# Patient Record
Sex: Male | Born: 1965
Health system: Southern US, Community
[De-identification: ages and names within clinical notes are randomized; demographics above are authoritative.]

## PROBLEM LIST (undated history)

## (undated) DIAGNOSIS — I10 Essential (primary) hypertension: Secondary | ICD-10-CM

## (undated) DIAGNOSIS — J45909 Unspecified asthma, uncomplicated: Secondary | ICD-10-CM

## (undated) DIAGNOSIS — I639 Cerebral infarction, unspecified: Secondary | ICD-10-CM

## (undated) HISTORY — DX: Cerebral infarction, unspecified: I63.9

---

## 2006-11-15 ENCOUNTER — Emergency Department (HOSPITAL_COMMUNITY): Admission: EM | Admit: 2006-11-15 | Discharge: 2006-11-15 | Payer: Self-pay | Admitting: Family Medicine

## 2015-03-15 ENCOUNTER — Emergency Department (HOSPITAL_COMMUNITY)
Admission: EM | Admit: 2015-03-15 | Discharge: 2015-03-15 | Disposition: A | Discharge status: Home / Self Care | Payer: Self-pay | Attending: Emergency Medicine | Admitting: Emergency Medicine

## 2015-03-15 ENCOUNTER — Encounter (HOSPITAL_COMMUNITY): Payer: Self-pay | Admitting: *Deleted

## 2015-03-15 DIAGNOSIS — W57XXXA Bitten or stung by nonvenomous insect and other nonvenomous arthropods, initial encounter: Secondary | ICD-10-CM | POA: Insufficient documentation

## 2015-03-15 DIAGNOSIS — Z720 Tobacco use: Secondary | ICD-10-CM | POA: Insufficient documentation

## 2015-03-15 DIAGNOSIS — L03115 Cellulitis of right lower limb: Secondary | ICD-10-CM | POA: Insufficient documentation

## 2015-03-15 DIAGNOSIS — Y9289 Other specified places as the place of occurrence of the external cause: Secondary | ICD-10-CM | POA: Insufficient documentation

## 2015-03-15 DIAGNOSIS — Y998 Other external cause status: Secondary | ICD-10-CM | POA: Insufficient documentation

## 2015-03-15 DIAGNOSIS — Y9389 Activity, other specified: Secondary | ICD-10-CM | POA: Insufficient documentation

## 2015-03-15 DIAGNOSIS — L02415 Cutaneous abscess of right lower limb: Secondary | ICD-10-CM | POA: Insufficient documentation

## 2015-03-15 MED ORDER — LIDOCAINE-EPINEPHRINE (PF) 2 %-1:200000 IJ SOLN
20.0000 mL | Freq: Once | INTRAMUSCULAR | Status: AC
  Administered 2015-03-15: 20 mL
  Filled 2015-03-15: qty 20

## 2015-03-15 MED ORDER — IBUPROFEN 800 MG PO TABS: 800.0000 mg | ORAL_TABLET | Freq: Three times a day (TID) | ORAL | Status: DC | PRN

## 2015-03-15 MED ORDER — DOXYCYCLINE HYCLATE 100 MG PO CAPS: 100.0000 mg | ORAL_CAPSULE | Freq: Two times a day (BID) | ORAL | Status: DC

## 2015-03-15 NOTE — ED Notes (Signed)
Declined W/C at D/C and was escorted to lobby by RN. 

## 2015-03-15 NOTE — ED Notes (Signed)
PT reports bite to RT posterior calf . Pt reports bite occurred 3 weeks ago and has not improved. Pt has an open wound to Rt posterior calf.

## 2015-03-15 NOTE — Discharge Instructions (Signed)
Read the information below.  Use the prescribed medication as directed.  Please discuss all new medications with your pharmacist.  You may return to the Emergency Department at any time for worsening condition or any new symptoms that concern you.    If you develop increased redness, swelling, uncontrolled pain, or fevers greater than 100.4, return to the ER immediately for a recheck.     Abscess An abscess is an infected area that contains a collection of pus and debris.It can occur in almost any part of the body. An abscess is also known as a furuncle or boil. CAUSES  An abscess occurs when tissue gets infected. This can occur from blockage of oil or sweat glands, infection of hair follicles, or a minor injury to the skin. As the body tries to fight the infection, pus collects in the area and creates pressure under the skin. This pressure causes pain. People with weakened immune systems have difficulty fighting infections and get certain abscesses more often.  SYMPTOMS Usually an abscess develops on the skin and becomes a painful mass that is red, warm, and tender. If the abscess forms under the skin, you may feel a moveable soft area under the skin. Some abscesses break open (rupture) on their own, but most will continue to get worse without care. The infection can spread deeper into the body and eventually into the bloodstream, causing you to feel ill.  DIAGNOSIS  Your caregiver will take your medical history and perform a physical exam. A sample of fluid may also be taken from the abscess to determine what is causing your infection. TREATMENT  Your caregiver may prescribe antibiotic medicines to fight the infection. However, taking antibiotics alone usually does not cure an abscess. Your caregiver may need to make a small cut (incision) in the abscess to drain the pus. In some cases, gauze is packed into the abscess to reduce pain and to continue draining the area. HOME CARE INSTRUCTIONS   Only  take over-the-counter or prescription medicines for pain, discomfort, or fever as directed by your caregiver.  If you were prescribed antibiotics, take them as directed. Finish them even if you start to feel better.  If gauze is used, follow your caregiver's directions for changing the gauze.  To avoid spreading the infection:  Keep your draining abscess covered with a bandage.  Wash your hands well.  Do not share personal care items, towels, or whirlpools with others.  Avoid skin contact with others.  Keep your skin and clothes clean around the abscess.  Keep all follow-up appointments as directed by your caregiver. SEEK MEDICAL CARE IF:   You have increased pain, swelling, redness, fluid drainage, or bleeding.  You have muscle aches, chills, or a general ill feeling.  You have a fever. MAKE SURE YOU:   Understand these instructions.  Will watch your condition.  Will get help right away if you are not doing well or get worse.   This information is not intended to replace advice given to you by your health care provider. Make sure you discuss any questions you have with your health care provider.   Document Released: 02/04/2005 Document Revised: 10/27/2011 Document Reviewed: 07/10/2011 Elsevier Interactive Patient Education 2016 Else[employer] Inc[employer]  Incision and Drainage Incision and drainage is a procedure in which a sac-like structure (cystic structure) is opened and drained. The area to be drained usually contains material such as pus, fluid, or blood.  LET YOUR CAREGIVER KNOW ABOUT:   Allergies to medicine.  Medicines taken, including vitamins, herbs, eyedrops, over-the-counter medicines, and creams. °· Use of steroids (by mouth or creams). °· Previous problems with anesthetics or numbing medicines. °· History of bleeding problems or blood clots. °· Previous surgery. °· Other health problems, including diabetes and kidney problems. °· Possibility of pregnancy, if this  applies. °RISKS AND COMPLICATIONS °· Pain. °· Bleeding. °· Scarring. °· Infection. °BEFORE THE PROCEDURE  °You may need to have an ultrasound or other imaging tests to see how large or deep your cystic structure is. Blood tests may also be used to determine if you have an infection or how severe the infection is. You may need to have a tetanus shot. °PROCEDURE  °The affected area is cleaned with a cleaning fluid. The cyst area will then be numbed with a medicine (local anesthetic). A small incision will be made in the cystic structure. A syringe or catheter may be used to drain the contents of the cystic structure, or the contents may be squeezed out. The area will then be flushed with a cleansing solution. After cleansing the area, it is often gently packed with a gauze or another wound dressing. Once it is packed, it will be covered with gauze and tape or some other type of wound dressing.  °AFTER THE PROCEDURE  °· Often, you will be allowed to go home right after the procedure. °· You may be given antibiotic medicine to prevent or heal an infection. °· If the area was packed with gauze or some other wound dressing, you will likely need to come back in 1 to 2 days to get it removed. °· The area should heal in about 14 days. °  °This information is not intended to replace advice given to you by your health care provider. Make sure you discuss any questions you have with your health care provider. °  °Document Released: 10/21/2000 Document Revised: 10/27/2011 Document Reviewed: 06/22/2011 °Elsevier Interactive Patient Education ©2016 Elsevier Inc. ° °

## 2015-03-15 NOTE — ED Provider Notes (Signed)
CSN: [id number]     Arrival date & time 03/15/15  1051 History  By signing my name below, I, Soijett Blue, attest that this documentation has been prepared under the direction and in the presence of Trixie Dredge, PA-C Electronically Signed: Soijett Blue, ED Scribe. 03/15/2015. 11:51 AM.   Chief Complaint  Patient presents with  . Insect Bite      The history is provided by the patient. No language interpreter was used.    Jesse Price is a 49 y.o. male who presents to the Emergency Department complaining of an insect bite to right posterior calf onset 3 weeks ago. Pt notes that he felt a bump to the area and he is unsure what bit him on his right posterior calf. He states that the area is painful and was initially flat, but the area is now raised with no drainage. He reports that he is UTD with his tetanus with the last one being 4-5 years ago. Pt is having associated symptoms of redness. Pt reports that he has tried epsom salt soak for the relief of his symptoms. Pt denies fever, chills, body aches, and any other symptoms.denies allergies to medications.  History reviewed. No pertinent past medical history. History reviewed. No pertinent past surgical history. History reviewed. No pertinent family history. Social History  Substance Use Topics  . Smoking status: Current Every Day Smoker -- 0.50 packs/day  . Smokeless tobacco: Never Used  . Alcohol Use: No    Review of Systems  Constitutional: Negative for fever and chills.  Musculoskeletal: Negative for myalgias.  Skin: Positive for color change and wound. Negative for rash.  Allergic/Immunologic: Negative for immunocompromised state.  Neurological: Negative for numbness.  Hematological: Does not bruise/bleed easily.  Psychiatric/Behavioral: Negative for self-injury.      Allergies  Review of patient's allergies indicates no known allergies.  Home Medications   Prior to Admission medications   Not on File   BP 142/106  mmHg  Pulse 76  Temp(Src) 98.8 F (37.1 C) (Oral)  Resp 18  SpO2 100% Physical Exam  Constitutional: He appears well-developed and well-nourished. No distress.  HENT:  Head: Normocephalic and atraumatic.  Neck: Neck supple.  Cardiovascular: Intact distal pulses.   Pulmonary/Chest: Effort normal.  Musculoskeletal: Normal range of motion.  Neurological: He is alert. He exhibits normal muscle tone.  Skin: He is not diaphoretic.  Right calf: central area of induration and slight fluctuance and surrounding erythema and TTP.  Psychiatric: He has a normal mood and affect.  Nursing note and vitals reviewed.   ED Course  Procedures (including critical care time) DIAGNOSTIC STUDIES: Oxygen Saturation is 100% on RA, nl by my interpretation.    COORDINATION OF CARE: 11:51 AM Discussed treatment plan with pt at bedside which includes I&D and pt agreed to plan.  INCISION AND DRAINAGE PROCEDURE NOTE: Patient identification was confirmed and verbal consent was obtained. This procedure was performed by Trixie Dredge, PA-C at 12:11 PM. Site: Right posterior calf Sterile procedures observed: Yes Needle size: 27 gauge Anesthetic used (type and amt): 2% Lidocaine with epinephrine and 4 cc used Blade size: 11 Drainage: moderate Complexity: Complex Packing used: No Site anesthetized, incision made over site, wound drained and explored loculations, rinsed with copious amounts of normal saline, wound packed with sterile gauze, covered with dry, sterile dressing.  Pt tolerated procedure well without complications.  Instructions for care discussed verbally and pt provided with additional written instructions for homecare and f/u.   Labs Review Labs  Reviewed - No data to display  Imaging Review No results found.    EKG Interpretation None      MDM   Final diagnoses:  Abscess of right lower leg    Afebrile, nontoxic patient with abscess right calf with localized cellulitis.  I&D in  ED.     D/C home with doxycycline, ibuprofen.   Discussed result, findings, treatment, and follow up  with patient.  Pt given return precautions.  Pt verbalizes understanding and agrees with plan.        I personally performed the services described in this documentation, which was scribed in my presence. The recorded information has been reviewed and is accurate.    Trixie Dredgemily Oluwatimileyin Vivier, PA-C 03/15/15 1643  Mirian MoMatthew Gentry, MD 03/21/15 [phone]

## 2017-03-10 ENCOUNTER — Encounter: Payer: Self-pay | Admitting: Medical

## 2017-03-10 ENCOUNTER — Ambulatory Visit (INDEPENDENT_AMBULATORY_CARE_PROVIDER_SITE_OTHER): Payer: PRIVATE HEALTH INSURANCE | Admitting: Medical

## 2017-03-10 ENCOUNTER — Encounter: Payer: Self-pay | Admitting: Family Medicine

## 2017-03-10 VITALS — BP 150/90 | HR 81 | Ht 66.0 in | Wt 168.0 lb

## 2017-03-10 DIAGNOSIS — I10 Essential (primary) hypertension: Secondary | ICD-10-CM | POA: Diagnosis not present

## 2017-03-10 DIAGNOSIS — F513 Sleepwalking [somnambulism]: Secondary | ICD-10-CM | POA: Diagnosis not present

## 2017-03-10 DIAGNOSIS — W19XXXA Unspecified fall, initial encounter: Secondary | ICD-10-CM

## 2017-03-10 DIAGNOSIS — Z23 Encounter for immunization: Secondary | ICD-10-CM | POA: Diagnosis not present

## 2017-03-10 DIAGNOSIS — L729 Follicular cyst of the skin and subcutaneous tissue, unspecified: Secondary | ICD-10-CM

## 2017-03-10 DIAGNOSIS — G479 Sleep disorder, unspecified: Secondary | ICD-10-CM | POA: Diagnosis not present

## 2017-03-10 DIAGNOSIS — S0121XA Laceration without foreign body of nose, initial encounter: Secondary | ICD-10-CM

## 2017-03-10 LAB — CBC WITH DIFFERENTIAL/PLATELET
BASOS PCT: 1 %
Basophils Absolute: 114 cells/uL (ref 0–200)
EOS PCT: 2.7 %
Eosinophils Absolute: 308 cells/uL (ref 15–500)
HCT: 44.1 % (ref 38.5–50.0)
HEMOGLOBIN: 15.3 g/dL (ref 13.2–17.1)
Lymphs Abs: 2497 cells/uL (ref 850–3900)
MCH: 30.5 pg (ref 27.0–33.0)
MCHC: 34.7 g/dL (ref 32.0–36.0)
MCV: 88 fL (ref 80.0–100.0)
MPV: 10.7 fL (ref 7.5–12.5)
Monocytes Relative: 12.3 %
NEUTROS ABS: 7079 {cells}/uL (ref 1500–7800)
Neutrophils Relative %: 62.1 %
PLATELETS: 395 10*3/uL (ref 140–400)
RBC: 5.01 10*6/uL (ref 4.20–5.80)
RDW: 12.7 % (ref 11.0–15.0)
TOTAL LYMPHOCYTE: 21.9 %
WBC mixed population: 1402 cells/uL — ABNORMAL HIGH (ref 200–950)
WBC: 11.4 10*3/uL — AB (ref 3.8–10.8)

## 2017-03-10 LAB — COMPREHENSIVE METABOLIC PANEL
AG Ratio: 1.8 (calc) (ref 1.0–2.5)
ALKALINE PHOSPHATASE (APISO): 50 U/L (ref 40–115)
ALT: 12 U/L (ref 9–46)
AST: 17 U/L (ref 10–35)
Albumin: 4.7 g/dL (ref 3.6–5.1)
BILIRUBIN TOTAL: 0.4 mg/dL (ref 0.2–1.2)
BUN/Creatinine Ratio: 8 (calc) (ref 6–22)
BUN: 13 mg/dL (ref 7–25)
CALCIUM: 10 mg/dL (ref 8.6–10.3)
CO2: 28 mmol/L (ref 20–32)
Chloride: 104 mmol/L (ref 98–110)
Creat: 1.6 mg/dL — ABNORMAL HIGH (ref 0.70–1.33)
GLUCOSE: 82 mg/dL (ref 65–99)
Globulin: 2.6 g/dL (calc) (ref 1.9–3.7)
Potassium: 5.5 mmol/L — ABNORMAL HIGH (ref 3.5–5.3)
Sodium: 141 mmol/L (ref 135–146)
Total Protein: 7.3 g/dL (ref 6.1–8.1)

## 2017-03-10 LAB — TSH: TSH: 0.48 m[IU]/L (ref 0.40–4.50)

## 2017-03-10 MED ORDER — MUPIROCIN 2 % EX OINT: 1.0000 "application " | TOPICAL_OINTMENT | Freq: Three times a day (TID) | CUTANEOUS | 0 refills | Status: DC

## 2017-03-10 NOTE — Patient Instructions (Signed)
Recommendations  We updated your tetanus diptheria pertussis vaccine today since you have a wound  Leave the outside of the nose alone and let it heal, except for the area above the wound.   Put mupirocin ointment on this twice daily  Don't remove the steri strip.  It will gradually fall off.  If the steri strip comes off sooner than a week then you can put another one across the wound to hold the wound closed  After 24 hours you can shower including the face/nose  Use a qtip to gently rub a little mupirocin ointment on the inside if the left nostril to help prevent infection and stop bleeding  If you are still having consistent bleeding from inside of nose by tomorrow mid morning, then recheck  We are checking some labs in light of your sleep disturbance  Avoid alcohol for now  Avoid stress or reduce stress  Avoid watching stressful tv such as news, horror or action films  Work on deep breathing exercises for 10 minutes prior to bedtime  If you have something on your mind before bedtime, journal or write it down  If your labs are normal, I will refer you to a sleep specialist or sleep study  I recommend you see a counselor to talk about stress and sleep issues  Plan to recheck in 1 week  Once we have some improvement in your sleep and nose issues, we can consider excision of the cyst on your posterior scalp

## 2017-03-10 NOTE — Progress Notes (Addendum)
Subjective: Chief Complaint  Patient presents with  . New Patient (Initial Visit)    knot on back of head x3 month , no pain , soft, has gotten bigger.    Here for new patient visit.  He has a few different c/o.  He notes lump on back of scalp for 2 moths that has gotten bigger.  He notes that it is mobile, but he doesn't like how it pokes ou particular after haircut.   Wants it removed  He notes for recent weeks to months has been having trouble with sleep.   He lives with his sister.  During the night while asleep has vivid dreams that he sometimes acts out in the dream and sister notes he sleep walks. At times he wakes up flailing and fighting in his sleep. One night he knocked over the tv.  He sometimes awakes and finds a mess where he was flailing around.  Last night he apparently fell out of bed during a night mare cutting his left nostril about 2am.  He notes ongoing seeping/bleeding from inside left nostril today.   Last tetanus shot unknown.  He notes no major stressors.  He works 50-60 hours per week.  Works in construction, steel worker.   He drinks about a 6pk alcohol on weekends.   He does note hx/o his 2yo daughter dying in her sleep years ago.  He has 2 other grown children.   No prior sleep evaluation.  Denies smoking, drugs, denies hallucinations.    No other aggravating or relieving factors. No other complaint.    Objective: BP (!) 150/90   Pulse 81   Ht 5\' 6"  (1.676 m)   Wt 168 lb (76.2 kg)   SpO2 99%   BMI 27.12 kg/m   General appearance: alert, no distress, WD/WN, lean AA male Skin: posterior scalp over occiput with 1cm round mobile mass c/w cyst. Left nare with 2cm linear wound/laceration that has approximated, slight erythema along the wound, abrasion just superior to this wound, inside left narea is a small 25mMozamWarren GeAL3NWG95[employer]343-6 C564m2MozamEast Metro EndoscAGreenAdNWG9(86240m)MozamKindred HospitalALarEncompass Health 7NWG95[employer]215-68 860m7MozamRehabilitation5NWG95[employer]858-83 70m8MozamCitrus Val4NWG95[employer]i81569m7MozamJefferson1NWG95[employer]i609235 Ea48m8MozamVadnais HeigNWG95[employer]857-884 S652m6Mozam8NWG95[employer]i281 69 L105m7MozamLarkin Community Hospital Behavioral HAWrNorthwest7NWG95[phone] Endoscopy 4NWG95[employer]i803-26 West 88m8MozamMemorial Hermann [facility]6NWG95[phone] N. N1032m6MozamGastrointest2NWG95[employer]i848-52m6Mozam3NWG95[employer]([phone])2852m5MozamHosp Metropo7NWG95[employer]i931529m1MozamPh[facility]5NWG95[employer]i4740538m3MozamR[facility]cre[facility] 4NWG95[employer]706-21 P640m1MozamVeADan4NWG95[employer]i4713535m9MozamSsm Health St. Mary'S HosA7NWG95[employer]i419-250m5MozamGeorgetown Behaviora3NWG95[employer]984-445m6MozaADoctors Cen9NWG95[employer]i269-9810 De7m5MozamPowell VAOlBaylor S3NWG95[employer]i240-29470m1MozamDigestive Disease Center Of Cen5NWG95[employer]i608410 W851m7MozamPiney OrcNWG95[employer]i272 8083 638m7MozamConnecticut Eye Surg3NWG95[employer]i613655m2MozamCentura Health-St ThomasAVaNWG95[employer]i724-5252m3MozamBozeman D3NWG95[employer]i423 636m1MozamMagee Ge3NWG95[employer]i906-685 466m2MozamPinnaclehealth CoASNWG95[phone] B[address] Laneals Of Southeast Texasping blood HEENT: normocephalic, sclerae anicteric, PERRLA, EOMi,  pharynx normal Oral cavity: MMM, no  lesions Neck: supple, no lymphadenopathy, no thyromegaly, no masses Heart: RRR, normal S1, S2, no murmurs Lungs: CTA bilaterally, no wheezes, rhonchi, or rales Extremities: no edema, no cyanosis, no clubbing Pulses: 2+ symmetric, upper and lower extremities, normal cap refill Neurological: alert, oriented x 3, CN2-12 intact, strength normal upper extremities and lower extremities, sensation normal throughout, DTRs 2+ throughout, no cerebellar signs, gait normal Psychiatric: normal affect, behavior normal, pleasant    Assessment: Encounter Diagnoses  Name Primary?  . Laceration of nose, initial encounter Yes  . Sleep walking   . Fall, initial encounter   . Need for Tdap vaccination   . Sleep disturbance   . Cyst of skin   . Elevated blood pressure reading with diagnosis of hypertension     Plan: Discussed his concerns  Counseled on the Tdap (tetanus, diptheria, and acellular pertussis) vaccine.  Vaccine information sheet given. Tdap vaccine given after consent obtained.  Cyst of skin - reassured nothing worrisome, but going forward we can plan excision  Discussed sleep disturbance, possible causes.  Labs today, but likely referral for other evaluation  Laceration of nose - cleaned wound, applied mupirocin to left nasal fold linear laceration and inside left nare, used steri strip to left nasal flare.   No obvious need for sutures or other treatment at this  time.  Patient Instructions  Recommendations  We updated your tetanus diptheria pertussis vaccine today since you have a wound  Leave the outside of the nose alone and let it heal, except for the area above the wound.   Put mupirocin ointment on this twice daily  Don't remove the steri strip.  It will gradually fall off.  If the steri strip comes off sooner than a week then you can put another one across the wound to hold the wound closed  After 24 hours you can shower including the face/nose  Use a qtip to gently rub a  little mupirocin ointment on the inside if the left nostril to help prevent infection and stop bleeding  If you are still having consistent bleeding from inside of nose by tomorrow mid morning, then recheck  We are checking some labs in light of your sleep disturbance  Avoid alcohol for now  Avoid stress or reduce stress  Avoid watching stressful tv such as news, horror or action films  Work on deep breathing exercises for 10 minutes prior to bedtime  If you have something on your mind before bedtime, journal or write it down  If your labs are normal, I will refer you to a sleep specialist or sleep study  I recommend you see a counselor to talk about stress and sleep issues  Plan to recheck in 1 week  Once we have some improvement in your sleep and nose issues, we can consider excision of the cyst on your posterior scalp    Casimiro NeedleMichael was seen today for new patient (initial visit).  Diagnoses and all orders for this visit:  Laceration of nose, initial encounter  Sleep walking -     Comprehensive metabolic panel -     CBC with Differential/Platelet -     TSH  Fall, initial encounter  Need for Tdap vaccination -     Tdap vaccine greater than or equal to 7yo IM  Sleep disturbance -     Comprehensive metabolic panel -     CBC with Differential/Platelet -     TSH  Cyst of skin  Elevated blood pressure reading with diagnosis of hypertension  Other orders -     mupirocin ointment (BACTROBAN) 2 %; Place 1 application into the nose 3 (three) times daily.

## 2017-03-17 ENCOUNTER — Ambulatory Visit: Payer: PRIVATE HEALTH INSURANCE | Admitting: Medical

## 2017-03-17 ENCOUNTER — Encounter: Payer: Self-pay | Admitting: Medical

## 2017-03-17 VITALS — BP 144/86 | HR 86 | Wt 174.0 lb

## 2017-03-17 DIAGNOSIS — I10 Essential (primary) hypertension: Secondary | ICD-10-CM | POA: Insufficient documentation

## 2017-03-17 DIAGNOSIS — E875 Hyperkalemia: Secondary | ICD-10-CM | POA: Diagnosis not present

## 2017-03-17 DIAGNOSIS — N289 Disorder of kidney and ureter, unspecified: Secondary | ICD-10-CM | POA: Diagnosis not present

## 2017-03-17 DIAGNOSIS — G479 Sleep disorder, unspecified: Secondary | ICD-10-CM | POA: Diagnosis not present

## 2017-03-17 DIAGNOSIS — F513 Sleepwalking [somnambulism]: Secondary | ICD-10-CM | POA: Insufficient documentation

## 2017-03-17 DIAGNOSIS — S0121XA Laceration without foreign body of nose, initial encounter: Secondary | ICD-10-CM | POA: Insufficient documentation

## 2017-03-17 DIAGNOSIS — L723 Sebaceous cyst: Secondary | ICD-10-CM | POA: Diagnosis not present

## 2017-03-17 MED ORDER — AMLODIPINE BESYLATE 5 MG PO TABS: 5.0000 mg | ORAL_TABLET | Freq: Every day | ORAL | 2 refills | Status: DC

## 2017-03-17 MED ORDER — ROPINIROLE HCL 0.5 MG PO TABS
0.5000 mg | ORAL_TABLET | Freq: Three times a day (TID) | ORAL | 1 refills | Status: DC
Start: 2017-03-17 — End: 2018-03-09

## 2017-03-17 NOTE — Progress Notes (Signed)
Subjective: Chief Complaint  Patient presents with  . Follow-up    follow up for wound on his nose    Here for 1 week f/u.  He was a new patient here last week.  Labs last visit showed abnormal creatinine and elevated potassium.  We also treated him for nasal laceration.    He denies hx/o HTN or abnormal kidney function. No chest pain,no dyspnea.   Goes to the bath room often at night.   No weak stream.  He notes lump on back of scalp for 2 moths that has gotten bigger.  He notes that it is mobile, but he doesn't like how it pokes ou particular after haircut.   Wants it removed  He notes for recent weeks to months has been having trouble with sleep.   He lives with his sister.  During the night while asleep has vivid dreams that he sometimes acts out in the dream and sister notes he sleep walks. At times he wakes up flailing and fighting in his sleep. One night he knocked over the tv.  He sometimes awakes and finds a mess where he was flailing around.  He notes no major stressors.  He works 50-60 hours per week.  Works in Holiday representativeconstruction, Haematologiststeel worker.   He drinks about a 6pk alcohol on weekends.    He does note hx/o his 2yo daughter dying in her sleep years ago.  He has 2 other grown children.   No prior sleep evaluation.  Denies smoking, drugs, denies hallucinations.    No other aggravating or relieving factors. No other complaint.  History reviewed. No pertinent past medical history.   Current Outpatient Medications on File Prior to Visit  Medication Sig Dispense Refill  . ibuprofen (ADVIL,MOTRIN) 800 MG tablet Take 1 tablet (800 mg total) by mouth every 8 (eight) hours as needed for mild pain or moderate pain. 15 tablet 0  . mupirocin ointment (BACTROBAN) 2 % Place 1 application into the nose 3 (three) times daily. 22 g 0   No current facility-administered medications on file prior to visit.     Objective: BP (!) 144/86   Pulse 86   Wt 174 lb (78.9 kg)   SpO2 98%   BMI 28.08  kg/m   General appearance: alert, no distress, WD/WN, lean AA male Skin: posterior scalp over occiput with 1cm round mobile mass c/w cyst.  Left nare with healing linear wound, healing appropriately compared to last visit HEENT: normocephalic, sclerae anicteric, PERRLA, EOMi,  pharynx normal Oral cavity: MMM, no lesions Neck: supple, no lymphadenopathy, no thyromegaly, no masses Heart: RRR, normal S1, S2, no murmurs Lungs: CTA bilaterally, no wheezes, rhonchi, or rales Extremities: no edema, no cyanosis, no clubbing Pulses: 2+ symmetric, upper and lower extremities, normal cap refill Neurological: alert, oriented x 3, CN2-12 intact, strength normal upper extremities and lower extremities, sensation normal throughout, DTRs 2+ throughout, no cerebellar signs, gait normal Psychiatric: normal affect, behavior normal, pleasant    Assessment: Encounter Diagnoses  Name Primary?  . Essential hypertension Yes  . Abnormal kidney function   . Hyperkalemia   . Sleep disturbance   . Sleep walking   . Sebaceous cyst   . Laceration of nose, initial encounter     Plan: Discussed his concerns  Cyst of skin - referral to dermatology  Discussed sleep disturbance, possible causes.  Begin trial of Requip for possible sleep movement disorder.  If not much improved in the coming weeks, then referral to neurology  HTN - new diagnosis.  Discussed risks of uncontrolled HTN. Begin trial of Amlodipine.  Discussed risks/benefits of medication  Abnormal creatinine likely due to long term uncontrolled HTN.  Plan repeat lab in 7mo, similarly hyperkalemia repeat lab next visit   There are no Patient Instructions on file for this visit.   Casimiro NeedleMichael was seen today for follow-up.  Diagnoses and all orders for this visit:  Essential hypertension  Abnormal kidney function  Hyperkalemia  Sleep disturbance  Sleep walking  Sebaceous cyst -     Ambulatory referral to Dermatology  Laceration of nose,  initial encounter  Other orders -     amLODipine (NORVASC) 5 MG tablet; Take 1 tablet (5 mg total) daily by mouth. -     rOPINIRole (REQUIP) 0.5 MG tablet; Take 1 tablet (0.5 mg total) 3 (three) times daily by mouth.

## 2017-08-15 ENCOUNTER — Other Ambulatory Visit: Payer: Self-pay | Admitting: Medical

## 2017-08-16 NOTE — Telephone Encounter (Signed)
Refill without refills and get in for CPX.

## 2017-08-16 NOTE — Telephone Encounter (Signed)
LOV- 03/17/17, no other appointments made. Okay to refill?

## 2017-08-16 NOTE — Telephone Encounter (Signed)
Called patient and advised he needed an appointment, left call back number to schedule

## 2018-03-05 ENCOUNTER — Emergency Department (HOSPITAL_COMMUNITY): Payer: Commercial Managed Care - PPO

## 2018-03-05 ENCOUNTER — Encounter (HOSPITAL_COMMUNITY): Payer: Self-pay | Admitting: Emergency Medicine

## 2018-03-05 ENCOUNTER — Observation Stay (HOSPITAL_COMMUNITY): Payer: Commercial Managed Care - PPO

## 2018-03-05 ENCOUNTER — Inpatient Hospital Stay (HOSPITAL_COMMUNITY)
Admission: EM | Admit: 2018-03-05 | Discharge: 2018-03-09 | DRG: 040 | Disposition: A | Discharge status: Rehabilitation Facility | Payer: Commercial Managed Care - PPO | Attending: Internal Medicine | Admitting: Internal Medicine

## 2018-03-05 DIAGNOSIS — R269 Unspecified abnormalities of gait and mobility: Secondary | ICD-10-CM | POA: Diagnosis not present

## 2018-03-05 DIAGNOSIS — F1020 Alcohol dependence, uncomplicated: Secondary | ICD-10-CM | POA: Diagnosis not present

## 2018-03-05 DIAGNOSIS — I6340 Cerebral infarction due to embolism of unspecified cerebral artery: Secondary | ICD-10-CM | POA: Diagnosis not present

## 2018-03-05 DIAGNOSIS — R9431 Abnormal electrocardiogram [ECG] [EKG]: Secondary | ICD-10-CM | POA: Diagnosis not present

## 2018-03-05 DIAGNOSIS — I10 Essential (primary) hypertension: Secondary | ICD-10-CM | POA: Diagnosis present

## 2018-03-05 DIAGNOSIS — F17210 Nicotine dependence, cigarettes, uncomplicated: Secondary | ICD-10-CM | POA: Diagnosis present

## 2018-03-05 DIAGNOSIS — Z79899 Other long term (current) drug therapy: Secondary | ICD-10-CM

## 2018-03-05 DIAGNOSIS — E785 Hyperlipidemia, unspecified: Secondary | ICD-10-CM

## 2018-03-05 DIAGNOSIS — Z8249 Family history of ischemic heart disease and other diseases of the circulatory system: Secondary | ICD-10-CM

## 2018-03-05 DIAGNOSIS — D72829 Elevated white blood cell count, unspecified: Secondary | ICD-10-CM | POA: Diagnosis not present

## 2018-03-05 DIAGNOSIS — I63421 Cerebral infarction due to embolism of right anterior cerebral artery: Secondary | ICD-10-CM | POA: Diagnosis not present

## 2018-03-05 DIAGNOSIS — I5189 Other ill-defined heart diseases: Secondary | ICD-10-CM | POA: Diagnosis not present

## 2018-03-05 DIAGNOSIS — R29706 NIHSS score 6: Secondary | ICD-10-CM | POA: Diagnosis not present

## 2018-03-05 DIAGNOSIS — I6389 Other cerebral infarction: Secondary | ICD-10-CM | POA: Diagnosis not present

## 2018-03-05 DIAGNOSIS — I69398 Other sequelae of cerebral infarction: Secondary | ICD-10-CM | POA: Diagnosis not present

## 2018-03-05 DIAGNOSIS — Z720 Tobacco use: Secondary | ICD-10-CM

## 2018-03-05 DIAGNOSIS — I081 Rheumatic disorders of both mitral and tricuspid valves: Secondary | ICD-10-CM | POA: Diagnosis not present

## 2018-03-05 DIAGNOSIS — I6310 Cerebral infarction due to embolism of unspecified precerebral artery: Secondary | ICD-10-CM | POA: Diagnosis not present

## 2018-03-05 DIAGNOSIS — F1010 Alcohol abuse, uncomplicated: Secondary | ICD-10-CM | POA: Diagnosis present

## 2018-03-05 DIAGNOSIS — N182 Chronic kidney disease, stage 2 (mild): Secondary | ICD-10-CM | POA: Diagnosis present

## 2018-03-05 DIAGNOSIS — S0003XA Contusion of scalp, initial encounter: Secondary | ICD-10-CM | POA: Diagnosis present

## 2018-03-05 DIAGNOSIS — W19XXXA Unspecified fall, initial encounter: Secondary | ICD-10-CM | POA: Diagnosis present

## 2018-03-05 DIAGNOSIS — R531 Weakness: Secondary | ICD-10-CM | POA: Diagnosis not present

## 2018-03-05 DIAGNOSIS — R29708 NIHSS score 8: Secondary | ICD-10-CM | POA: Diagnosis present

## 2018-03-05 DIAGNOSIS — G936 Cerebral edema: Secondary | ICD-10-CM | POA: Diagnosis present

## 2018-03-05 DIAGNOSIS — G9389 Other specified disorders of brain: Secondary | ICD-10-CM | POA: Diagnosis not present

## 2018-03-05 DIAGNOSIS — G8194 Hemiplegia, unspecified affecting left nondominant side: Secondary | ICD-10-CM | POA: Diagnosis not present

## 2018-03-05 DIAGNOSIS — M6281 Muscle weakness (generalized): Secondary | ICD-10-CM | POA: Diagnosis not present

## 2018-03-05 DIAGNOSIS — I129 Hypertensive chronic kidney disease with stage 1 through stage 4 chronic kidney disease, or unspecified chronic kidney disease: Secondary | ICD-10-CM | POA: Diagnosis present

## 2018-03-05 DIAGNOSIS — G8114 Spastic hemiplegia affecting left nondominant side: Secondary | ICD-10-CM | POA: Diagnosis not present

## 2018-03-05 DIAGNOSIS — I639 Cerebral infarction, unspecified: Secondary | ICD-10-CM | POA: Diagnosis present

## 2018-03-05 DIAGNOSIS — I63233 Cerebral infarction due to unspecified occlusion or stenosis of bilateral carotid arteries: Secondary | ICD-10-CM | POA: Diagnosis not present

## 2018-03-05 DIAGNOSIS — I340 Nonrheumatic mitral (valve) insufficiency: Secondary | ICD-10-CM | POA: Diagnosis not present

## 2018-03-05 DIAGNOSIS — R29705 NIHSS score 5: Secondary | ICD-10-CM | POA: Diagnosis not present

## 2018-03-05 DIAGNOSIS — R29704 NIHSS score 4: Secondary | ICD-10-CM | POA: Diagnosis not present

## 2018-03-05 DIAGNOSIS — I5030 Unspecified diastolic (congestive) heart failure: Secondary | ICD-10-CM | POA: Diagnosis not present

## 2018-03-05 DIAGNOSIS — I371 Nonrheumatic pulmonary valve insufficiency: Secondary | ICD-10-CM | POA: Diagnosis not present

## 2018-03-05 DIAGNOSIS — J45909 Unspecified asthma, uncomplicated: Secondary | ICD-10-CM | POA: Diagnosis present

## 2018-03-05 HISTORY — DX: Essential (primary) hypertension: I10

## 2018-03-05 HISTORY — DX: Unspecified asthma, uncomplicated: J45.909

## 2018-03-05 LAB — I-STAT CHEM 8, ED
BUN: 16 mg/dL (ref 6–20)
CREATININE: 1.6 mg/dL — AB (ref 0.61–1.24)
Calcium, Ion: 1.13 mmol/L — ABNORMAL LOW (ref 1.15–1.40)
Chloride: 107 mmol/L (ref 98–111)
GLUCOSE: 97 mg/dL (ref 70–99)
HCT: 49 % (ref 39.0–52.0)
Hemoglobin: 16.7 g/dL (ref 13.0–17.0)
POTASSIUM: 4.2 mmol/L (ref 3.5–5.1)
Sodium: 147 mmol/L — ABNORMAL HIGH (ref 135–145)
TCO2: 29 mmol/L (ref 22–32)

## 2018-03-05 LAB — CBC
HEMATOCRIT: 49.2 % (ref 39.0–52.0)
Hemoglobin: 15.9 g/dL (ref 13.0–17.0)
MCH: 30.6 pg (ref 26.0–34.0)
MCHC: 32.3 g/dL (ref 30.0–36.0)
MCV: 94.6 fL (ref 80.0–100.0)
NRBC: 0 % (ref 0.0–0.2)
PLATELETS: 397 10*3/uL (ref 150–400)
RBC: 5.2 MIL/uL (ref 4.22–5.81)
RDW: 13.2 % (ref 11.5–15.5)
WBC: 12.2 10*3/uL — AB (ref 4.0–10.5)

## 2018-03-05 LAB — COMPREHENSIVE METABOLIC PANEL
ALT: 19 U/L (ref 0–44)
AST: 32 U/L (ref 15–41)
Albumin: 4.7 g/dL (ref 3.5–5.0)
Alkaline Phosphatase: 57 U/L (ref 38–126)
Anion gap: 9 (ref 5–15)
BILIRUBIN TOTAL: 0.6 mg/dL (ref 0.3–1.2)
BUN: 13 mg/dL (ref 6–20)
CHLORIDE: 108 mmol/L (ref 98–111)
CO2: 27 mmol/L (ref 22–32)
CREATININE: 1.33 mg/dL — AB (ref 0.61–1.24)
Calcium: 9.3 mg/dL (ref 8.9–10.3)
GFR calc Af Amer: >60 mL/min (ref >60)
GFR calc non Af Amer: 60 mL/min — ABNORMAL LOW (ref >60)
Glucose, Bld: 100 mg/dL — ABNORMAL HIGH (ref 70–99)
Potassium: 4 mmol/L (ref 3.5–5.1)
Sodium: 144 mmol/L (ref 135–145)
TOTAL PROTEIN: 7.6 g/dL (ref 6.5–8.1)

## 2018-03-05 LAB — APTT: aPTT: 30 seconds (ref 24–36)

## 2018-03-05 LAB — DIFFERENTIAL
Abs Immature Granulocytes: 0.05 10*3/uL (ref 0.00–0.07)
Basophils Absolute: 0.1 10*3/uL (ref 0.0–0.1)
Basophils Relative: 1 %
Eosinophils Absolute: 0.4 10*3/uL (ref 0.0–0.5)
Eosinophils Relative: 3 %
Immature Granulocytes: 0 %
LYMPHS ABS: 4 10*3/uL (ref 0.7–4.0)
LYMPHS PCT: 33 %
Monocytes Absolute: 1 10*3/uL (ref 0.1–1.0)
Monocytes Relative: 8 %
NEUTROS ABS: 6.6 10*3/uL (ref 1.7–7.7)
Neutrophils Relative %: 55 %

## 2018-03-05 LAB — PROTIME-INR
INR: 0.97
Prothrombin Time: 12.8 seconds (ref 11.4–15.2)

## 2018-03-05 LAB — I-STAT TROPONIN, ED: Troponin i, poc: 0.01 ng/mL (ref 0.00–0.08)

## 2018-03-05 MED ORDER — SENNOSIDES-DOCUSATE SODIUM 8.6-50 MG PO TABS: 1.0000 | ORAL_TABLET | Freq: Every evening | ORAL | Status: DC | PRN

## 2018-03-05 MED ORDER — ACETAMINOPHEN 325 MG PO TABS
650.0000 mg | ORAL_TABLET | ORAL | Status: DC | PRN
  Filled 2018-03-05: qty 2

## 2018-03-05 MED ORDER — SODIUM CHLORIDE 0.9 % IV SOLN
INTRAVENOUS | Status: DC
  Administered 2018-03-06 (×2): via INTRAVENOUS

## 2018-03-05 MED ORDER — LORAZEPAM 1 MG PO TABS: 1.0000 mg | ORAL_TABLET | Freq: Four times a day (QID) | ORAL | Status: AC | PRN

## 2018-03-05 MED ORDER — ENOXAPARIN SODIUM 40 MG/0.4ML ~~LOC~~ SOLN
40.0000 mg | Freq: Every day | SUBCUTANEOUS | Status: DC
  Administered 2018-03-09: 40 mg via SUBCUTANEOUS
  Filled 2018-03-05 (×4): qty 0.4

## 2018-03-05 MED ORDER — THIAMINE HCL 100 MG/ML IJ SOLN: 100.0000 mg | Freq: Every day | INTRAMUSCULAR | Status: DC

## 2018-03-05 MED ORDER — LORAZEPAM 2 MG/ML IJ SOLN
0.0000 mg | Freq: Four times a day (QID) | INTRAMUSCULAR | Status: AC
  Administered 2018-03-06: 2 mg via INTRAVENOUS
  Filled 2018-03-05: qty 1

## 2018-03-05 MED ORDER — ADULT MULTIVITAMIN W/MINERALS CH
1.0000 | ORAL_TABLET | Freq: Every day | ORAL | Status: DC
  Administered 2018-03-06 – 2018-03-09 (×3): 1 via ORAL
  Filled 2018-03-05 (×3): qty 1

## 2018-03-05 MED ORDER — LABETALOL HCL 5 MG/ML IV SOLN: 5.0000 mg | INTRAVENOUS | Status: DC | PRN

## 2018-03-05 MED ORDER — ATORVASTATIN CALCIUM 80 MG PO TABS
80.0000 mg | ORAL_TABLET | Freq: Every day | ORAL | Status: DC
  Administered 2018-03-06 – 2018-03-08 (×3): 80 mg via ORAL
  Filled 2018-03-05 (×3): qty 1

## 2018-03-05 MED ORDER — ASPIRIN 325 MG PO TABS
325.0000 mg | ORAL_TABLET | Freq: Every day | ORAL | Status: DC
  Administered 2018-03-06 – 2018-03-09 (×4): 325 mg via ORAL
  Filled 2018-03-05 (×4): qty 1

## 2018-03-05 MED ORDER — STROKE: EARLY STAGES OF RECOVERY BOOK
Freq: Once | Status: AC
  Administered 2018-03-06: 02:00:00
  Filled 2018-03-05: qty 1

## 2018-03-05 MED ORDER — ACETAMINOPHEN 160 MG/5ML PO SOLN: 650.0000 mg | ORAL | Status: DC | PRN

## 2018-03-05 MED ORDER — FOLIC ACID 1 MG PO TABS
1.0000 mg | ORAL_TABLET | Freq: Every day | ORAL | Status: DC
  Administered 2018-03-06 – 2018-03-09 (×3): 1 mg via ORAL
  Filled 2018-03-05 (×3): qty 1

## 2018-03-05 MED ORDER — LORAZEPAM 2 MG/ML IJ SOLN: 0.0000 mg | Freq: Two times a day (BID) | INTRAMUSCULAR | Status: DC

## 2018-03-05 MED ORDER — ACETAMINOPHEN 650 MG RE SUPP: 650.0000 mg | RECTAL | Status: DC | PRN

## 2018-03-05 MED ORDER — VITAMIN B-1 100 MG PO TABS
100.0000 mg | ORAL_TABLET | Freq: Every day | ORAL | Status: DC
  Administered 2018-03-06 – 2018-03-09 (×3): 100 mg via ORAL
  Filled 2018-03-05 (×3): qty 1

## 2018-03-05 MED ORDER — LORAZEPAM 2 MG/ML IJ SOLN: 1.0000 mg | Freq: Four times a day (QID) | INTRAMUSCULAR | Status: AC | PRN

## 2018-03-05 MED ORDER — ASPIRIN 300 MG RE SUPP: 300.0000 mg | Freq: Every day | RECTAL | Status: DC

## 2018-03-05 NOTE — H&P (Signed)
History and Physical    Jesse Price JXB:[id number] DOB: 1965/10/25 DOA: 03/05/2018  PCP: Jac Canavan, PA-C   Patient coming from: Home   Chief Complaint: Left-sided weakness   HPI: Jesse Price is a left-handed 52 y.o. male with medical history significant for hypertension, tobacco abuse, chronic kidney disease stage II, and excessive daily alcohol use, now presenting to the emergency department for evaluation of weakness involving his left arm and leg.  The patient reports that he was in his usual state of health until the evening of 03/04/2018 when he developed acute onset of weakness involving the left arm and leg.  He reports falling to the ground due to the leg weakness.  Symptoms have been essentially unchanged since that time.  He denies any headache, change in vision or hearing, or difficulty swallowing.  He denies chest pain or palpitations.  No lower extremity swelling or tenderness.  He has never experienced this previously.  He has been prescribed Norvasc in April, but has not been taking it.  ED Course: Upon arrival to the ED, patient is found to be afebrile, saturating well on room air, slightly hypertensive, and with vitals otherwise normal.  EKG features a sinus rhythm with nonspecific ST and T wave abnormalities.  CT head is concerning for cytotoxic edema in the mid right frontal lobe suggestive of infarction without any mass or hemorrhage identified.  Chemistry panel is notable for creatinine 1.33, similar to prior.  CBC features a leukocytosis to 12,200.  Neurology was consulted by the ED physician and recommends a medical admission for further evaluation of acute ischemic CVA.  Review of Systems:  All other systems reviewed and apart from HPI, are negative.  Past Medical History:  Diagnosis Date  . Asthma   . Hypertension     History reviewed. No pertinent surgical history.   reports that he has been smoking cigarettes. He has been smoking about 0.50 packs  per day. He has never used smokeless tobacco. He reports that he drinks about 28.0 standard drinks of alcohol per week. He reports that he does not use drugs.  No Known Allergies  Family History  Problem Relation Age of Onset  . Cancer Mother        brain  . Other Father        died in fire  . Hypertension Sister   . Hypertension Brother   . Cancer Brother        back  . Hypertension Sister   . Hypertension Sister   . Heart disease Neg Hx   . Stroke Neg Hx      Prior to Admission medications   Medication Sig Start Date End Date Taking? Authorizing Provider  amLODipine (NORVASC) 5 MG tablet TAKE 1 TABLET BY MOUTH ONCE DAILY Patient taking differently: Take 5 mg by mouth daily.  08/16/17   Tysinger, Kermit Balo, PA-C  ibuprofen (ADVIL,MOTRIN) 800 MG tablet Take 1 tablet (800 mg total) by mouth every 8 (eight) hours as needed for mild pain or moderate pain. Patient not taking: Reported on 03/05/2018 03/15/15   Trixie Dredge, PA-C  mupirocin ointment (BACTROBAN) 2 % Place 1 application into the nose 3 (three) times daily. Patient not taking: Reported on 03/05/2018 03/10/17   Tysinger, Kermit Balo, PA-C  rOPINIRole (REQUIP) 0.5 MG tablet Take 1 tablet (0.5 mg total) 3 (three) times daily by mouth. Patient not taking: Reported on 03/05/2018 03/17/17   Jac Canavan, PA-C    Physical Exam: Vitals:  03/05/18 1916 03/05/18 1919 03/05/18 2141  BP:  (!) 144/89 (!) 154/95  Pulse:  98 86  Resp:  16 10  Temp:  98.2 F (36.8 C)   TempSrc:  Oral   SpO2:  100% 100%  Weight: 74.8 kg    Height: 5\' 6"  (1.676 m)      Constitutional: NAD, calm  Eyes: PERTLA, lids and conjunctivae normal ENMT: Mucous membranes are moist. Posterior pharynx clear of any exudate or lesions.   Neck: normal, supple, no masses, no thyromegaly Respiratory: clear to auscultation bilaterally, no wheezing, no crackles. Normal respiratory effort.    Cardiovascular: S1 & S2 heard, regular rate and rhythm. No extremity edema.   Abdomen: No distension, soft. Bowel sounds active.  Musculoskeletal: no clubbing / cyanosis. No joint deformity upper and lower extremities.    Skin: no significant rashes, lesions, ulcers. Warm, dry, well-perfused. Neurologic: CN 2-12 grossly intact. Sensation to light touch diminished to LUE and LLE.  Strength 0/5 in LUE, 2/5 in LLE.  Psychiatric: Alert and oriented x 3. Pleasant and cooperative.    Labs on Admission: I have personally reviewed following labs and imaging studies  CBC: Recent Labs  Lab 03/05/18 1948 03/05/18 2008  WBC 12.2*  --   NEUTROABS 6.6  --   HGB 15.9 16.7  HCT 49.2 49.0  MCV 94.6  --   PLT 397  --    Basic Metabolic Panel: Recent Labs  Lab 03/05/18 1948 03/05/18 2008  NA 144 147*  K 4.0 4.2  CL 108 107  CO2 27  --   GLUCOSE 100* 97  BUN 13 16  CREATININE 1.33* 1.60*  CALCIUM 9.3  --    GFR: Estimated Creatinine Clearance: 48.2 mL/min (A) (by C-G formula based on SCr of 1.6 mg/dL (H)). Liver Function Tests: Recent Labs  Lab 03/05/18 1948  AST 32  ALT 19  ALKPHOS 57  BILITOT 0.6  PROT 7.6  ALBUMIN 4.7   No results for input(s): LIPASE, AMYLASE in the last 168 hours. No results for input(s): AMMONIA in the last 168 hours. Coagulation Profile: Recent Labs  Lab 03/05/18 1948  INR 0.97   Cardiac Enzymes: No results for input(s): CKTOTAL, CKMB, CKMBINDEX, TROPONINI in the last 168 hours. BNP (last 3 results) No results for input(s): PROBNP in the last 8760 hours. HbA1C: No results for input(s): HGBA1C in the last 72 hours. CBG: No results for input(s): GLUCAP in the last 168 hours. Lipid Profile: No results for input(s): CHOL, HDL, LDLCALC, TRIG, CHOLHDL, LDLDIRECT in the last 72 hours. Thyroid Function Tests: No results for input(s): TSH, T4TOTAL, FREET4, T3FREE, THYROIDAB in the last 72 hours. Anemia Panel: No results for input(s): VITAMINB12, FOLATE, FERRITIN, TIBC, IRON, RETICCTPCT in the last 72 hours. Urine analysis: No  results found for: COLORURINE, APPEARANCEUR, LABSPEC, PHURINE, GLUCOSEU, HGBUR, BILIRUBINUR, KETONESUR, PROTEINUR, UROBILINOGEN, NITRITE, LEUKOCYTESUR Sepsis Labs: @LABRCNTIP (procalcitonin:4,lacticidven:4) )No results found for this or any previous visit (from the past 240 hour(s)).   Radiological Exams on Admission: Ct Head Wo Contrast  Result Date: 03/05/2018 CLINICAL DATA:  Left upper and lower extremity weakness for approximately 24 hours EXAM: CT HEAD WITHOUT CONTRAST TECHNIQUE: Contiguous axial images were obtained from the base of the skull through the vertex without intravenous contrast. COMPARISON:  None. FINDINGS: Brain: The ventricles are normal in size and duration. There is no demonstrable mass, hemorrhage, extra-axial fluid collection, or midline shift. There is evidence of localized encephalomalacia in the mid right frontal lobe, likely due to prior  infarct. Adjacent to this encephalomalacia, there Is apparent cytotoxic edema at the gray-white junction of the right mid frontal lobe, suspicious for recent and probably acute infarct adjacent to an area of encephalomalacia. This edema extends into the anterior superior right centrum semiovale, felt to be part of this recent, likely acute infarct. Elsewhere, the brain parenchyma appears unremarkable. Vascular: No evident hyperdense vessel. There is calcification in each carotid siphon region. Skull: Bony calvarium appears intact. Sinuses/Orbits: There is mucosal thickening in several ethmoid air cells. Other visualized paranasal sinuses are clear. Visualized orbits appear symmetric bilaterally. Other: Mastoid air cells are clear. IMPRESSION: 1. Cytotoxic edema in the mid right frontal lobe extending into the adjacent anterior superior right centrum semiovale. This appearance is felt to represent probable acute infarct immediately adjacent to an older infarct with encephalomalacia in the periphery of the mid right frontal lobe. No other evident  acute infarct. No mass or hemorrhage. Brain parenchyma otherwise appears unremarkable. 2.  There is mucosal thickening in several ethmoid air cells. Electronically Signed   By: Bretta Bang III M.D.   On: 03/05/2018 19:48    EKG: Independently reviewed. Sinus rhythm, non-specific ST and T abnormality.   Assessment/Plan   1. Acute CVA  - Presents with ~24 hours after after acute-onset of flaccid left arm and weakness in left leg  - CT concerning for right frontal infarct  - Presents outside of timeframe for tPA  - Neurology is consulting and much appreciated  - Continue cardiac monitoring with frequent neuro checks, PT/OT/SLP evals, and keep NPO pending swallow screen  - Check MRI brain, CTA head and neck, echo, fasting lipids, and A1c  - Start ASA 325 mg and atorvastatin 80 mg    2. Hypertension  - Prescribed Norvasc, but has not been taking   - Permit HTN in acute-phase ischemic CVA and then lower to SBP <140  3. CKD stage II  - SCr is 1.33 on admission, similar to prior  - Likely secondary to untreated HTN  - Renally-dose medications     4. Alcohol, tobacco abuse  - Counseled  - Does not appear to be intoxicated or withdrawing on admission  - Monitor with CIWA, use Ativan as-needed for alcohol withdrawal, supplement vitamins    DVT prophylaxis: Lovenox Code Status: Full  Family Communication: Sister updated at bedside Consults called: Neurology Admission status: Observation     Briscoe Deutscher, MD [facility] Pager [phone]  If 7PM-7AM, please contact night-coverage [email or web address] Password [email or web address]  03/05/2018, 10:45 PM

## 2018-03-05 NOTE — ED Triage Notes (Signed)
Pt presents with unilateral (L) weakness to arm and leg that began Friday 5:30-6p; pt also states he is unable to walk and falls to the L because of weakness; no facial droop, no speech abnormality, no visual changes

## 2018-03-05 NOTE — Consult Note (Signed)
Neurology Consultation  Reason for Consult: Acute onset of left-sided flaccid weakness in the arm and leg Referring Physician: Dr. Clarice Pole  CC: Left-sided weakness  History is obtained from: Patient, chart  HPI: Jesse Price is a 52 y.o. male past medical history of hypertension, not on any antihypertensives at this time, was in usual state of health yesterday and the last known normal around 5:30 PM on 03/04/2018 when he had sudden onset of left arm and leg weakness.  He said he was so weak that he could not move or even get his phone to ask for help.  He was on the ground for a few hours before his wife could get him to the hospital she was out of town and return to just this evening. He denies having had any preceding illnesses like fevers chills nausea vomiting headaches or visual symptoms. He has not had any stroke or strokelike symptoms in the past. He is left-handed, welder by occupation. He was diagnosed with hypertension last year and his doctor had recommended that he try amlodipine but he never continued the treatment with amlodipine. He currently abuses tobacco-smokes pack cigarettes a day. Denies any illicit drug use.  His wife brought him to the ER this evening.  He was evaluated in triage and sent for noncontrast CT of the head which is concerning for a right-sided stroke.  Neurology was asked to see him in consultation for the acute left-sided weakness and abnormal CT scan.  LKW: 5:30 PM on 03/04/2018 tpa given?: no, outside the window Premorbid modified Rankin scale (mRS): 0   ROS:ROS was performed and is negative except as noted in the HPI.  Past Medical History:  Diagnosis Date  . Asthma   . Hypertension     Family History  Problem Relation Age of Onset  . Cancer Mother        brain  . Other Father        died in fire  . Hypertension Sister   . Hypertension Brother   . Cancer Brother        back  . Hypertension Sister   . Hypertension Sister   .  Heart disease Neg Hx   . Stroke Neg Hx     Social History:   reports that he has been smoking cigarettes. He has been smoking about 0.50 packs per day. He has never used smokeless tobacco. He reports that he drinks about 1.0 standard drinks of alcohol per week. He reports that he does not use drugs.  Medications No current facility-administered medications for this encounter.   Current Outpatient Medications:  .  amLODipine (NORVASC) 5 MG tablet, TAKE 1 TABLET BY MOUTH ONCE DAILY, Disp: 30 tablet, Rfl: 0 .  ibuprofen (ADVIL,MOTRIN) 800 MG tablet, Take 1 tablet (800 mg total) by mouth every 8 (eight) hours as needed for mild pain or moderate pain., Disp: 15 tablet, Rfl: 0 .  mupirocin ointment (BACTROBAN) 2 %, Place 1 application into the nose 3 (three) times daily., Disp: 22 g, Rfl: 0 .  rOPINIRole (REQUIP) 0.5 MG tablet, Take 1 tablet (0.5 mg total) 3 (three) times daily by mouth., Disp: 30 tablet, Rfl: 1  Not taking any medications at home.  Exam: Current vital signs: BP (!) 154/95 (BP Location: Right Arm)   Pulse 86   Temp 98.2 F (36.8 C) (Oral)   Resp 10   Ht 5\' 6"  (1.676 m)   Wt 74.8 kg   SpO2 100%   BMI 26.63 kg/m  Vital signs in last 24 hours: Temp:  [98.2 F (36.8 C)] 98.2 F (36.8 C) (10/26 1919) Pulse Rate:  [86-98] 86 (10/26 2141) Resp:  [10-16] 10 (10/26 2141) BP: (144-154)/(89-95) 154/95 (10/26 2141) SpO2:  [100 %] 100 % (10/26 2141) Weight:  [74.8 kg] 74.8 kg (10/26 1916)  GENERAL: Awake, alert in NAD HEENT: - Normocephalic and atraumatic, dry mm, no LN++, no Thyromegally LUNGS - Clear to auscultation bilaterally with no wheezes CV - S1S2 RRR, no m/r/g, equal pulses bilaterally. ABDOMEN - Soft, nontender, nondistended with normoactive BS Ext: warm, well perfused, intact peripheral pulses, no edema  NEURO:  Mental Status: AA&Ox3  Language: speech is not dysarthric.  Naming, repetition, fluency, and comprehension intact. Cranial Nerves: PERRL. EOMI,  visual fields full, subtle flattening of the left nasolabial fold. facial sensation intact, hearing intact, tongue/uvula/soft palate midline, normal sternocleidomastoid and trapezius muscle strength. No evidence of tongue atrophy or fibrillations Motor: Left upper extremity is flaccid 0/5.  Left lower extremity is barely 4/5 with some drift.  Right upper and lower extremities no drift.  5/5 in strength. Tone:  flaccid left upper extremity, otherwise normal Sensation-severely decreased sensation on the left arm compared to the right.  Sensation is preserved in lower extremities and is not different between right and left. Coordination: FTN intact on the right.  Unable to perform on the left. Gait- deferred  NIHSS 1a Level of Conscious.: 0 1b LOC Questions: 0 1c LOC Commands: 0 2 Best Gaze: 0 3 Visual: 0 4 Facial Palsy: 1 5a Motor Arm - left: 4 5b Motor Arm - Right: 0 6a Motor Leg - Left: 1 6b Motor Leg - Right: 0 7 Limb Ataxia: 0 8 Sensory: 2 9 Best Language: 0 10 Dysarthria: 0 11 Extinct. and Inatten.: 0 TOTAL: 8  Labs I have reviewed labs in epic and the results pertinent to this consultation are:  CBC    Component Value Date/Time   WBC 12.2 (H) 03/05/2018 1948   RBC 5.20 03/05/2018 1948   HGB 16.7 03/05/2018 2008   HCT 49.0 03/05/2018 2008   PLT 397 03/05/2018 1948   MCV 94.6 03/05/2018 1948   MCH 30.6 03/05/2018 1948   MCHC 32.3 03/05/2018 1948   RDW 13.2 03/05/2018 1948   LYMPHSABS 4.0 03/05/2018 1948   MONOABS 1.0 03/05/2018 1948   EOSABS 0.4 03/05/2018 1948   BASOSABS 0.1 03/05/2018 1948    CMP     Component Value Date/Time   NA 147 (H) 03/05/2018 2008   K 4.2 03/05/2018 2008   CL 107 03/05/2018 2008   CO2 27 03/05/2018 1948   GLUCOSE 97 03/05/2018 2008   BUN 16 03/05/2018 2008   CREATININE 1.60 (H) 03/05/2018 2008   CREATININE 1.60 (H) 03/10/2017 1640   CALCIUM 9.3 03/05/2018 1948   PROT 7.6 03/05/2018 1948   ALBUMIN 4.7 03/05/2018 1948   AST 32  03/05/2018 1948   ALT 19 03/05/2018 1948   ALKPHOS 57 03/05/2018 1948   BILITOT 0.6 03/05/2018 1948   GFRNONAA 60 (L) 03/05/2018 1948   GFRAA >60 03/05/2018 1948   Imaging I have reviewed the images obtained: CT-scan of the brain-right frontal area of encephalomalacia along with subtle area of hypoattenuation reflecting acute on chronic stroke in that area.  Assessment:  52 year old man with a past medical history of hypertension, currently not on any medication, tobacco abuse, presenting to the emergency room for evaluation of acute left-sided hemiparesis. His last known normal was past 24-hour window for any  sort of intravenous or endovascular intervention. NIH stroke scale of 8 consistent with a lacunar stroke-pure motor or mixed motor and sensory type. Contrast CT of the head shows a possible subtle area of hypoattenuation in the right frontal lobe adjacent to a area of encephalomalacia in that region-she is unaware of having had an acute stroke in the past but the encephalomalacia is likely representative of an old stroke Needs admission for stroke risk factor work-up.  Impression: Acute ischemic stroke-likely small vessel but evaluate for other risk factors.  Recommendations: -Admit to hospitalist or observation -Telemetry monitoring -Allow for permissive hypertension for the first 24-48h - only treat PRN if SBP >220 mmHg. Blood pressures can be gradually normalized to SBP<140 upon discharge. -MRI brain without contrast -CT Angiogram of Head and neck -Echocardiogram -HgbA1c, fasting lipid panel -Frequent neuro checks -Prophylactic therapy-Antiplatelet med: Aspirin - dose 325mg  PO  -Atorvastatin 80 mg PO daily -Risk factor modification -I discussed the importance of exercise as well as smoking/alcohol/illicit drug use cessation. -PT consult, OT consult, Speech consult  Please page stroke NP/PA/MD (listed on AMION)  from 8am-4 pm as this patient will be followed by the stroke  team at this point.  -- Milon Dikes, MD [facility] Pager: [phone] If 7pm to 7am, please call on call as listed on AMION.

## 2018-03-05 NOTE — ED Provider Notes (Signed)
[facility] EMERGENCY DEPARTMENT Provider Note   CSN: [id number] Arrival date & time: 03/05/18  1909     History   Chief Complaint Chief Complaint  Patient presents with  . Extremity Weakness    HPI Jesse Price is a 52 y.o. male.  Jesse Price is a 52 y.o. Male with a history of hypertension and asthma, who presents to the emergency department for evaluation of unilateral weakness to the left arm and left leg that began Friday around 530 to 6 PM.  Patient reports when he is tried to walk he falls because his left leg gives out and he is completely unable to move the left upper extremity, sensation is normal in both of these extremities.  He denies any facial droop difficulty with speech or swallowing or any visual changes.  No associated headache, no nausea, vomiting or dizziness.  He denies ever having symptoms like this in the past.  No chest pain or shortness of breath.  No abdominal pain, no nausea or vomiting.  No fevers or chills.     Past Medical History:  Diagnosis Date  . Asthma   . Hypertension     Patient Active Problem List   Diagnosis Date Noted  . Essential hypertension 03/17/2017  . Abnormal kidney function 03/17/2017  . Hyperkalemia 03/17/2017  . Sleep disturbance 03/17/2017  . Sleep walking 03/17/2017  . Sebaceous cyst 03/17/2017  . Laceration of nose 03/17/2017    History reviewed. No pertinent surgical history.      Home Medications    Prior to Admission medications   Medication Sig Start Date End Date Taking? Authorizing Provider  amLODipine (NORVASC) 5 MG tablet TAKE 1 TABLET BY MOUTH ONCE DAILY 08/16/17   Tysinger, Kermit Balo, PA-C  ibuprofen (ADVIL,MOTRIN) 800 MG tablet Take 1 tablet (800 mg total) by mouth every 8 (eight) hours as needed for mild pain or moderate pain. 03/15/15   Trixie Dredge, PA-C  mupirocin ointment (BACTROBAN) 2 % Place 1 application into the nose 3 (three) times daily. 03/10/17   Tysinger, Kermit Balo,  PA-C  rOPINIRole (REQUIP) 0.5 MG tablet Take 1 tablet (0.5 mg total) 3 (three) times daily by mouth. 03/17/17   Tysinger, Kermit Balo, PA-C    Family History Family History  Problem Relation Age of Onset  . Cancer Mother        brain  . Other Father        died in fire  . Hypertension Sister   . Hypertension Brother   . Cancer Brother        back  . Hypertension Sister   . Hypertension Sister   . Heart disease Neg Hx   . Stroke Neg Hx     Social History Social History   Tobacco Use  . Smoking status: Current Every Day Smoker    Packs/day: 0.50    Types: Cigarettes  . Smokeless tobacco: Never Used  Substance Use Topics  . Alcohol use: Yes    Alcohol/week: 1.0 standard drinks    Types: 1 Cans of beer per week    Comment: weekends  . Drug use: No     Allergies   Patient has no known allergies.   Review of Systems Review of Systems  Constitutional: Negative for chills and fever.  HENT: Negative.   Eyes: Negative for visual disturbance.  Respiratory: Negative for cough and shortness of breath.   Cardiovascular: Negative for chest pain.  Gastrointestinal: Negative for abdominal pain, nausea and vomiting.  Genitourinary: Negative for dysuria and frequency.  Musculoskeletal: Negative for arthralgias, back pain, myalgias and neck pain.  Skin: Negative for color change and rash.  Neurological: Positive for weakness. Negative for dizziness, syncope, facial asymmetry, speech difficulty, light-headedness, numbness and headaches.     Physical Exam Updated Vital Signs BP (!) 144/89 (BP Location: Left Arm)   Pulse 98   Temp 98.2 F (36.8 C) (Oral)   Resp 16   Ht 5\' 6"  (1.676 m)   Wt 74.8 kg   SpO2 100%   BMI 26.63 kg/m   Physical Exam  Constitutional: He is oriented to person, place, and time. He appears well-developed and well-nourished. No distress.  HENT:  Head: Normocephalic and atraumatic.  Mouth/Throat: Oropharynx is clear and moist.  Eyes: Pupils are  equal, round, and reactive to light. EOM are normal. Right eye exhibits no discharge. Left eye exhibits no discharge.  Neck: Normal range of motion. Neck supple.  Normal C-spine ROM  Cardiovascular: Normal rate, regular rhythm, normal heart sounds and intact distal pulses. Exam reveals no gallop and no friction rub.  No murmur heard. Pulmonary/Chest: Effort normal and breath sounds normal. No stridor. No respiratory distress. He has no wheezes. He has no rales.  Respirations equal and unlabored, patient able to speak in full sentences, lungs clear to auscultation bilaterally  Abdominal: Soft. Bowel sounds are normal. He exhibits no distension and no mass. There is no tenderness. There is no guarding.  Abdomen soft, nondistended, nontender to palpation in all quadrants without guarding or peritoneal signs  Musculoskeletal: He exhibits no edema or deformity.  Neurological: He is alert and oriented to person, place, and time.  Neurological Exam:  Mental Status: Alert and oriented to person, place, and time. Attention and concentration normal. Speech clear. Recent memory is intact. Follows commands. Cranial Nerves: Visual fields grossly intact. EOMI and PERRLA. No nystagmus noted. Facial sensation intact at forehead, maxillary cheek, and chin/mandible bilaterally.  There is some slight flattening of the left nasolabial fold intermittently but patient overall has no asymmetry of the face and no weakness. Hearing grossly normal. Uvula is midline, and palate elevates symmetrically. Normal SCM and trapezius strength. Tongue midline without fasciculations. Motor: Patient has flaccid paralysis of the left upper extremity, left lower extremity with 4/5 strength, right upper and lower extremity with 5/5 strength Sensation: Intact to light touch in upper and lower extremities bilaterally although sensation is decreased in the left upper extremity  Skin: Skin is warm and dry. Capillary refill takes less than 2  seconds. He is not diaphoretic.  Nursing note and vitals reviewed.    ED Treatments / Results  Labs (all labs ordered are listed, but only abnormal results are displayed) Labs Reviewed  CBC - Abnormal; Notable for the following components:      Result Value   WBC 12.2 (*)    All other components within normal limits  COMPREHENSIVE METABOLIC PANEL - Abnormal; Notable for the following components:   Glucose, Bld 100 (*)    Creatinine, Ser 1.33 (*)    GFR calc non Af Amer 60 (*)    All other components within normal limits  I-STAT CHEM 8, ED - Abnormal; Notable for the following components:   Sodium 147 (*)    Creatinine, Ser 1.60 (*)    Calcium, Ion 1.13 (*)    All other components within normal limits  PROTIME-INR  APTT  DIFFERENTIAL  HIV ANTIBODY (ROUTINE TESTING W REFLEX)  HEMOGLOBIN A1C  LIPID PANEL  I-STAT TROPONIN, ED    EKG None  Radiology Ct Head Wo Contrast  Result Date: 03/05/2018 CLINICAL DATA:  Left upper and lower extremity weakness for approximately 24 hours EXAM: CT HEAD WITHOUT CONTRAST TECHNIQUE: Contiguous axial images were obtained from the base of the skull through the vertex without intravenous contrast. COMPARISON:  None. FINDINGS: Brain: The ventricles are normal in size and duration. There is no demonstrable mass, hemorrhage, extra-axial fluid collection, or midline shift. There is evidence of localized encephalomalacia in the mid right frontal lobe, likely due to prior infarct. Adjacent to this encephalomalacia, there Is apparent cytotoxic edema at the gray-white junction of the right mid frontal lobe, suspicious for recent and probably acute infarct adjacent to an area of encephalomalacia. This edema extends into the anterior superior right centrum semiovale, felt to be part of this recent, likely acute infarct. Elsewhere, the brain parenchyma appears unremarkable. Vascular: No evident hyperdense vessel. There is calcification in each carotid siphon  region. Skull: Bony calvarium appears intact. Sinuses/Orbits: There is mucosal thickening in several ethmoid air cells. Other visualized paranasal sinuses are clear. Visualized orbits appear symmetric bilaterally. Other: Mastoid air cells are clear. IMPRESSION: 1. Cytotoxic edema in the mid right frontal lobe extending into the adjacent anterior superior right centrum semiovale. This appearance is felt to represent probable acute infarct immediately adjacent to an older infarct with encephalomalacia in the periphery of the mid right frontal lobe. No other evident acute infarct. No mass or hemorrhage. Brain parenchyma otherwise appears unremarkable. 2.  There is mucosal thickening in several ethmoid air cells. Electronically Signed   By: Bretta Bang III M.D.   On: 03/05/2018 19:48    Procedures Procedures (including critical care time)  Medications Ordered in ED Medications - No data to display   Initial Impression / Assessment and Plan / ED Course  I have reviewed the triage vital signs and the nursing notes.  Pertinent labs & imaging results that were available during my care of the patient were reviewed by me and considered in my medical decision making (see chart for details).  Patient presents with weakness of the left upper and lower extremity.  Last known well time 5:30 PM yesterday.  No associated facial droop or speech abnormality, no visual changes.  Symptoms have been persistent and not improving since onset.  Patient sister came home from out of town today to find him like this and immediately brought him in for evaluation.  Patient evaluated by neurology, and CT shows acute infarct in the mid right frontal lobe likely in between the internal capsule and thalamus given mixed sensory and motor symptoms.  Given that symptoms have been present for almost 30 hours at this point patient is not a candidate for acute neurologic intervention.  Dr. Elon Spanner with neurology recommends medicine  admission and neurology will consult.  Labs overall unremarkable, mild leukocytosis of 12.2 without infectious symptoms, stable hemoglobin, no acute electrolyte derangements, creatinine of 1.33, normal liver function.  Consult placed to [facility] hospitalist spoke with Dr. Antionette Char who will see and admit the patient.  Final Clinical Impressions(s) / ED Diagnoses   Final diagnoses:  Cerebrovascular accident (CVA), unspecified mechanism (HCC)  Left-sided weakness    ED Discharge Orders    None       Dartha Lodge, [place] 03/06/18 0135    Charlynne Pander, MD 03/06/18 [phone]

## 2018-03-06 ENCOUNTER — Observation Stay (HOSPITAL_BASED_OUTPATIENT_CLINIC_OR_DEPARTMENT_OTHER): Payer: Commercial Managed Care - PPO

## 2018-03-06 ENCOUNTER — Observation Stay (HOSPITAL_COMMUNITY): Payer: Commercial Managed Care - PPO

## 2018-03-06 ENCOUNTER — Encounter (HOSPITAL_COMMUNITY): Payer: Self-pay | Admitting: Radiology

## 2018-03-06 ENCOUNTER — Other Ambulatory Visit: Payer: Self-pay

## 2018-03-06 DIAGNOSIS — Z720 Tobacco use: Secondary | ICD-10-CM | POA: Diagnosis not present

## 2018-03-06 DIAGNOSIS — I371 Nonrheumatic pulmonary valve insufficiency: Secondary | ICD-10-CM | POA: Diagnosis not present

## 2018-03-06 DIAGNOSIS — R269 Unspecified abnormalities of gait and mobility: Secondary | ICD-10-CM | POA: Diagnosis not present

## 2018-03-06 DIAGNOSIS — I129 Hypertensive chronic kidney disease with stage 1 through stage 4 chronic kidney disease, or unspecified chronic kidney disease: Secondary | ICD-10-CM | POA: Diagnosis present

## 2018-03-06 DIAGNOSIS — F17210 Nicotine dependence, cigarettes, uncomplicated: Secondary | ICD-10-CM | POA: Diagnosis present

## 2018-03-06 DIAGNOSIS — R29708 NIHSS score 8: Secondary | ICD-10-CM | POA: Diagnosis present

## 2018-03-06 DIAGNOSIS — I6310 Cerebral infarction due to embolism of unspecified precerebral artery: Secondary | ICD-10-CM | POA: Diagnosis not present

## 2018-03-06 DIAGNOSIS — I6340 Cerebral infarction due to embolism of unspecified cerebral artery: Secondary | ICD-10-CM | POA: Diagnosis present

## 2018-03-06 DIAGNOSIS — I63233 Cerebral infarction due to unspecified occlusion or stenosis of bilateral carotid arteries: Secondary | ICD-10-CM | POA: Diagnosis not present

## 2018-03-06 DIAGNOSIS — I081 Rheumatic disorders of both mitral and tricuspid valves: Secondary | ICD-10-CM | POA: Diagnosis not present

## 2018-03-06 DIAGNOSIS — R531 Weakness: Secondary | ICD-10-CM | POA: Diagnosis not present

## 2018-03-06 DIAGNOSIS — I63421 Cerebral infarction due to embolism of right anterior cerebral artery: Secondary | ICD-10-CM | POA: Diagnosis not present

## 2018-03-06 DIAGNOSIS — W19XXXA Unspecified fall, initial encounter: Secondary | ICD-10-CM | POA: Diagnosis present

## 2018-03-06 DIAGNOSIS — I69398 Other sequelae of cerebral infarction: Secondary | ICD-10-CM | POA: Diagnosis not present

## 2018-03-06 DIAGNOSIS — I69322 Dysarthria following cerebral infarction: Secondary | ICD-10-CM | POA: Diagnosis not present

## 2018-03-06 DIAGNOSIS — D72829 Elevated white blood cell count, unspecified: Secondary | ICD-10-CM | POA: Diagnosis not present

## 2018-03-06 DIAGNOSIS — R29706 NIHSS score 6: Secondary | ICD-10-CM | POA: Diagnosis not present

## 2018-03-06 DIAGNOSIS — I340 Nonrheumatic mitral (valve) insufficiency: Secondary | ICD-10-CM | POA: Diagnosis not present

## 2018-03-06 DIAGNOSIS — G8114 Spastic hemiplegia affecting left nondominant side: Secondary | ICD-10-CM | POA: Diagnosis not present

## 2018-03-06 DIAGNOSIS — I6389 Other cerebral infarction: Secondary | ICD-10-CM | POA: Diagnosis not present

## 2018-03-06 DIAGNOSIS — G936 Cerebral edema: Secondary | ICD-10-CM | POA: Diagnosis present

## 2018-03-06 DIAGNOSIS — J45909 Unspecified asthma, uncomplicated: Secondary | ICD-10-CM | POA: Diagnosis present

## 2018-03-06 DIAGNOSIS — I5030 Unspecified diastolic (congestive) heart failure: Secondary | ICD-10-CM | POA: Diagnosis not present

## 2018-03-06 DIAGNOSIS — Z79899 Other long term (current) drug therapy: Secondary | ICD-10-CM | POA: Diagnosis not present

## 2018-03-06 DIAGNOSIS — Z8249 Family history of ischemic heart disease and other diseases of the circulatory system: Secondary | ICD-10-CM | POA: Diagnosis not present

## 2018-03-06 DIAGNOSIS — I69354 Hemiplegia and hemiparesis following cerebral infarction affecting left non-dominant side: Secondary | ICD-10-CM | POA: Diagnosis not present

## 2018-03-06 DIAGNOSIS — I10 Essential (primary) hypertension: Secondary | ICD-10-CM | POA: Diagnosis not present

## 2018-03-06 DIAGNOSIS — F1010 Alcohol abuse, uncomplicated: Secondary | ICD-10-CM | POA: Diagnosis present

## 2018-03-06 DIAGNOSIS — I5189 Other ill-defined heart diseases: Secondary | ICD-10-CM | POA: Diagnosis not present

## 2018-03-06 DIAGNOSIS — G8194 Hemiplegia, unspecified affecting left nondominant side: Secondary | ICD-10-CM | POA: Diagnosis present

## 2018-03-06 DIAGNOSIS — S0003XA Contusion of scalp, initial encounter: Secondary | ICD-10-CM | POA: Diagnosis present

## 2018-03-06 DIAGNOSIS — N182 Chronic kidney disease, stage 2 (mild): Secondary | ICD-10-CM | POA: Diagnosis present

## 2018-03-06 DIAGNOSIS — I639 Cerebral infarction, unspecified: Secondary | ICD-10-CM | POA: Diagnosis not present

## 2018-03-06 DIAGNOSIS — R29705 NIHSS score 5: Secondary | ICD-10-CM | POA: Diagnosis not present

## 2018-03-06 DIAGNOSIS — F1020 Alcohol dependence, uncomplicated: Secondary | ICD-10-CM | POA: Diagnosis not present

## 2018-03-06 DIAGNOSIS — R29704 NIHSS score 4: Secondary | ICD-10-CM | POA: Diagnosis not present

## 2018-03-06 DIAGNOSIS — E785 Hyperlipidemia, unspecified: Secondary | ICD-10-CM | POA: Diagnosis not present

## 2018-03-06 LAB — LIPID PANEL
Cholesterol: 172 mg/dL (ref 0–200)
HDL: 77 mg/dL (ref >40)
LDL CALC: 75 mg/dL (ref 0–99)
Total CHOL/HDL Ratio: 2.2 RATIO
Triglycerides: 100 mg/dL (ref <150)
VLDL: 20 mg/dL (ref 0–40)

## 2018-03-06 LAB — HEMOGLOBIN A1C
Hgb A1c MFr Bld: 5.6 % (ref 4.8–5.6)
Mean Plasma Glucose: 114.02 mg/dL

## 2018-03-06 LAB — ECHOCARDIOGRAM COMPLETE
Height: 66 in
WEIGHTICAEL: 2640 [oz_av]

## 2018-03-06 LAB — HIV ANTIBODY (ROUTINE TESTING W REFLEX): HIV Screen 4th Generation wRfx: NONREACTIVE

## 2018-03-06 MED ORDER — IOPAMIDOL (ISOVUE-370) INJECTION 76%
INTRAVENOUS | Status: AC
  Filled 2018-03-06: qty 50

## 2018-03-06 MED ORDER — IOPAMIDOL (ISOVUE-370) INJECTION 76%
50.0000 mL | Freq: Once | INTRAVENOUS | Status: AC | PRN
  Administered 2018-03-06: 50 mL via INTRAVENOUS

## 2018-03-06 NOTE — ED Notes (Signed)
Patient transported to CT 

## 2018-03-06 NOTE — Progress Notes (Addendum)
PROGRESS NOTE    Mouhamadou Gittleman  WUJ:[id number] DOB: December 26, 1965 DOA: 03/05/2018 PCP: Jac Canavan, PA-C    Brief Narrative:   Jesse Price is a left-handed 52 y.o. male with medical history significant for hypertension, tobacco abuse, chronic kidney disease stage II, and excessive daily alcohol use, now presenting to the emergency department for evaluation of weakness involving his left arm and leg.  The patient reports that he was in his usual state of health until the evening of 03/04/2018 when he developed acute onset of weakness involving the left arm and leg. Neurology was consulted by the ED physician and recommends a medical admission for further evaluation of acute ischemic CVA.  Assessment & Plan:   Principal Problem:   Acute ischemic stroke [facility]) Active Problems:   Essential hypertension   CKD (chronic kidney disease), stage II   Alcohol dependence (HCC)   Right-sided frontal lobe infarct possibly embolic as he has multiple old infarcts. And continues to have left hemiparesis. Follow-up with CT angiogram of the head and neck.  MRI of the head showed acute/subacute infarction in the right posterior medial frontal lobe with multiple old infarcts.  Echocardiogram ordered and showed  cavity size was normal. Wall thickness was   normal. Systolic function was normal. The estimated ejection   fraction was in the range of 55% to 60%. Wall motion was normal;   there were no regional wall motion abnormalities. Doppler   parameters are consistent with abnormal left ventricular relaxation, grade 1 diastolic dysfunction.  LDL is 75 resume Lipitor Hemoglobin A1c 5.6 continue with SSI. Neurology consulted and recommendations given. Aspirin 325 mg daily Therapy evaluations are pending Patient will probably need TEE and loop placement.     Hyperlipidemia LDL is 75, currently on Lipitor 80 mg.   Hypertension permissive hypertension (OK if < 220/120   EtOH  abuse Currently on CIWA protocol, watch for withdrawal symptoms.   Acute on stage II CKD creatinine 1.6 Hydrate with normal saline and repeat renal para meters in the morning.  Mild leukocytosis No signs of infection repeat CBC in the morning.  DVT prophylaxis: Lovenox Code Status: (Full code Family Communication: family at bedside Disposition Plan: Pending further evaluation  Consultants:   Neurology   Procedures: CT angio of the head and neck.   Echocardiogram.    Antimicrobials: none.    Subjective: Anxious and worried about his stroke.  No chest pain or sob. No nausea, or vomiting.   Objective: Vitals:   03/06/18 0700 03/06/18 0900 03/06/18 1000 03/06/18 1113  BP: (!) 108/94 124/79 120/75 138/83  Pulse: 70 83 67 71  Resp: 11   16  Temp:    99.1 F (37.3 C)  TempSrc:    Oral  SpO2: 96% 99% 99% 100%  Weight:      Height:        Intake/Output Summary (Last 24 hours) at 03/06/2018 1557 Last data filed at 03/06/2018 0706 Gross per 24 hour  Intake -  Output 400 ml  Net -400 ml   Filed Weights   03/05/18 1916  Weight: 74.8 kg    Examination:  General exam: Appears calm and comfortable  Respiratory system: Clear to auscultation. Respiratory effort normal. Cardiovascular system: S1 & S2 heard, RRR. No JVD, murmurs. No pedal edema. Gastrointestinal system: Abdomen is soft non tender non distended bowel sounds good.  Central nervous system: Alert and oriented.  Left hemiparesis.  Extremities: no cyanosis or clubbing.  Skin: No rashes, lesions or ulcers  Psychiatry:  Mood & affect appropriate.     Data Reviewed: I have personally reviewed following labs and imaging studies  CBC: Recent Labs  Lab 03/05/18 1948 03/05/18 2008  WBC 12.2*  --   NEUTROABS 6.6  --   HGB 15.9 16.7  HCT 49.2 49.0  MCV 94.6  --   PLT 397  --    Basic Metabolic Panel: Recent Labs  Lab 03/05/18 1948 03/05/18 2008  NA 144 147*  K 4.0 4.2  CL 108 107  CO2 27  --    GLUCOSE 100* 97  BUN 13 16  CREATININE 1.33* 1.60*  CALCIUM 9.3  --    GFR: Estimated Creatinine Clearance: 48.2 mL/min (A) (by C-G formula based on SCr of 1.6 mg/dL (H)). Liver Function Tests: Recent Labs  Lab 03/05/18 1948  AST 32  ALT 19  ALKPHOS 57  BILITOT 0.6  PROT 7.6  ALBUMIN 4.7   No results for input(s): LIPASE, AMYLASE in the last 168 hours. No results for input(s): AMMONIA in the last 168 hours. Coagulation Profile: Recent Labs  Lab 03/05/18 1948  INR 0.97   Cardiac Enzymes: No results for input(s): CKTOTAL, CKMB, CKMBINDEX, TROPONINI in the last 168 hours. BNP (last 3 results) No results for input(s): PROBNP in the last 8760 hours. HbA1C: Recent Labs    03/06/18 0223  HGBA1C 5.6   CBG: No results for input(s): GLUCAP in the last 168 hours. Lipid Profile: Recent Labs    03/06/18 0223  CHOL 172  HDL 77  LDLCALC 75  TRIG 100  CHOLHDL 2.2   Thyroid Function Tests: No results for input(s): TSH, T4TOTAL, FREET4, T3FREE, THYROIDAB in the last 72 hours. Anemia Panel: No results for input(s): VITAMINB12, FOLATE, FERRITIN, TIBC, IRON, RETICCTPCT in the last 72 hours. Sepsis Labs: No results for input(s): PROCALCITON, LATICACIDVEN in the last 168 hours.  No results found for this or any previous visit (from the past 240 hour(s)).       Radiology Studies: Ct Angio Head W Or Wo Contrast  Result Date: 03/06/2018 CLINICAL DATA:  52 y/o  M; stroke for follow-up. EXAM: CT ANGIOGRAPHY HEAD AND NECK TECHNIQUE: Multidetector CT imaging of the head and neck was performed using the standard protocol during bolus administration of intravenous contrast. Multiplanar CT image reconstructions and MIPs were obtained to evaluate the vascular anatomy. Carotid stenosis measurements (when applicable) are obtained utilizing NASCET criteria, using the distal internal carotid diameter as the denominator. CONTRAST:  50mL ISOVUE-370 IOPAMIDOL (ISOVUE-370) INJECTION 76%  COMPARISON:  03/05/2018 MRI of the head. FINDINGS: CTA NECK FINDINGS Aortic arch: Bovine variant branching. Imaged portion shows no evidence of aneurysm or dissection. No significant stenosis of the major arch vessel origins. Right carotid system: No evidence of dissection, stenosis (50% or greater) or occlusion. Right carotid bifurcation mixed plaque minimal less than 30% proximal ICA stenosis. Left carotid system: No evidence of dissection, stenosis (50% or greater) or occlusion. Left carotid bifurcation mixed plaque with minimal less than 30% proximal ICA stenosis. Vertebral arteries: Codominant. No evidence of dissection, stenosis (50% or greater) or occlusion. Skeleton: Negative. Other neck: Negative. Upper chest: Negative. Review of the MIP images confirms the above findings CTA HEAD FINDINGS Anterior circulation: No significant stenosis, proximal occlusion, aneurysm, or vascular malformation. Mild non stenotic calcific atherosclerosis of carotid siphons. Posterior circulation: No significant stenosis, proximal occlusion, aneurysm, or vascular malformation. Moderate right P2 stenosis. Venous sinuses: As permitted by contrast timing, patent. Anatomic variants: Anterior communicating artery. No posterior  communicating artery identified, likely hypoplastic or absent. Delayed phase: No abnormal intracranial enhancement. Review of the MIP images confirms the above findings IMPRESSION: 1. Patent carotid and vertebral arteries. No dissection, aneurysm, or hemodynamically significant stenosis utilizing NASCET criteria. 2. Patent anterior and posterior intracranial circulation. No large vessel occlusion, aneurysm, or high-grade stenosis. 3. Moderate right P2 PCA stenosis. 4. Non stenotic stenotic atherosclerosis of carotid bifurcations and carotid siphons. Electronically Signed   By: Mitzi Hansen M.D.   On: 03/06/2018 02:32   Ct Head Wo Contrast  Result Date: 03/05/2018 CLINICAL DATA:  Left upper and  lower extremity weakness for approximately 24 hours EXAM: CT HEAD WITHOUT CONTRAST TECHNIQUE: Contiguous axial images were obtained from the base of the skull through the vertex without intravenous contrast. COMPARISON:  None. FINDINGS: Brain: The ventricles are normal in size and duration. There is no demonstrable mass, hemorrhage, extra-axial fluid collection, or midline shift. There is evidence of localized encephalomalacia in the mid right frontal lobe, likely due to prior infarct. Adjacent to this encephalomalacia, there Is apparent cytotoxic edema at the gray-white junction of the right mid frontal lobe, suspicious for recent and probably acute infarct adjacent to an area of encephalomalacia. This edema extends into the anterior superior right centrum semiovale, felt to be part of this recent, likely acute infarct. Elsewhere, the brain parenchyma appears unremarkable. Vascular: No evident hyperdense vessel. There is calcification in each carotid siphon region. Skull: Bony calvarium appears intact. Sinuses/Orbits: There is mucosal thickening in several ethmoid air cells. Other visualized paranasal sinuses are clear. Visualized orbits appear symmetric bilaterally. Other: Mastoid air cells are clear. IMPRESSION: 1. Cytotoxic edema in the mid right frontal lobe extending into the adjacent anterior superior right centrum semiovale. This appearance is felt to represent probable acute infarct immediately adjacent to an older infarct with encephalomalacia in the periphery of the mid right frontal lobe. No other evident acute infarct. No mass or hemorrhage. Brain parenchyma otherwise appears unremarkable. 2.  There is mucosal thickening in several ethmoid air cells. Electronically Signed   By: Bretta Bang III M.D.   On: 03/05/2018 19:48   Ct Angio Neck W Or Wo Contrast  Result Date: 03/06/2018 CLINICAL DATA:  52 y/o  M; stroke for follow-up. EXAM: CT ANGIOGRAPHY HEAD AND NECK TECHNIQUE: Multidetector CT  imaging of the head and neck was performed using the standard protocol during bolus administration of intravenous contrast. Multiplanar CT image reconstructions and MIPs were obtained to evaluate the vascular anatomy. Carotid stenosis measurements (when applicable) are obtained utilizing NASCET criteria, using the distal internal carotid diameter as the denominator. CONTRAST:  50mL ISOVUE-370 IOPAMIDOL (ISOVUE-370) INJECTION 76% COMPARISON:  03/05/2018 MRI of the head. FINDINGS: CTA NECK FINDINGS Aortic arch: Bovine variant branching. Imaged portion shows no evidence of aneurysm or dissection. No significant stenosis of the major arch vessel origins. Right carotid system: No evidence of dissection, stenosis (50% or greater) or occlusion. Right carotid bifurcation mixed plaque minimal less than 30% proximal ICA stenosis. Left carotid system: No evidence of dissection, stenosis (50% or greater) or occlusion. Left carotid bifurcation mixed plaque with minimal less than 30% proximal ICA stenosis. Vertebral arteries: Codominant. No evidence of dissection, stenosis (50% or greater) or occlusion. Skeleton: Negative. Other neck: Negative. Upper chest: Negative. Review of the MIP images confirms the above findings CTA HEAD FINDINGS Anterior circulation: No significant stenosis, proximal occlusion, aneurysm, or vascular malformation. Mild non stenotic calcific atherosclerosis of carotid siphons. Posterior circulation: No significant stenosis, proximal occlusion, aneurysm, or vascular  malformation. Moderate right P2 stenosis. Venous sinuses: As permitted by contrast timing, patent. Anatomic variants: Anterior communicating artery. No posterior communicating artery identified, likely hypoplastic or absent. Delayed phase: No abnormal intracranial enhancement. Review of the MIP images confirms the above findings IMPRESSION: 1. Patent carotid and vertebral arteries. No dissection, aneurysm, or hemodynamically significant stenosis  utilizing NASCET criteria. 2. Patent anterior and posterior intracranial circulation. No large vessel occlusion, aneurysm, or high-grade stenosis. 3. Moderate right P2 PCA stenosis. 4. Non stenotic stenotic atherosclerosis of carotid bifurcations and carotid siphons. Electronically Signed   By: Mitzi Hansen M.D.   On: 03/06/2018 02:32   Mr Brain Wo Contrast  Result Date: 03/05/2018 CLINICAL DATA:  52 y/o  M; left-sided weakness. EXAM: MRI HEAD WITHOUT CONTRAST TECHNIQUE: Multiplanar, multiecho pulse sequences of the brain and surrounding structures were obtained without intravenous contrast. COMPARISON:  03/05/2018 CT head. FINDINGS: Brain: Small region of cortical reduced diffusion within the right posteromedial frontal lobe inclusive of precentral gyrus compatible with acute/early subacute infarction (series 5, image 89). No associated hemorrhage or mass effect. No extra-axial collection, hydrocephalus, or herniation. Small chronic cortical infarctions are present within the lateral aspect of frontal lobes bilaterally. And there are very small chronic infarctions within the high right parietal and left posterior temporal cortices. Mild-to-moderate volume loss of the brain for age. No abnormal susceptibility hypointensity to indicate intracranial hemorrhage. Vascular: Normal flow voids. Skull and upper cervical spine: Normal marrow signal. Sinuses/Orbits: Mild ethmoid sinus mucosal thickening. No abnormal signal of mastoid air cells. Orbits are unremarkable Other: None. IMPRESSION: 1. Small acute/early subacute infarction within the right posteromedial frontal lobe inclusive of precentral gyrus. No associated hemorrhage or mass effect. 2. Multiple small chronic infarctions within the bilateral frontal lobes, right parietal lobe, and left posterior temporal lobe. 3. Mild to moderate volume loss of the brain for age. These results will be called to the ordering clinician or representative by the  Radiologist Assistant, and communication documented in the PACS or zVision Dashboard. Electronically Signed   By: Mitzi Hansen M.D.   On: 03/05/2018 23:43        Scheduled Meds: . aspirin  300 mg Rectal Daily   Or  . aspirin  325 mg Oral Daily  . atorvastatin  80 mg Oral q1800  . enoxaparin (LOVENOX) injection  40 mg Subcutaneous Daily  . folic acid  1 mg Oral Daily  . LORazepam  0-4 mg Intravenous Q6H   Followed by  . [START ON 03/08/2018] LORazepam  0-4 mg Intravenous Q12H  . multivitamin with minerals  1 tablet Oral Daily  . thiamine  100 mg Oral Daily   Or  . thiamine  100 mg Intravenous Daily   Continuous Infusions:   LOS: 0 days    Time spent: 35 min    Kathlen Mody, MD [facility] Pager [phone]  If 7PM-7AM, please contact night-coverage [email or web address] Password [email or web address] 03/06/2018, 3:57 PM

## 2018-03-06 NOTE — Progress Notes (Signed)
STROKE TEAM PROGRESS NOTE   HISTORY OF PRESENT ILLNESS (per record) Jesse Price is a 52 y.o. male past medical history of hypertension, not on any antihypertensives at this time, was in usual state of health yesterday and the last known normal around 5:30 PM on 03/04/2018 when he had sudden onset of left arm and leg weakness.  He said he was so weak that he could not move or even get his phone to ask for help.  He was on the ground for a few hours before his wife could get him to the hospital she was out of town and return to just this evening. He denies having had any preceding illnesses like fevers chills nausea vomiting headaches or visual symptoms. He has not had any stroke or strokelike symptoms in the past. He is left-handed, welder by occupation. He was diagnosed with hypertension last year and his doctor had recommended that he try amlodipine but he never continued the treatment with amlodipine. He currently abuses tobacco-smokes pack cigarettes a day. Denies any illicit drug use.  His wife brought him to the ER this evening.  He was evaluated in triage and sent for noncontrast CT of the head which is concerning for a right-sided stroke.  Neurology was asked to see him in consultation for the acute left-sided weakness and abnormal CT scan.  LKW: 5:30 PM on 03/04/2018 tpa given?: no, outside the window Premorbid modified Rankin scale (mRS): 0   SUBJECTIVE (INTERVAL HISTORY) His family is not at bedside. He is having difficulty using his left arm and hand. Says he cannot move his fingers, has to think very hard to do that. Discussed embolic stroke with patient, workup pending.     OBJECTIVE Vitals:   03/06/18 0700 03/06/18 0900 03/06/18 1000 03/06/18 1113  BP: (!) 108/94 124/79 120/75 138/83  Pulse: 70 83 67 71  Resp: 11   16  Temp:    99.1 F (37.3 C)  TempSrc:    Oral  SpO2: 96% 99% 99% 100%  Weight:      Height:        CBC:  Recent Labs  Lab 03/05/18 1948  03/05/18 2008  WBC 12.2*  --   NEUTROABS 6.6  --   HGB 15.9 16.7  HCT 49.2 49.0  MCV 94.6  --   PLT 397  --     Basic Metabolic Panel:  Recent Labs  Lab 03/05/18 1948 03/05/18 2008  NA 144 147*  K 4.0 4.2  CL 108 107  CO2 27  --   GLUCOSE 100* 97  BUN 13 16  CREATININE 1.33* 1.60*  CALCIUM 9.3  --     Lipid Panel:     Component Value Date/Time   CHOL 172 03/06/2018 0223   TRIG 100 03/06/2018 0223   HDL 77 03/06/2018 0223   CHOLHDL 2.2 03/06/2018 0223   VLDL 20 03/06/2018 0223   LDLCALC 75 03/06/2018 0223   HgbA1c:  Lab Results  Component Value Date   HGBA1C 5.6 03/06/2018   Urine Drug Screen: No results found for: LABOPIA, COCAINSCRNUR, LABBENZ, AMPHETMU, THCU, LABBARB  Alcohol Level No results found for: ETH  IMAGING  Ct Angio Head W Or Wo Contrast Ct Angio Neck W Or Wo Contrast 03/06/2018 IMPRESSION:  1. Patent carotid and vertebral arteries. No dissection, aneurysm, or hemodynamically significant stenosis utilizing NASCET criteria.  2. Patent anterior and posterior intracranial circulation. No large vessel occlusion, aneurysm, or high-grade stenosis.  3. Moderate right P2 PCA stenosis.  4. Non stenotic stenotic atherosclerosis of carotid bifurcations and carotid siphons.    Ct Head Wo Contrast 03/05/2018 IMPRESSION:  1. Cytotoxic edema in the mid right frontal lobe extending into the adjacent anterior superior right centrum semiovale. This appearance is felt to represent probable acute infarct immediately adjacent to an older infarct with encephalomalacia in the periphery of the mid right frontal lobe. No other evident acute infarct. No mass or hemorrhage. Brain parenchyma otherwise appears unremarkable.  2.  There is mucosal thickening in several ethmoid air cells.    Mr Brain Wo Contrast 03/05/2018 IMPRESSION:  1. Small acute/early subacute infarction within the right posteromedial frontal lobe inclusive of precentral gyrus. No associated  hemorrhage or mass effect.  2. Multiple small chronic infarctions within the bilateral frontal lobes, right parietal lobe, and left posterior temporal lobe.  3. Mild to moderate volume loss of the brain for age.     Transthoracic Echocardiogram  03/06/2018 Study Conclusions - Left ventricle: The cavity size was normal. Wall thickness was   normal. Systolic function was normal. The estimated ejection   fraction was in the range of 55% to 60%. Wall motion was normal;   there were no regional wall motion abnormalities. Doppler   parameters are consistent with abnormal left ventricular   relaxation (grade 1 diastolic dysfunction).    PHYSICAL EXAM Blood pressure 138/83, pulse 71, temperature 99.1 F (37.3 C), temperature source Oral, resp. rate 16, height 5\' 6"  (1.676 m), weight 74.8 kg, SpO2 100 %.   PHYSICAL EXAM Physical exam: Exam: Gen: NAD Eyes: anicteric sclerae, moist conjunctivae                    CV: no MRG, no carotid bruits, no peripheral edema Mental Status: Alert, follows commands, good historian  Neuro: Detailed Neurologic Exam  Speech:    No aphasia, no dysarthria. Naming and repetition intact.  Cranial Nerves:    The pupils are equal, round, and reactive to light.. Attempted, Fundi not visualized.  EOMI.  Visual fields full. Left lower facial mild weakness, Tongue midline, uvula midline, Hearing intact to voice. Shoulder shrug intact  Motor Observation:    no involuntary movements noted. Tone appears normal.     Strength:  Left arm 2-/5, able to lift off the bed with drift, weakness of left hand grip. Lower extremity is 4/5. Right is 5/5.   Sensation: Decreased left arm >> left leg  Coordination: No dysmetria with right limbs. Unable on the left due to weakness. ASSESSMENT/PLAN [name] is a 52 y.o. male with history of previous strokes by imaging, tobacco use and hypertension, not on any antihypertensives at this time  presenting with sudden  onset of left arm and leg weakness. He did not receive IV t-PA due to late presentation.  Stroke: Rt frontal lobe infarct - embolic given multiple old strokes in multiple distributions.  Resultant  Left hemiparesis, mild left NS flattening  CT head - Cytotoxic edema in the mid right frontal lobe extending into the adjacent anterior superior right centrum semiovale. This appearance is felt to represent probable acute infarct immediately adjacent to an older infarct with encephalomalacia in the periphery of the mid right frontal lobe  MRI head - Small acute/early subacute infarction within the right posteromedial frontal lobe inclusive of precentral gyrus. Multiple old infarcts.  Will need TEE and loop  MRA head - not performed  CTA H&N - no high grade stenosis  Carotid Doppler - CTA neck performed or  pending - carotid dopplers not indicated.  2D Echo  - EF 55 - 60%. No cardiac source of emboli identified.   LDL - 75  HgbA1c - 5.6  VTE prophylaxis - Lovenox  Diet - Heart healthy with thin liquids.  No antithrombotic prior to admission, now on aspirin 325 mg daily  Patient counseled to be compliant with his antithrombotic medications  Ongoing aggressive stroke risk factor management  Therapy recommendations:  pending  Disposition:  Pending  Hypertension  Stable . Permissive hypertension (OK if < 220/120) but gradually normalize in 5-7 days . Long-term BP goal normotensive  Hyperlipidemia  Lipid lowering medication PTA:  none  LDL 75, goal < 70  Current lipid lowering medication: Now on Lipitor 80 mg daily.   Continue statin at discharge   Other Stroke Risk Factors  Cigarette smoker - advised to stop smoking  ETOH use, advised to drink no more than 1 alcoholic beverage per day.  Previous strokes by imaging  Other Active Problems  Mild leukocytosis  Elevated creatinine 1.6  Plan  Await therapy evaluations.   Will need TEE / loop    Hospital day #  0  Personally examined patient and images, and have participated in and made any corrections needed to history, physical, neuro exam,assessment and plan as stated above.  I have personally obtained the history, evaluated lab date, reviewed imaging studies and agree with radiology interpretations.    Naomie Dean, MD Stroke Neurology   To contact Stroke Continuity provider, please refer to [email or web address]. After hours, contact General Neurology

## 2018-03-06 NOTE — ED Notes (Signed)
Pt transported to Echo. 

## 2018-03-06 NOTE — ED Notes (Signed)
Pt's sister, Loreen Freud, left phone number, if needed. [phone]

## 2018-03-06 NOTE — Evaluation (Signed)
Speech Language Pathology Evaluation Patient Details Name: Jesse Price MRN: [id number] DOB: 08-10-1965 Today's Date: 03/06/2018 Time: 1191-4782 SLP Time Calculation (min) (ACUTE ONLY): 21 min  Problem List:  Patient Active Problem List   Diagnosis Date Noted  . Acute ischemic stroke (HCC) 03/05/2018  . CKD (chronic kidney disease), stage II 03/05/2018  . Alcohol dependence (HCC) 03/05/2018  . Essential hypertension 03/17/2017  . Abnormal kidney function 03/17/2017  . Hyperkalemia 03/17/2017  . Sleep disturbance 03/17/2017  . Sleep walking 03/17/2017  . Sebaceous cyst 03/17/2017  . Laceration of nose 03/17/2017   Past Medical History:  Past Medical History:  Diagnosis Date  . Asthma   . Hypertension    Past Surgical History: History reviewed. No pertinent surgical history. HPI:  [name] is a 52 y.o. male with history of previous strokes by imaging, tobacco use and hypertension, not on any antihypertensives at this time presenting with sudden onset of left arm and leg weakness. He did not receive IV t-PA due to late presentation. MRI showed small acute/early subacute infarction within the right   Assessment / Plan / Recommendation Clinical Impression   Jesse Price presents with moderate cognitive communication impairment with deficits noted in sustained attention, intellectual awareness, short term recall, problem solving and executive function. Pt initially denied deficits, however when confronted with errors in cognitive-linguistic tasks he agreed he was having difficulties concentrating and that tasks were "confusing" for him. He scored 14/30 on MOCA form 7.1 (26 and above is considered normal). Attention impacted his ability to follow instructions accurately, and he requested repetition several times. Mod cues required to ID and correct errors; pt was responsive to teach-back from SLP re: deficits and appeared motivated to work on his impairments. He would be a good  candidate for CIR pending determination of OT/PT needs. Will follow acutely.    SLP Assessment  SLP Recommendation/Assessment: Patient needs continued Speech Lanaguage Pathology Services SLP Visit Diagnosis: Cognitive communication deficit (R41.841)    Follow Up Recommendations  Inpatient Rehab    Frequency and Duration min 2x/week  2 weeks      SLP Evaluation Cognition  Overall Cognitive Status: Impaired/Different from baseline Arousal/Alertness: Awake/alert Orientation Level: Oriented X4 Attention: Sustained;Selective Sustained Attention: Impaired Sustained Attention Impairment: Verbal complex Selective Attention: Impaired Selective Attention Impairment: Verbal basic;Functional basic Memory: Impaired Memory Impairment: Storage deficit(delayed recall 0/5, 2/5 with category cue) Awareness: Impaired Awareness Impairment: Intellectual impairment;Emergent impairment;Anticipatory impairment Problem Solving: Impaired Problem Solving Impairment: Verbal basic Executive Function: Organizing;Self Monitoring;Self Correcting Organizing: Impaired Organizing Impairment: Functional basic(clockdrawing impaired 0/3) Self Monitoring: Impaired Self Monitoring Impairment: Verbal basic;Functional basic Self Correcting: Impaired Self Correcting Impairment: Verbal basic;Functional basic Behaviors: Impulsive Safety/Judgment: Impaired Comments: decreased awareness of deficits, impulsivity       Comprehension  Auditory Comprehension Overall Auditory Comprehension: Impaired(attention vs linguistic) Conversation: Simple Interfering Components: Attention Visual Recognition/Discrimination Discrimination: Within Function Limits Reading Comprehension Reading Status: Not tested    Expression Expression Primary Mode of Expression: Verbal Verbal Expression Overall Verbal Expression: Appears within functional limits for tasks assessed Repetition: Impaired Level of Impairment: Sentence  level(attention) Interfering Components: Attention Written Expression Dominant Hand: Left Written Expression: Not tested   Oral / Motor  Oral Motor/Sensory Function Overall Oral Motor/Sensory Function: Within functional limits Motor Speech Overall Motor Speech: Appears within functional limits for tasks assessed(hypoarticulation though pt reports this is baseline)   GO                   Jesse Baton, MS,  CCC-SLP Speech-Language Pathologist Acute Rehabilitation Services Pager: [phone] Office: [phone]  Arlana Lindau 03/06/2018, 4:30 PM

## 2018-03-06 NOTE — ED Notes (Signed)
Radiology dept contacted this RN regarding MRI findings. Admitting MD notified via amion. MD returned call immediately to verify results.

## 2018-03-06 NOTE — ED Notes (Addendum)
Family member present and is assisting pt w/eating breakfast. Pt noted to be wearing eyeglasses.

## 2018-03-06 NOTE — Progress Notes (Signed)
  Echocardiogram 2D Echocardiogram has been performed.  Delcie Roch 03/06/2018, 9:38 AM

## 2018-03-07 DIAGNOSIS — I639 Cerebral infarction, unspecified: Secondary | ICD-10-CM

## 2018-03-07 DIAGNOSIS — I5189 Other ill-defined heart diseases: Secondary | ICD-10-CM

## 2018-03-07 DIAGNOSIS — I10 Essential (primary) hypertension: Secondary | ICD-10-CM

## 2018-03-07 DIAGNOSIS — F1020 Alcohol dependence, uncomplicated: Secondary | ICD-10-CM

## 2018-03-07 DIAGNOSIS — N182 Chronic kidney disease, stage 2 (mild): Secondary | ICD-10-CM

## 2018-03-07 DIAGNOSIS — R531 Weakness: Secondary | ICD-10-CM

## 2018-03-07 DIAGNOSIS — D72829 Elevated white blood cell count, unspecified: Secondary | ICD-10-CM

## 2018-03-07 DIAGNOSIS — Z720 Tobacco use: Secondary | ICD-10-CM

## 2018-03-07 LAB — BASIC METABOLIC PANEL
Anion gap: 6 (ref 5–15)
BUN: 11 mg/dL (ref 6–20)
CO2: 26 mmol/L (ref 22–32)
Calcium: 9.3 mg/dL (ref 8.9–10.3)
Chloride: 106 mmol/L (ref 98–111)
Creatinine, Ser: 1.29 mg/dL — ABNORMAL HIGH (ref 0.61–1.24)
Glucose, Bld: 93 mg/dL (ref 70–99)
Potassium: 4.2 mmol/L (ref 3.5–5.1)
SODIUM: 138 mmol/L (ref 135–145)

## 2018-03-07 LAB — CBC
HCT: 43.5 % (ref 39.0–52.0)
Hemoglobin: 14.4 g/dL (ref 13.0–17.0)
MCH: 30.6 pg (ref 26.0–34.0)
MCHC: 33.1 g/dL (ref 30.0–36.0)
MCV: 92.6 fL (ref 80.0–100.0)
Platelets: 342 10*3/uL (ref 150–400)
RBC: 4.7 MIL/uL (ref 4.22–5.81)
RDW: 12.6 % (ref 11.5–15.5)
WBC: 11.8 10*3/uL — AB (ref 4.0–10.5)
nRBC: 0 % (ref 0.0–0.2)

## 2018-03-07 MED ORDER — SODIUM CHLORIDE 0.9 % IV SOLN
INTRAVENOUS | Status: DC
  Administered 2018-03-08 (×2): via INTRAVENOUS

## 2018-03-07 NOTE — Progress Notes (Signed)
PROGRESS NOTE    Jesse Price  ZOX:[id number] DOB: [birth year] DOA: 03/05/2018 PCP: Jac Canavan, PA-C    Brief Narrative:   Jesse Price is a left-handed 52 y.o. male with medical history significant for hypertension, tobacco abuse, chronic kidney disease stage II, and excessive daily alcohol use, now presenting to the emergency department for evaluation of weakness involving his left arm and leg.  The patient reports that he was in his usual state of health until the evening of 03/04/2018 when he developed acute onset of weakness involving the left arm and leg. Neurology was consulted by the ED physician and recommends a medical admission for further evaluation of acute ischemic CVA.  Assessment & Plan:   Principal Problem:   Acute ischemic stroke [facility]) Active Problems:   Essential hypertension   CKD (chronic kidney disease), stage II   Alcohol dependence (HCC)   Right-sided frontal lobe infarct possibly embolic as he has multiple old infarcts. And continues to have left hemiparesis. Follow-up with CT angiogram of the head and neck.  MRI of the head showed acute/subacute infarction in the right posterior medial frontal lobe with multiple old infarcts.  Echocardiogram ordered and showed  cavity size was normal. Wall thickness was   normal. Systolic function was normal. The estimated ejection   fraction was in the range of 55% to 60%. Wall motion was normal;   there were no regional wall motion abnormalities. Doppler   parameters are consistent with abnormal left ventricular relaxation, grade 1 diastolic dysfunction.  LDL is 75 resume Lipitor Hemoglobin A1c 5.6 continue with SSI. Neurology consulted and recommendations given. Aspirin 325 mg daily Therapy evaluations are pending Patient will probably need TEE and loop placement.cardiology requested for TEE TO BE done int he next 24 hours.      Hyperlipidemia LDL is 75, currently on Lipitor 80 mg.   Hypertension  permissive hypertension (OK if < 220/120)   EtOH abuse Currently on CIWA protocol, watch for withdrawal symptoms.   Acute on stage II CKD creatinine 1.6 Hydrate with normal saline and repeat renal para meters in the morning show much improvement.   Mild leukocytosis No signs of infection repeat CBC in the morning show improvement.   DVT prophylaxis: Lovenox Code Status: (Full code Family Communication: family at bedside Disposition Plan: Pending further evaluation  Consultants:   Neurology   Procedures: CT angio of the head and neck.   Echocardiogram.    Antimicrobials: none.    Subjective: Anxious and worried about his stroke.  Pain in the back of his head, small scalp hematoma.  Answered all their questions.   Objective: Vitals:   03/06/18 2337 03/07/18 0300 03/07/18 0736 03/07/18 1120  BP: (!) 130/97 (!) 147/90 (!) 160/87 (!) 142/81  Pulse: 74 64 62 73  Resp: 18 18  18   Temp: 97.6 F (36.4 C) (!) 97.5 F (36.4 C) 97.9 F (36.6 C) 98.7 F (37.1 C)  TempSrc: Axillary Oral Oral Oral  SpO2: 99% 100% 100% 100%  Weight:      Height:        Intake/Output Summary (Last 24 hours) at 03/07/2018 1128 Last data filed at 03/06/2018 1959 Gross per 24 hour  Intake 240 ml  Output 850 ml  Net -610 ml   Filed Weights   03/05/18 1916  Weight: 74.8 kg    Examination:  General exam: Appears calm and comfortable not in any distress.  Respiratory system: Clear to auscultation. Respiratory effort normal. No wheezing or rhonchi.  Cardiovascular system: S1 & S2 heard, RRR. No JVD, murmurs. No pedal edema. Gastrointestinal system: Abdomen is soft NT ND BS+ Central nervous system: Alert and oriented.  Left hemiparesis. No sensory deficits.  Extremities: no cyanosis or clubbing.  Skin: No rashes, lesions or ulcers Psychiatry:  Mood & affect appropriate.     Data Reviewed: I have personally reviewed following labs and imaging studies  CBC: Recent Labs  Lab  03/05/18 1948 03/05/18 2008 03/07/18 0715  WBC 12.2*  --  11.8*  NEUTROABS 6.6  --   --   HGB 15.9 16.7 14.4  HCT 49.2 49.0 43.5  MCV 94.6  --  92.6  PLT 397  --  342   Basic Metabolic Panel: Recent Labs  Lab 03/05/18 1948 03/05/18 2008 03/07/18 0715  NA 144 147* 138  K 4.0 4.2 4.2  CL 108 107 106  CO2 27  --  26  GLUCOSE 100* 97 93  BUN 13 16 11   CREATININE 1.33* 1.60* 1.29*  CALCIUM 9.3  --  9.3   GFR: Estimated Creatinine Clearance: 59.8 mL/min (A) (by C-G formula based on SCr of 1.29 mg/dL (H)). Liver Function Tests: Recent Labs  Lab 03/05/18 1948  AST 32  ALT 19  ALKPHOS 57  BILITOT 0.6  PROT 7.6  ALBUMIN 4.7   No results for input(s): LIPASE, AMYLASE in the last 168 hours. No results for input(s): AMMONIA in the last 168 hours. Coagulation Profile: Recent Labs  Lab 03/05/18 1948  INR 0.97   Cardiac Enzymes: No results for input(s): CKTOTAL, CKMB, CKMBINDEX, TROPONINI in the last 168 hours. BNP (last 3 results) No results for input(s): PROBNP in the last 8760 hours. HbA1C: Recent Labs    03/06/18 0223  HGBA1C 5.6   CBG: No results for input(s): GLUCAP in the last 168 hours. Lipid Profile: Recent Labs    03/06/18 0223  CHOL 172  HDL 77  LDLCALC 75  TRIG 100  CHOLHDL 2.2   Thyroid Function Tests: No results for input(s): TSH, T4TOTAL, FREET4, T3FREE, THYROIDAB in the last 72 hours. Anemia Panel: No results for input(s): VITAMINB12, FOLATE, FERRITIN, TIBC, IRON, RETICCTPCT in the last 72 hours. Sepsis Labs: No results for input(s): PROCALCITON, LATICACIDVEN in the last 168 hours.  No results found for this or any previous visit (from the past 240 hour(s)).       Radiology Studies: Ct Angio Head W Or Wo Contrast  Result Date: 03/06/2018 CLINICAL DATA:  52 y/o  M; stroke for follow-up. EXAM: CT ANGIOGRAPHY HEAD AND NECK TECHNIQUE: Multidetector CT imaging of the head and neck was performed using the standard protocol during bolus  administration of intravenous contrast. Multiplanar CT image reconstructions and MIPs were obtained to evaluate the vascular anatomy. Carotid stenosis measurements (when applicable) are obtained utilizing NASCET criteria, using the distal internal carotid diameter as the denominator. CONTRAST:  50mL ISOVUE-370 IOPAMIDOL (ISOVUE-370) INJECTION 76% COMPARISON:  03/05/2018 MRI of the head. FINDINGS: CTA NECK FINDINGS Aortic arch: Bovine variant branching. Imaged portion shows no evidence of aneurysm or dissection. No significant stenosis of the major arch vessel origins. Right carotid system: No evidence of dissection, stenosis (50% or greater) or occlusion. Right carotid bifurcation mixed plaque minimal less than 30% proximal ICA stenosis. Left carotid system: No evidence of dissection, stenosis (50% or greater) or occlusion. Left carotid bifurcation mixed plaque with minimal less than 30% proximal ICA stenosis. Vertebral arteries: Codominant. No evidence of dissection, stenosis (50% or greater) or occlusion. Skeleton: Negative. Other  neck: Negative. Upper chest: Negative. Review of the MIP images confirms the above findings CTA HEAD FINDINGS Anterior circulation: No significant stenosis, proximal occlusion, aneurysm, or vascular malformation. Mild non stenotic calcific atherosclerosis of carotid siphons. Posterior circulation: No significant stenosis, proximal occlusion, aneurysm, or vascular malformation. Moderate right P2 stenosis. Venous sinuses: As permitted by contrast timing, patent. Anatomic variants: Anterior communicating artery. No posterior communicating artery identified, likely hypoplastic or absent. Delayed phase: No abnormal intracranial enhancement. Review of the MIP images confirms the above findings IMPRESSION: 1. Patent carotid and vertebral arteries. No dissection, aneurysm, or hemodynamically significant stenosis utilizing NASCET criteria. 2. Patent anterior and posterior intracranial  circulation. No large vessel occlusion, aneurysm, or high-grade stenosis. 3. Moderate right P2 PCA stenosis. 4. Non stenotic stenotic atherosclerosis of carotid bifurcations and carotid siphons. Electronically Signed   By: Mitzi Hansen M.D.   On: 03/06/2018 02:32   Ct Head Wo Contrast  Result Date: 03/05/2018 CLINICAL DATA:  Left upper and lower extremity weakness for approximately 24 hours EXAM: CT HEAD WITHOUT CONTRAST TECHNIQUE: Contiguous axial images were obtained from the base of the skull through the vertex without intravenous contrast. COMPARISON:  None. FINDINGS: Brain: The ventricles are normal in size and duration. There is no demonstrable mass, hemorrhage, extra-axial fluid collection, or midline shift. There is evidence of localized encephalomalacia in the mid right frontal lobe, likely due to prior infarct. Adjacent to this encephalomalacia, there Is apparent cytotoxic edema at the gray-white junction of the right mid frontal lobe, suspicious for recent and probably acute infarct adjacent to an area of encephalomalacia. This edema extends into the anterior superior right centrum semiovale, felt to be part of this recent, likely acute infarct. Elsewhere, the brain parenchyma appears unremarkable. Vascular: No evident hyperdense vessel. There is calcification in each carotid siphon region. Skull: Bony calvarium appears intact. Sinuses/Orbits: There is mucosal thickening in several ethmoid air cells. Other visualized paranasal sinuses are clear. Visualized orbits appear symmetric bilaterally. Other: Mastoid air cells are clear. IMPRESSION: 1. Cytotoxic edema in the mid right frontal lobe extending into the adjacent anterior superior right centrum semiovale. This appearance is felt to represent probable acute infarct immediately adjacent to an older infarct with encephalomalacia in the periphery of the mid right frontal lobe. No other evident acute infarct. No mass or hemorrhage. Brain  parenchyma otherwise appears unremarkable. 2.  There is mucosal thickening in several ethmoid air cells. Electronically Signed   By: Bretta Bang III M.D.   On: 03/05/2018 19:48   Ct Angio Neck W Or Wo Contrast  Result Date: 03/06/2018 CLINICAL DATA:  52 y/o  M; stroke for follow-up. EXAM: CT ANGIOGRAPHY HEAD AND NECK TECHNIQUE: Multidetector CT imaging of the head and neck was performed using the standard protocol during bolus administration of intravenous contrast. Multiplanar CT image reconstructions and MIPs were obtained to evaluate the vascular anatomy. Carotid stenosis measurements (when applicable) are obtained utilizing NASCET criteria, using the distal internal carotid diameter as the denominator. CONTRAST:  50mL ISOVUE-370 IOPAMIDOL (ISOVUE-370) INJECTION 76% COMPARISON:  03/05/2018 MRI of the head. FINDINGS: CTA NECK FINDINGS Aortic arch: Bovine variant branching. Imaged portion shows no evidence of aneurysm or dissection. No significant stenosis of the major arch vessel origins. Right carotid system: No evidence of dissection, stenosis (50% or greater) or occlusion. Right carotid bifurcation mixed plaque minimal less than 30% proximal ICA stenosis. Left carotid system: No evidence of dissection, stenosis (50% or greater) or occlusion. Left carotid bifurcation mixed plaque with minimal less than  30% proximal ICA stenosis. Vertebral arteries: Codominant. No evidence of dissection, stenosis (50% or greater) or occlusion. Skeleton: Negative. Other neck: Negative. Upper chest: Negative. Review of the MIP images confirms the above findings CTA HEAD FINDINGS Anterior circulation: No significant stenosis, proximal occlusion, aneurysm, or vascular malformation. Mild non stenotic calcific atherosclerosis of carotid siphons. Posterior circulation: No significant stenosis, proximal occlusion, aneurysm, or vascular malformation. Moderate right P2 stenosis. Venous sinuses: As permitted by contrast timing,  patent. Anatomic variants: Anterior communicating artery. No posterior communicating artery identified, likely hypoplastic or absent. Delayed phase: No abnormal intracranial enhancement. Review of the MIP images confirms the above findings IMPRESSION: 1. Patent carotid and vertebral arteries. No dissection, aneurysm, or hemodynamically significant stenosis utilizing NASCET criteria. 2. Patent anterior and posterior intracranial circulation. No large vessel occlusion, aneurysm, or high-grade stenosis. 3. Moderate right P2 PCA stenosis. 4. Non stenotic stenotic atherosclerosis of carotid bifurcations and carotid siphons. Electronically Signed   By: Mitzi Hansen M.D.   On: 03/06/2018 02:32   Mr Brain Wo Contrast  Result Date: 03/05/2018 CLINICAL DATA:  52 y/o  M; left-sided weakness. EXAM: MRI HEAD WITHOUT CONTRAST TECHNIQUE: Multiplanar, multiecho pulse sequences of the brain and surrounding structures were obtained without intravenous contrast. COMPARISON:  03/05/2018 CT head. FINDINGS: Brain: Small region of cortical reduced diffusion within the right posteromedial frontal lobe inclusive of precentral gyrus compatible with acute/early subacute infarction (series 5, image 89). No associated hemorrhage or mass effect. No extra-axial collection, hydrocephalus, or herniation. Small chronic cortical infarctions are present within the lateral aspect of frontal lobes bilaterally. And there are very small chronic infarctions within the high right parietal and left posterior temporal cortices. Mild-to-moderate volume loss of the brain for age. No abnormal susceptibility hypointensity to indicate intracranial hemorrhage. Vascular: Normal flow voids. Skull and upper cervical spine: Normal marrow signal. Sinuses/Orbits: Mild ethmoid sinus mucosal thickening. No abnormal signal of mastoid air cells. Orbits are unremarkable Other: None. IMPRESSION: 1. Small acute/early subacute infarction within the right  posteromedial frontal lobe inclusive of precentral gyrus. No associated hemorrhage or mass effect. 2. Multiple small chronic infarctions within the bilateral frontal lobes, right parietal lobe, and left posterior temporal lobe. 3. Mild to moderate volume loss of the brain for age. These results will be called to the ordering clinician or representative by the Radiologist Assistant, and communication documented in the PACS or zVision Dashboard. Electronically Signed   By: Mitzi Hansen M.D.   On: 03/05/2018 23:43        Scheduled Meds: . aspirin  300 mg Rectal Daily   Or  . aspirin  325 mg Oral Daily  . atorvastatin  80 mg Oral q1800  . enoxaparin (LOVENOX) injection  40 mg Subcutaneous Daily  . folic acid  1 mg Oral Daily  . LORazepam  0-4 mg Intravenous Q6H   Followed by  . [START ON 03/08/2018] LORazepam  0-4 mg Intravenous Q12H  . multivitamin with minerals  1 tablet Oral Daily  . thiamine  100 mg Oral Daily   Or  . thiamine  100 mg Intravenous Daily   Continuous Infusions:   LOS: 1 day    Time spent: 32 min    Kathlen Mody, MD [facility] Pager [phone]  If 7PM-7AM, please contact night-coverage [email or web address] Password [email or web address] 03/07/2018, 11:28 AM

## 2018-03-07 NOTE — Progress Notes (Signed)
STROKE TEAM PROGRESS NOTE       SUBJECTIVE (INTERVAL HISTORY) His family is not at bedside. He he still has left hemiparesis but most weakness in the left hand which is unchanged   OBJECTIVE Vitals:   03/07/18 0300 03/07/18 0736 03/07/18 1120 03/07/18 1546  BP: (!) 147/90 (!) 160/87 (!) 142/81 136/77  Pulse: 64 62 73 70  Resp: 18  18 18   Temp: (!) 97.5 F (36.4 C) 97.9 F (36.6 C) 98.7 F (37.1 C) 98.1 F (36.7 C)  TempSrc: Oral Oral Oral   SpO2: 100% 100% 100%   Weight:      Height:        CBC:  Recent Labs  Lab 03/05/18 1948 03/05/18 2008 03/07/18 0715  WBC 12.2*  --  11.8*  NEUTROABS 6.6  --   --   HGB 15.9 16.7 14.4  HCT 49.2 49.0 43.5  MCV 94.6  --  92.6  PLT 397  --  342    Basic Metabolic Panel:  Recent Labs  Lab 03/05/18 1948 03/05/18 2008 03/07/18 0715  NA 144 147* 138  K 4.0 4.2 4.2  CL 108 107 106  CO2 27  --  26  GLUCOSE 100* 97 93  BUN 13 16 11   CREATININE 1.33* 1.60* 1.29*  CALCIUM 9.3  --  9.3    Lipid Panel:     Component Value Date/Time   CHOL 172 03/06/2018 0223   TRIG 100 03/06/2018 0223   HDL 77 03/06/2018 0223   CHOLHDL 2.2 03/06/2018 0223   VLDL 20 03/06/2018 0223   LDLCALC 75 03/06/2018 0223   HgbA1c:  Lab Results  Component Value Date   HGBA1C 5.6 03/06/2018   Urine Drug Screen: No results found for: LABOPIA, COCAINSCRNUR, LABBENZ, AMPHETMU, THCU, LABBARB  Alcohol Level No results found for: ETH  IMAGING  Ct Angio Head W Or Wo Contrast Ct Angio Neck W Or Wo Contrast 03/06/2018 IMPRESSION:  1. Patent carotid and vertebral arteries. No dissection, aneurysm, or hemodynamically significant stenosis utilizing NASCET criteria.  2. Patent anterior and posterior intracranial circulation. No large vessel occlusion, aneurysm, or high-grade stenosis.  3. Moderate right P2 PCA stenosis.  4. Non stenotic stenotic atherosclerosis of carotid bifurcations and carotid siphons.    Ct Head Wo Contrast 03/05/2018 IMPRESSION:   1. Cytotoxic edema in the mid right frontal lobe extending into the adjacent anterior superior right centrum semiovale. This appearance is felt to represent probable acute infarct immediately adjacent to an older infarct with encephalomalacia in the periphery of the mid right frontal lobe. No other evident acute infarct. No mass or hemorrhage. Brain parenchyma otherwise appears unremarkable.  2.  There is mucosal thickening in several ethmoid air cells.    Mr Brain Wo Contrast 03/05/2018 IMPRESSION:  1. Small acute/early subacute infarction within the right posteromedial frontal lobe inclusive of precentral gyrus. No associated hemorrhage or mass effect.  2. Multiple small chronic infarctions within the bilateral frontal lobes, right parietal lobe, and left posterior temporal lobe.  3. Mild to moderate volume loss of the brain for age.     Transthoracic Echocardiogram  03/06/2018 Study Conclusions - Left ventricle: The cavity size was normal. Wall thickness was   normal. Systolic function was normal. The estimated ejection   fraction was in the range of 55% to 60%. Wall motion was normal;   there were no regional wall motion abnormalities. Doppler   parameters are consistent with abnormal left ventricular   relaxation (grade 1 diastolic  dysfunction).    PHYSICAL EXAM Blood pressure 136/77, pulse 70, temperature 98.1 F (36.7 C), resp. rate 18, height 5\' 6"  (1.676 m), weight 74.8 kg, SpO2 100 %.   PHYSICAL EXAM Physical exam: Exam: Gen: NAD Eyes: anicteric sclerae, moist conjunctivae                    CV: no MRG, no carotid bruits, no peripheral edema Mental Status: Alert, follows commands, good historian  Neuro: Detailed Neurologic Exam  Speech:    No aphasia, no dysarthria. Naming and repetition intact.  Cranial Nerves:    The pupils are equal, round, and reactive to light.. Attempted, Fundi not visualized.  EOMI.  Visual fields full. Left lower facial mild weakness,  Tongue midline, uvula midline, Hearing intact to voice. Shoulder shrug intact  Motor Observation:    no involuntary movements noted. Tone appears normal.     Strength:  Left arm 3-/5, able to lift off the bed with drift, significantweakness of left hand grip. Lower extremity is 4/5. Right is 5/5.   Sensation: Decreased left arm >> left leg  Coordination: No dysmetria with right limbs. Unable on the left due to weakness. ASSESSMENT/PLAN [name] is a 52 y.o. male with history of previous strokes by imaging, tobacco use and hypertension, not on any antihypertensives at this time  presenting with sudden onset of left arm and leg weakness. He did not receive IV t-PA due to late presentation.  Stroke: Rt frontal lobe infarct - embolic given multiple old strokes in multiple distributions.  Resultant  Left hemiparesis, mild left NS flattening  CT head - Cytotoxic edema in the mid right frontal lobe extending into the adjacent anterior superior right centrum semiovale. This appearance is felt to represent probable acute infarct immediately adjacent to an older infarct with encephalomalacia in the periphery of the mid right frontal lobe  MRI head - Small acute/early subacute infarction within the right posteromedial frontal lobe inclusive of precentral gyrus. Multiple old infarcts.  Will need TEE and loop  MRA head - not performed  CTA H&N - no high grade stenosis  Carotid Doppler - CTA neck performed or pending - carotid dopplers not indicated.  2D Echo  - EF 55 - 60%. No cardiac source of emboli identified.   LDL - 75  HgbA1c - 5.6  VTE prophylaxis - Lovenox  Diet - Heart healthy with thin liquids.  No antithrombotic prior to admission, now on aspirin 325 mg daily  Patient counseled to be compliant with his antithrombotic medications  Ongoing aggressive stroke risk factor management  Therapy recommendations:  Inpatient rehabilitation  Disposition:   Pending  Hypertension  Stable . Permissive hypertension (OK if < 220/120) but gradually normalize in 5-7 days . Long-term BP goal normotensive  Hyperlipidemia  Lipid lowering medication PTA:  none  LDL 75, goal < 70  Current lipid lowering medication: Now on Lipitor 80 mg daily.   Continue statin at discharge   Other Stroke Risk Factors  Cigarette smoker - advised to stop smoking  ETOH use, advised to drink no more than 1 alcoholic beverage per day.  Previous strokes by imaging  Other Active Problems  Mild leukocytosis  Elevated creatinine 1.6  Plan  Await therapy evaluations.   Will need TEE / loop    Hospital day # 1  Personally examined patient and images, and have participated in and made any corrections needed to history, physical, neuro exam,assessment and plan as stated above.  I  have personally obtained the history, evaluated lab date, reviewed imaging studies and agree with radiology interpretations.    Delia Heady, MD Stroke Neurology   To contact Stroke Continuity provider, please refer to [email or web address]. After hours, contact General Neurology

## 2018-03-07 NOTE — Care Management Note (Addendum)
Case Management Note  Patient Details  Name: Jesse Price MRN: [id number] Date of Birth: 1965/12/23  Subjective/Objective:   Pt admitted with CVA. He is from home with his sister.  No DME at home.  Pt wasn't taking any medications at home. Pt feels with his insurance he would be able to afford medications. Pt denies any transportation issues.      Pt states he saw Dr Joselyn Arrow in April as his PCP.              Action/Plan: Recommendations are for CIR. CM following.   Expected Discharge Date:                  Expected Discharge Plan:  IP Rehab Facility  In-House Referral:     Discharge planning Services  CM Consult  Post Acute Care Choice:    Choice offered to:     DME Arranged:    DME Agency:     HH Arranged:    HH Agency:     Status of Service:  In process, will continue to follow  If discussed at Long Length of Stay Meetings, dates discussed:    Additional Comments:  Kermit Balo, RN 03/07/2018, 2:18 PM

## 2018-03-07 NOTE — Evaluation (Addendum)
Occupational Therapy Evaluation Patient Details Name: Jesse Price MRN: [id number] DOB: February 01, 1966 Today's Date: 03/07/2018    History of Present Illness Pt is a 52 y.o. male past medical history of hypertension, not on any antihypertensives at this time, was in usual state of health yesterday and the last known normal around 5:30 PM on 03/04/2018 when he had sudden onset of left arm and leg weakness.  MRI reveals: Small acute/early subacute infarction within the right posteromedial frontal lobe inclusive of precentral gyrus. Multiple old infarcts.   Clinical Impression   PTA patient independent, working and driving.  He was admitted for above and limited by decreased activity tolerance, L sided hemiparesis (UE>LE), impaired functional use of dominant UE, impaired balance, decreased problem solving, motor planning/initation, impaired vision, and safety.  Patient demonstrates ability to complete grooming seated with setup assist, UB self care with mod assist, LB self care with max assist, and toilet transfers with mod assist.  He will benefit from continued OT services while admitted and after dc at CIR level in order to optimize independence and return to PLOF.  Will continue to follow.     Follow Up Recommendations  CIR    Equipment Recommendations  Other (comment)(TBD at next venue of care)    Recommendations for Other Services Rehab consult     Precautions / Restrictions Precautions Precautions: Fall Restrictions Weight Bearing Restrictions: No      Mobility Bed Mobility Overal bed mobility: Needs Assistance Bed Mobility: Supine to Sit     Supine to sit: Min guard;HOB elevated     General bed mobility comments: min guard towards L side with increased time and effort   Transfers Overall transfer level: Needs assistance Equipment used: None Transfers: Sit to/from [employer] Sit to Stand: Min assist Stand pivot transfers: Mod assist       General  transfer comment: min assist for safety and balance, cueing for hand placement and sequencing; mod assist for stand pivot while standing and turning to recliner     Balance Overall balance assessment: Needs assistance Sitting-balance support: No upper extremity supported;Feet supported Sitting balance-Leahy Scale: Fair     Standing balance support: Single extremity supported;During functional activity Standing balance-Leahy Scale: Poor Standing balance comment: reliant on 1 UE and external support                           ADL either performed or assessed with clinical judgement   ADL Overall ADL's : Needs assistance/impaired Eating/Feeding: NPO   Grooming: Set up;Sitting   Upper Body Bathing: Minimal assistance;Sitting   Lower Body Bathing: Moderate assistance;Sit to/from stand   Upper Body Dressing : Moderate assistance;Sitting   Lower Body Dressing: Maximal assistance;Sit to/from stand   Toilet Transfer: Moderate assistance;Stand-pivot(simulated to recliner )           Functional mobility during ADLs: Moderate assistance General ADL Comments: Pt eager and motivated.  Pt with decreased functional use and ROM of L UE/LE, requires R UE support in standing.      Vision Baseline Vision/History: Wears glasses Wears Glasses: At all times Patient Visual Report: Blurring of vision Vision Assessment?: Yes Eye Alignment: Within Functional Limits Ocular Range of Motion: Within Functional Limits Alignment/Gaze Preference: Within Defined Limits Tracking/Visual Pursuits: Unable to hold eye position out of midline Convergence: Within functional limits Visual Fields: Other (comment)(requires head and eye turns, difficult to clearly assess)     Perception     Praxis  Pertinent Vitals/Pain Pain Assessment: No/denies pain     Hand Dominance Left   Extremity/Trunk Assessment Upper Extremity Assessment Upper Extremity Assessment: LUE deficits/detail LUE  Deficits / Details: Hemiparetic UE: grossly MMT 3-/5 shoulder FF, elbow flexion, finger extension;  decreased functional use and strength, unable to initate grasp consistently LUE Sensation: WNL LUE Coordination: decreased fine motor;decreased gross motor   Lower Extremity Assessment Lower Extremity Assessment: Defer to PT evaluation       Communication Communication Communication: No difficulties   Cognition Arousal/Alertness: Awake/alert Behavior During Therapy: WFL for tasks assessed/performed Overall Cognitive Status: Impaired/Different from baseline Area of Impairment: Memory;Attention;Following commands;Safety/judgement;Awareness;Problem solving                   Current Attention Level: Sustained Memory: Decreased recall of precautions;Decreased short-term memory Following Commands: Follows one step commands inconsistently;Follows one step commands with increased time Safety/Judgement: Decreased awareness of safety Awareness: Emergent Problem Solving: Slow processing;Decreased initiation;Difficulty sequencing;Requires verbal cues General Comments: pts reports no changes in memory, but sister reports decreased STM   General Comments  sister present and supportive    Exercises     Shoulder Instructions      Home Living Family/patient expects to be discharged to:: Private residence Living Arrangements: Other relatives(sister) Available Help at Discharge: Family;Available PRN/intermittently Type of Home: House Home Access: Stairs to enter [employer] of Steps: 3 Entrance Stairs-Rails: None Home Layout: One level     Bathroom Shower/Tub: Chief Strategy Officer: Handicapped height     Home Equipment: None          Prior Functioning/Environment Level of Independence: Independent        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)         OT Problem List: Decreased strength;Decreased activity tolerance;Impaired balance  (sitting and/or standing);Impaired vision/perception;Decreased coordination;Decreased cognition;Decreased safety awareness;Decreased knowledge of use of DME or AE;Decreased knowledge of precautions;Impaired UE functional use      OT Treatment/Interventions: Self-care/ADL training;Therapeutic exercise;Neuromuscular education;Energy conservation;DME and/or AE instruction;Therapeutic activities;Cognitive remediation/compensation;Visual/perceptual remediation/compensation;Patient/family education;Balance training    OT Goals(Current goals can be found in the care plan section) Acute Rehab OT Goals Patient Stated Goal: to use my left arm  OT Goal Formulation: With patient Time For Goal Achievement: 03/21/18 Potential to Achieve Goals: Good  OT Frequency: Min 3X/week   Barriers to D/C:            Co-evaluation              AM-PAC PT "6 Clicks" Daily Activity     Outcome Measure Help from another person eating meals?: A Little Help from another person taking care of personal grooming?: A Little Help from another person toileting, which includes using toliet, bedpan, or urinal?: A Lot Help from another person bathing (including washing, rinsing, drying)?: A Lot Help from another person to put on and taking off regular upper body clothing?: A Lot Help from another person to put on and taking off regular lower body clothing?: A Lot 6 Click Score: 14   End of Session Equipment Utilized During Treatment: Gait belt Nurse Communication: Mobility status;Other (comment)(pt needs)  Activity Tolerance: Patient tolerated treatment well Patient left: in chair;with call bell/phone within reach;with chair alarm set;with family/visitor present  OT Visit Diagnosis: Unsteadiness on feet (R26.81);Other abnormalities of gait and mobility (R26.89);Muscle weakness (generalized) (M62.81);Other symptoms and signs involving the nervous system (R29.898);Other symptoms and signs involving cognitive  function;Hemiplegia and hemiparesis Hemiplegia - Right/Left: Left Hemiplegia - dominant/non-dominant: Dominant Hemiplegia -  caused by: Cerebral infarction                Time: 1610-9604 OT Time Calculation (min): 24 min Charges:  OT General Charges $OT Visit: 1 Visit OT Evaluation $OT Eval Moderate Complexity: 1 Mod OT Treatments $Self Care/Home Management : 8-22 mins  Chancy Milroy, OT Acute Rehabilitation Services Pager [phone] Office [phone]   Chancy Milroy 03/07/2018, 10:34 AM

## 2018-03-07 NOTE — H&P (View-Only) (Signed)
STROKE TEAM PROGRESS NOTE       SUBJECTIVE (INTERVAL HISTORY) His family is not at bedside. He he still has left hemiparesis but most weakness in the left hand which is unchanged   OBJECTIVE Vitals:   03/07/18 0300 03/07/18 0736 03/07/18 1120 03/07/18 1546  BP: (!) 147/90 (!) 160/87 (!) 142/81 136/77  Pulse: 64 62 73 70  Resp: 18  18 18   Temp: (!) 97.5 F (36.4 C) 97.9 F (36.6 C) 98.7 F (37.1 C) 98.1 F (36.7 C)  TempSrc: Oral Oral Oral   SpO2: 100% 100% 100%   Weight:      Height:        CBC:  Recent Labs  Lab 03/05/18 1948 03/05/18 2008 03/07/18 0715  WBC 12.2*  --  11.8*  NEUTROABS 6.6  --   --   HGB 15.9 16.7 14.4  HCT 49.2 49.0 43.5  MCV 94.6  --  92.6  PLT 397  --  342    Basic Metabolic Panel:  Recent Labs  Lab 03/05/18 1948 03/05/18 2008 03/07/18 0715  NA 144 147* 138  K 4.0 4.2 4.2  CL 108 107 106  CO2 27  --  26  GLUCOSE 100* 97 93  BUN 13 16 11   CREATININE 1.33* 1.60* 1.29*  CALCIUM 9.3  --  9.3    Lipid Panel:     Component Value Date/Time   CHOL 172 03/06/2018 0223   TRIG 100 03/06/2018 0223   HDL 77 03/06/2018 0223   CHOLHDL 2.2 03/06/2018 0223   VLDL 20 03/06/2018 0223   LDLCALC 75 03/06/2018 0223   HgbA1c:  Lab Results  Component Value Date   HGBA1C 5.6 03/06/2018   Urine Drug Screen: No results found for: LABOPIA, COCAINSCRNUR, LABBENZ, AMPHETMU, THCU, LABBARB  Alcohol Level No results found for: ETH  IMAGING  Ct Angio Head W Or Wo Contrast Ct Angio Neck W Or Wo Contrast 03/06/2018 IMPRESSION:  1. Patent carotid and vertebral arteries. No dissection, aneurysm, or hemodynamically significant stenosis utilizing NASCET criteria.  2. Patent anterior and posterior intracranial circulation. No large vessel occlusion, aneurysm, or high-grade stenosis.  3. Moderate right P2 PCA stenosis.  4. Non stenotic stenotic atherosclerosis of carotid bifurcations and carotid siphons.    Ct Head Wo Contrast 03/05/2018 IMPRESSION:   1. Cytotoxic edema in the mid right frontal lobe extending into the adjacent anterior superior right centrum semiovale. This appearance is felt to represent probable acute infarct immediately adjacent to an older infarct with encephalomalacia in the periphery of the mid right frontal lobe. No other evident acute infarct. No mass or hemorrhage. Brain parenchyma otherwise appears unremarkable.  2.  There is mucosal thickening in several ethmoid air cells.    Mr Brain Wo Contrast 03/05/2018 IMPRESSION:  1. Small acute/early subacute infarction within the right posteromedial frontal lobe inclusive of precentral gyrus. No associated hemorrhage or mass effect.  2. Multiple small chronic infarctions within the bilateral frontal lobes, right parietal lobe, and left posterior temporal lobe.  3. Mild to moderate volume loss of the brain for age.     Transthoracic Echocardiogram  03/06/2018 Study Conclusions - Left ventricle: The cavity size was normal. Wall thickness was   normal. Systolic function was normal. The estimated ejection   fraction was in the range of 55% to 60%. Wall motion was normal;   there were no regional wall motion abnormalities. Doppler   parameters are consistent with abnormal left ventricular   relaxation (grade 1 diastolic  dysfunction).    PHYSICAL EXAM Blood pressure 136/77, pulse 70, temperature 98.1 F (36.7 C), resp. rate 18, height 5\' 6"  (1.676 m), weight 74.8 kg, SpO2 100 %.   PHYSICAL EXAM Physical exam: Exam: Gen: NAD Eyes: anicteric sclerae, moist conjunctivae                    CV: no MRG, no carotid bruits, no peripheral edema Mental Status: Alert, follows commands, good historian  Neuro: Detailed Neurologic Exam  Speech:    No aphasia, no dysarthria. Naming and repetition intact.  Cranial Nerves:    The pupils are equal, round, and reactive to light.. Attempted, Fundi not visualized.  EOMI.  Visual fields full. Left lower facial mild weakness,  Tongue midline, uvula midline, Hearing intact to voice. Shoulder shrug intact  Motor Observation:    no involuntary movements noted. Tone appears normal.     Strength:  Left arm 3-/5, able to lift off the bed with drift, significantweakness of left hand grip. Lower extremity is 4/5. Right is 5/5.   Sensation: Decreased left arm >> left leg  Coordination: No dysmetria with right limbs. Unable on the left due to weakness. ASSESSMENT/PLAN [name] is a 52 y.o. male with history of previous strokes by imaging, tobacco use and hypertension, not on any antihypertensives at this time  presenting with sudden onset of left arm and leg weakness. He did not receive IV t-PA due to late presentation.  Stroke: Rt frontal lobe infarct - embolic given multiple old strokes in multiple distributions.  Resultant  Left hemiparesis, mild left NS flattening  CT head - Cytotoxic edema in the mid right frontal lobe extending into the adjacent anterior superior right centrum semiovale. This appearance is felt to represent probable acute infarct immediately adjacent to an older infarct with encephalomalacia in the periphery of the mid right frontal lobe  MRI head - Small acute/early subacute infarction within the right posteromedial frontal lobe inclusive of precentral gyrus. Multiple old infarcts.  Will need TEE and loop  MRA head - not performed  CTA H&N - no high grade stenosis  Carotid Doppler - CTA neck performed or pending - carotid dopplers not indicated.  2D Echo  - EF 55 - 60%. No cardiac source of emboli identified.   LDL - 75  HgbA1c - 5.6  VTE prophylaxis - Lovenox  Diet - Heart healthy with thin liquids.  No antithrombotic prior to admission, now on aspirin 325 mg daily  Patient counseled to be compliant with his antithrombotic medications  Ongoing aggressive stroke risk factor management  Therapy recommendations:  Inpatient rehabilitation  Disposition:   Pending  Hypertension  Stable . Permissive hypertension (OK if < 220/120) but gradually normalize in 5-7 days . Long-term BP goal normotensive  Hyperlipidemia  Lipid lowering medication PTA:  none  LDL 75, goal < 70  Current lipid lowering medication: Now on Lipitor 80 mg daily.   Continue statin at discharge   Other Stroke Risk Factors  Cigarette smoker - advised to stop smoking  ETOH use, advised to drink no more than 1 alcoholic beverage per day.  Previous strokes by imaging  Other Active Problems  Mild leukocytosis  Elevated creatinine 1.6  Plan  Await therapy evaluations.   Will need TEE / loop    Hospital day # 1  Personally examined patient and images, and have participated in and made any corrections needed to history, physical, neuro exam,assessment and plan as stated above.  I  have personally obtained the history, evaluated lab date, reviewed imaging studies and agree with radiology interpretations.    Delia Heady, MD Stroke Neurology   To contact Stroke Continuity provider, please refer to [email or web address]. After hours, contact General Neurology

## 2018-03-07 NOTE — Evaluation (Signed)
Physical Therapy Evaluation Patient Details Name: Jesse Price MRN: [id number] DOB: [birth year] Today's Date: 03/07/2018   History of Present Illness  Pt is a 52 y.o. male past medical history of hypertension, not on any antihypertensives at this time, was in usual state of health yesterday and the last known normal around 5:30 PM on 03/04/2018 when he had sudden onset of left arm and leg weakness.  MRI reveals: Small acute/early subacute infarction within the right posteromedial frontal lobe inclusive of precentral gyrus. Multiple old infarcts.    Clinical Impression  Mr. Vowles admitted with the above listed diagnosis. Patient reporting independence with all mobility prior to admission. Patient today requiring Min-Mod A for transfers and mobility with required 1 HHA for all OOB mobility. Patient with poor weight shift/acceptance onto L LE as well as reduced balance, safety, strength, and general functional mobility. Will recommend CIR level therapies at discharge to progress safe and independent functional mobility. PT to follow acutely.     Follow Up Recommendations CIR    Equipment Recommendations  Other (comment)(defer)    Recommendations for Other Services Rehab consult     Precautions / Restrictions Precautions Precautions: Fall Restrictions Weight Bearing Restrictions: No      Mobility  Bed Mobility Overal bed mobility: Needs Assistance Bed Mobility: Supine to Sit     Supine to sit: Min guard;HOB elevated     General bed mobility comments: seated in middle of bed; increased time and effort to come to EOB with cueing required  Transfers Overall transfer level: Needs assistance Equipment used: 1 person hand held assist Transfers: Sit to/from Stand Sit to Stand: Min assist         General transfer comment: min A to power up from bedside x 2 - Min A for balance and steadying  Ambulation/Gait Ambulation/Gait assistance: Mod assist;Min assist Gait Distance  (Feet): 15 Feet Assistive device: 1 person hand held assist Gait Pattern/deviations: Step-to pattern;Decreased stride length;Decreased weight shift to left;Decreased stance time - left;Decreased dorsiflexion - left;Trunk flexed Gait velocity: decreased   General Gait Details: cueing for weight shift and to progress step length  Stairs            Wheelchair Mobility    Modified Rankin (Stroke Patients Only) Modified Rankin (Stroke Patients Only) Pre-Morbid Rankin Score: No symptoms Modified Rankin: Moderately severe disability     Balance Overall balance assessment: Needs assistance Sitting-balance support: No upper extremity supported;Feet supported Sitting balance-Leahy Scale: Fair     Standing balance support: Single extremity supported;During functional activity Standing balance-Leahy Scale: Poor Standing balance comment: reliant on UE support                             Pertinent Vitals/Pain Pain Assessment: No/denies pain    Home Living Family/patient expects to be discharged to:: Private residence Living Arrangements: Other relatives(sister) Available Help at Discharge: Family;Available PRN/intermittently Type of Home: House Home Access: Stairs to enter Entrance Stairs-Rails: None Entrance Stairs-Number of Steps: 3 Home Layout: One level Home Equipment: None      Prior Function Level of Independence: Independent         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)      Higher education careers adviser   Dominant Hand: Left    Extremity/Trunk Assessment   Upper Extremity Assessment Upper Extremity Assessment: Defer to OT evaluation    Lower Extremity Assessment Lower Extremity Assessment: LLE deficits/detail LLE Deficits / Details: L LE grossly 3-/5 with  hip flexion most limited LLE Sensation: WNL LLE Coordination: decreased fine motor;decreased gross motor    Cervical / Trunk Assessment Cervical / Trunk Assessment: Normal  Communication    Communication: No difficulties  Cognition Arousal/Alertness: Awake/alert Behavior During Therapy: WFL for tasks assessed/performed Overall Cognitive Status: Impaired/Different from baseline Area of Impairment: Memory;Attention;Following commands;Safety/judgement;Awareness;Problem solving                   Current Attention Level: Sustained Memory: Decreased recall of precautions;Decreased short-term memory Following Commands: Follows one step commands inconsistently;Follows one step commands with increased time Safety/Judgement: Decreased awareness of safety Awareness: Emergent Problem Solving: Slow processing;Decreased initiation;Difficulty sequencing;Requires verbal cues General Comments: pts reports no changes in memory, but sister reports decreased STM      General Comments General comments (skin integrity, edema, etc.): sisters and neice present and supportive    Exercises     Assessment/Plan    PT Assessment Patient needs continued PT services  PT Problem List Decreased strength;Decreased activity tolerance;Decreased balance;Decreased mobility;Decreased knowledge of use of DME;Decreased safety awareness       PT Treatment Interventions DME instruction;Gait training;Stair training;Functional mobility training;Therapeutic activities;Therapeutic exercise;Balance training;Neuromuscular re-education;Patient/family education    PT Goals (Current goals can be found in the Care Plan section)  Acute Rehab PT Goals Patient Stated Goal: to use my left arm  PT Goal Formulation: With patient Time For Goal Achievement: 03/21/18 Potential to Achieve Goals: Good    Frequency Min 4X/week   Barriers to discharge        Co-evaluation               AM-PAC PT "6 Clicks" Daily Activity  Outcome Measure Difficulty turning over in bed (including adjusting bedclothes, sheets and blankets)?: A Little Difficulty moving from lying on back to sitting on the side of the bed?  : A Little Difficulty sitting down on and standing up from a chair with arms (e.g., wheelchair, bedside commode, etc,.)?: Unable Help needed moving to and from a bed to chair (including a wheelchair)?: A Little Help needed walking in hospital room?: A Little Help needed climbing 3-5 steps with a railing? : A Lot 6 Click Score: 15    End of Session Equipment Utilized During Treatment: Gait belt Activity Tolerance: Patient tolerated treatment well Patient left: in bed;with call bell/phone within reach;with family/visitor present Nurse Communication: Mobility status PT Visit Diagnosis: Unsteadiness on feet (R26.81);Other abnormalities of gait and mobility (R26.89);Muscle weakness (generalized) (M62.81)    Time: 4098-1191 PT Time Calculation (min) (ACUTE ONLY): 24 min   Charges:   PT Evaluation $PT Eval Moderate Complexity: 1 Mod PT Treatments $Gait Training: 8-22 mins        Kipp Laurence, PT, DPT Supplemental Physical Therapist 03/07/18 1:55 PM Pager: [phone] Office: [phone]

## 2018-03-07 NOTE — Progress Notes (Signed)
    [facility] has been requested to perform a transesophageal echocardiogram on Jesse Price for stroke.  After careful review of history and examination, the risks and benefits of transesophageal echocardiogram have been explained including risks of esophageal damage, perforation (1:10,000 risk), bleeding, pharyngeal hematoma as well as other potential complications associated with conscious sedation including aspiration, arrhythmia, respiratory failure and death. Alternatives to treatment were discussed, questions were answered. Patient is willing to proceed.   The procedure is scheduled for 03/08/18 at 9:00 with Dr. Anne Fu.   Berton Bon, NP  03/07/2018 4:32 PM

## 2018-03-07 NOTE — Consult Note (Signed)
Physical Medicine and Rehabilitation Consult Reason for Consult: Left-sided weakness Referring Physician: [facility]   HPI: Jesse Price is a 52 y.o. right-handed male with history of hypertension, tobacco abuse, chronic kidney disease stage II, alcohol use.  Per chart review and patient, patient lives with his sister.  Independent prior to admission.  Working full-time.  One level home with 3 steps to entry.  Sister works during the day.  Presented 03/05/2018 with left-sided weakness.  Cranial CT scan showed cytotoxic edema in the right mid frontal lobe extending into the adjacent anterior superior right centrum semi-ovale.  Patient did not receive TPA.  MRI brain reviewed, showing right frontal lobe infarct. Per report, small acute early subacute infarction within the right posterior medial frontal lobe inclusive of precentral gyrus.  No associated hemorrhage.  Multiple small chronic infarcts within the bilateral frontal lobes, right parietal lobe and left posterior temporal lobe.  CT angiogram of head and neck with no dissection or aneurysm.  Echocardiogram with ejection fraction of 60% grade 1 diastolic dysfunction.  Neurology follow-up currently on aspirin for CVA prophylaxis.  Subcutaneous Lovenox for DVT prophylaxis.  Await plan for TEE and loop recorder.  Therapy evaluation completed with recommendations of physical medicine rehab consult.  Review of Systems  Constitutional: Negative for chills and fever.  HENT: Negative for hearing loss.   Eyes: Negative for blurred vision.  Respiratory: Negative for cough and shortness of breath.   Cardiovascular: Negative for chest pain, palpitations and leg swelling.  Gastrointestinal: Positive for constipation. Negative for nausea and vomiting.  Genitourinary: Negative for dysuria, flank pain and hematuria.  Musculoskeletal: Positive for myalgias.  Skin: Negative for rash.  Neurological: Positive for sensory change, speech change and focal  weakness.  All other systems reviewed and are negative.  Past Medical History:  Diagnosis Date  . Asthma   . Hypertension    History reviewed. No pertinent surgical history. Family History  Problem Relation Age of Onset  . Cancer Mother        brain  . Other Father        died in fire  . Hypertension Sister   . Hypertension Brother   . Cancer Brother        back  . Hypertension Sister   . Hypertension Sister   . Heart disease Neg Hx   . Stroke Neg Hx    Social History:  reports that he has been smoking cigarettes. He has been smoking about 0.50 packs per day. He has never used smokeless tobacco. He reports that he drinks about 28.0 standard drinks of alcohol per week. He reports that he does not use drugs. Allergies: No Known Allergies Medications Prior to Admission  Medication Sig Dispense Refill  . amLODipine (NORVASC) 5 MG tablet TAKE 1 TABLET BY MOUTH ONCE DAILY (Patient taking differently: Take 5 mg by mouth daily. ) 30 tablet 0  . ibuprofen (ADVIL,MOTRIN) 800 MG tablet Take 1 tablet (800 mg total) by mouth every 8 (eight) hours as needed for mild pain or moderate pain. (Patient not taking: Reported on 03/05/2018) 15 tablet 0  . mupirocin ointment (BACTROBAN) 2 % Place 1 application into the nose 3 (three) times daily. (Patient not taking: Reported on 03/05/2018) 22 g 0  . rOPINIRole (REQUIP) 0.5 MG tablet Take 1 tablet (0.5 mg total) 3 (three) times daily by mouth. 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)  Functional Status:  Mobility:          ADL:    Cognition: Cognition Overall Cognitive Status: Impaired/Different from baseline Arousal/Alertness: Awake/alert Orientation Level: Oriented X4 Attention: Sustained, Selective Sustained Attention: Impaired Sustained Attention Impairment: Verbal complex Selective Attention: Impaired Selective Attention Impairment: Verbal basic, Functional basic Memory: Impaired Memory Impairment: Storage deficit(delayed recall 0/5, 2/5 with category cue) Awareness: Impaired Awareness Impairment: Intellectual impairment, Emergent impairment, Anticipatory impairment Problem Solving: Impaired Problem Solving Impairment: Verbal basic Executive Function: Organizing, Self Monitoring, Self Correcting Organizing: Impaired Organizing Impairment: Functional basic(clockdrawing impaired 0/3) Self Monitoring: Impaired Self Monitoring Impairment: Verbal basic, Functional basic Self Correcting: Impaired Self Correcting Impairment: Verbal basic, Functional basic Behaviors: Impulsive Safety/Judgment: Impaired Comments: decreased awareness of deficits, impulsivity Cognition Overall Cognitive Status: Impaired/Different from baseline  Blood pressure (!) 160/87, pulse 62, temperature 97.9 F (36.6 C), temperature source Oral, resp. rate 18, height 5\' 6"  (1.676 m), weight 74.8 kg, SpO2 100 %. Physical Exam  Vitals reviewed. Constitutional: He is oriented to person, place, and time. He appears well-developed and well-nourished.  HENT:  Head: Normocephalic and atraumatic.  Eyes: EOM are normal. Right eye exhibits no discharge. Left eye exhibits no discharge.  Neck: Normal range of motion. Neck supple. No thyromegaly present.  Cardiovascular: Normal rate and regular rhythm.  Respiratory: Effort normal and breath sounds  normal. No respiratory distress.  GI: Soft. Bowel sounds are normal. He exhibits no distension.  Musculoskeletal:  No edema or tenderness in extremities  Neurological: He is alert and oriented to person, place, and time.  Follows full commands.   Fair awareness of deficits. Dysarthria Motor: RUE: 4+/5 proximal to distal RLE: 4/5 proximal to distal LUE: 3-/5 proximal to distal LLE: HF, KE 3--3/5, ADF 2/5 Sensation diminished to light touch left hand  Skin: Skin is warm and dry.  Psychiatric: His affect is blunt. His speech is slurred.    Results for orders placed or performed during the hospital encounter of 03/05/18 (from the past 24 hour(s))  CBC     Status: Abnormal   Collection Time: 03/07/18  7:15 AM  Result Value Ref Range   WBC 11.8 (H) 4.0 - 10.5 K/uL   RBC 4.70 4.22 - 5.81 MIL/uL   Hemoglobin 14.4 13.0 - 17.0 g/dL   HCT 16.1 09.6 - 04.5 %   MCV 92.6 80.0 - 100.0 fL   MCH 30.6 26.0 - 34.0 pg   MCHC 33.1 30.0 - 36.0 g/dL   RDW 40.9 81.1 - 91.4 %   Platelets 342 150 - 400 K/uL   nRBC 0.0 0.0 - 0.2 %  Basic metabolic panel     Status: Abnormal   Collection Time: 03/07/18  7:15 AM  Result Value Ref Range   Sodium 138 135 - 145 mmol/L   Potassium 4.2 3.5 - 5.1 mmol/L   Chloride 106 98 - 111 mmol/L   CO2 26 22 - 32 mmol/L   Glucose, Bld 93 70 - 99 mg/dL   BUN 11 6 - 20 mg/dL   Creatinine, Ser 7.82 (H) 0.61 - 1.24 mg/dL   Calcium 9.3 8.9 -  10.3 mg/dL   GFR calc non Af Amer >60 >60 mL/min   GFR calc Af Amer >60 >60 mL/min   Anion gap 6 5 - 15   Ct Angio Head W Or Wo Contrast  Result Date: 03/06/2018 CLINICAL DATA:  52 y/o  M; stroke for follow-up. EXAM: CT ANGIOGRAPHY HEAD AND NECK TECHNIQUE: Multidetector CT imaging of the head and neck was performed using the standard protocol during bolus administration of intravenous contrast. Multiplanar CT image reconstructions and MIPs were obtained to evaluate the vascular anatomy. Carotid stenosis measurements (when  applicable) are obtained utilizing NASCET criteria, using the distal internal carotid diameter as the denominator. CONTRAST:  50mL ISOVUE-370 IOPAMIDOL (ISOVUE-370) INJECTION 76% COMPARISON:  03/05/2018 MRI of the head. FINDINGS: CTA NECK FINDINGS Aortic arch: Bovine variant branching. Imaged portion shows no evidence of aneurysm or dissection. No significant stenosis of the major arch vessel origins. Right carotid system: No evidence of dissection, stenosis (50% or greater) or occlusion. Right carotid bifurcation mixed plaque minimal less than 30% proximal ICA stenosis. Left carotid system: No evidence of dissection, stenosis (50% or greater) or occlusion. Left carotid bifurcation mixed plaque with minimal less than 30% proximal ICA stenosis. Vertebral arteries: Codominant. No evidence of dissection, stenosis (50% or greater) or occlusion. Skeleton: Negative. Other neck: Negative. Upper chest: Negative. Review of the MIP images confirms the above findings CTA HEAD FINDINGS Anterior circulation: No significant stenosis, proximal occlusion, aneurysm, or vascular malformation. Mild non stenotic calcific atherosclerosis of carotid siphons. Posterior circulation: No significant stenosis, proximal occlusion, aneurysm, or vascular malformation. Moderate right P2 stenosis. Venous sinuses: As permitted by contrast timing, patent. Anatomic variants: Anterior communicating artery. No posterior communicating artery identified, likely hypoplastic or absent. Delayed phase: No abnormal intracranial enhancement. Review of the MIP images confirms the above findings IMPRESSION: 1. Patent carotid and vertebral arteries. No dissection, aneurysm, or hemodynamically significant stenosis utilizing NASCET criteria. 2. Patent anterior and posterior intracranial circulation. No large vessel occlusion, aneurysm, or high-grade stenosis. 3. Moderate right P2 PCA stenosis. 4. Non stenotic stenotic atherosclerosis of carotid bifurcations and  carotid siphons. Electronically Signed   By: Mitzi Hansen M.D.   On: 03/06/2018 02:32   Ct Head Wo Contrast  Result Date: 03/05/2018 CLINICAL DATA:  Left upper and lower extremity weakness for approximately 24 hours EXAM: CT HEAD WITHOUT CONTRAST TECHNIQUE: Contiguous axial images were obtained from the base of the skull through the vertex without intravenous contrast. COMPARISON:  None. FINDINGS: Brain: The ventricles are normal in size and duration. There is no demonstrable mass, hemorrhage, extra-axial fluid collection, or midline shift. There is evidence of localized encephalomalacia in the mid right frontal lobe, likely due to prior infarct. Adjacent to this encephalomalacia, there Is apparent cytotoxic edema at the gray-white junction of the right mid frontal lobe, suspicious for recent and probably acute infarct adjacent to an area of encephalomalacia. This edema extends into the anterior superior right centrum semiovale, felt to be part of this recent, likely acute infarct. Elsewhere, the brain parenchyma appears unremarkable. Vascular: No evident hyperdense vessel. There is calcification in each carotid siphon region. Skull: Bony calvarium appears intact. Sinuses/Orbits: There is mucosal thickening in several ethmoid air cells. Other visualized paranasal sinuses are clear. Visualized orbits appear symmetric bilaterally. Other: Mastoid air cells are clear. IMPRESSION: 1. Cytotoxic edema in the mid right frontal lobe extending into the adjacent anterior superior right centrum semiovale. This appearance is felt to represent probable acute infarct immediately adjacent to an older infarct with encephalomalacia  in the periphery of the mid right frontal lobe. No other evident acute infarct. No mass or hemorrhage. Brain parenchyma otherwise appears unremarkable. 2.  There is mucosal thickening in several ethmoid air cells. Electronically Signed   By: Bretta Bang III M.D.   On: 03/05/2018  19:48   Ct Angio Neck W Or Wo Contrast  Result Date: 03/06/2018 CLINICAL DATA:  53 y/o  M; stroke for follow-up. EXAM: CT ANGIOGRAPHY HEAD AND NECK TECHNIQUE: Multidetector CT imaging of the head and neck was performed using the standard protocol during bolus administration of intravenous contrast. Multiplanar CT image reconstructions and MIPs were obtained to evaluate the vascular anatomy. Carotid stenosis measurements (when applicable) are obtained utilizing NASCET criteria, using the distal internal carotid diameter as the denominator. CONTRAST:  50mL ISOVUE-370 IOPAMIDOL (ISOVUE-370) INJECTION 76% COMPARISON:  03/05/2018 MRI of the head. FINDINGS: CTA NECK FINDINGS Aortic arch: Bovine variant branching. Imaged portion shows no evidence of aneurysm or dissection. No significant stenosis of the major arch vessel origins. Right carotid system: No evidence of dissection, stenosis (50% or greater) or occlusion. Right carotid bifurcation mixed plaque minimal less than 30% proximal ICA stenosis. Left carotid system: No evidence of dissection, stenosis (50% or greater) or occlusion. Left carotid bifurcation mixed plaque with minimal less than 30% proximal ICA stenosis. Vertebral arteries: Codominant. No evidence of dissection, stenosis (50% or greater) or occlusion. Skeleton: Negative. Other neck: Negative. Upper chest: Negative. Review of the MIP images confirms the above findings CTA HEAD FINDINGS Anterior circulation: No significant stenosis, proximal occlusion, aneurysm, or vascular malformation. Mild non stenotic calcific atherosclerosis of carotid siphons. Posterior circulation: No significant stenosis, proximal occlusion, aneurysm, or vascular malformation. Moderate right P2 stenosis. Venous sinuses: As permitted by contrast timing, patent. Anatomic variants: Anterior communicating artery. No posterior communicating artery identified, likely hypoplastic or absent. Delayed phase: No abnormal intracranial  enhancement. Review of the MIP images confirms the above findings IMPRESSION: 1. Patent carotid and vertebral arteries. No dissection, aneurysm, or hemodynamically significant stenosis utilizing NASCET criteria. 2. Patent anterior and posterior intracranial circulation. No large vessel occlusion, aneurysm, or high-grade stenosis. 3. Moderate right P2 PCA stenosis. 4. Non stenotic stenotic atherosclerosis of carotid bifurcations and carotid siphons. Electronically Signed   By: Mitzi Hansen M.D.   On: 03/06/2018 02:32   Mr Brain Wo Contrast  Result Date: 03/05/2018 CLINICAL DATA:  52 y/o  M; left-sided weakness. EXAM: MRI HEAD WITHOUT CONTRAST TECHNIQUE: Multiplanar, multiecho pulse sequences of the brain and surrounding structures were obtained without intravenous contrast. COMPARISON:  03/05/2018 CT head. FINDINGS: Brain: Small region of cortical reduced diffusion within the right posteromedial frontal lobe inclusive of precentral gyrus compatible with acute/early subacute infarction (series 5, image 89). No associated hemorrhage or mass effect. No extra-axial collection, hydrocephalus, or herniation. Small chronic cortical infarctions are present within the lateral aspect of frontal lobes bilaterally. And there are very small chronic infarctions within the high right parietal and left posterior temporal cortices. Mild-to-moderate volume loss of the brain for age. No abnormal susceptibility hypointensity to indicate intracranial hemorrhage. Vascular: Normal flow voids. Skull and upper cervical spine: Normal marrow signal. Sinuses/Orbits: Mild ethmoid sinus mucosal thickening. No abnormal signal of mastoid air cells. Orbits are unremarkable Other: None. IMPRESSION: 1. Small acute/early subacute infarction within the right posteromedial frontal lobe inclusive of precentral gyrus. No associated hemorrhage or mass effect. 2. Multiple small chronic infarctions within the bilateral frontal lobes, right  parietal lobe, and left posterior temporal lobe. 3. Mild to moderate volume  loss of the brain for age. These results will be called to the ordering clinician or representative by the Radiologist Assistant, and communication documented in the PACS or zVision Dashboard. Electronically Signed   By: Mitzi Hansen M.D.   On: 03/05/2018 23:43    Assessment/Plan: Diagnosis: Infarction within the right posterior medial frontal lobe  Labs and images (see above) independently reviewed.  Records reviewed and summated above. Stroke: Continue secondary stroke prophylaxis and Risk Factor Modification listed below:   Antiplatelet therapy:   Blood Pressure Management:  Continue current medication with prn's with permisive HTN per primary team Statin Agent:   Tobacco abuse:   Left sided hemiparesis: fit for orthosis to prevent contractures (PRAFO, etc) Motor recovery: Fluoxetine  1. Does the need for close, 24 hr/day medical supervision in concert with the patient's rehab needs make it unreasonable for this patient to be served in a less intensive setting? Yes  2. Co-Morbidities requiring supervision/potential complications: HTN (monitor and provide prns in accordance with increased physical exertion and pain), chronic kidney disease stage II (avoid nephrotoxic meds), alcohol/tobacco abuse (counsel), diastolic dysfunction (monitor for signs/symptoms of fluid overload), leukocytosis (repeat labs, cont to monitor for signs and symptoms of infection, further workup if indicated)   3. Due to safety, disease management and patient education, does the patient require 24 hr/day rehab nursing? Yes 4. Does the patient require coordinated care of a physician, rehab nurse, PT (1-2 hrs/day, 5 days/week) and OT (1-2 hrs/day, 5 days/week) to address physical and functional deficits in the context of the above medical diagnosis(es)? Yes Addressing deficits in the following areas: balance, endurance, locomotion,  strength, transferring, bowel/bladder control, toileting and psychosocial support 5. Can the patient actively participate in an intensive therapy program of at least 3 hrs of therapy per day at least 5 days per week? Yes 6. The potential for patient to make measurable gains while on inpatient rehab is excellent 7. Anticipated functional outcomes upon discharge from inpatient rehab are modified independent and supervision  with PT, modified independent and supervision with OT, n/a with SLP. 8. Estimated rehab length of stay to reach the above functional goals is: 8-11 days. 9. Anticipated D/C setting: Home 10. Anticipated post D/C treatments: HH therapy and Home excercise program 11. Overall Rehab/Functional Prognosis: good  RECOMMENDATIONS: This patient's condition is appropriate for continued rehabilitative care in the following setting: Will await competion of therapy evals.  Likely CIR after completion of medical workup.  Patient has agreed to participate in recommended program. Yes Note that insurance prior authorization may be required for reimbursement for recommended care.  Comment: Rehab Admissions Coordinator to follow up.   I have personally performed a face to face diagnostic evaluation, including, but not limited to relevant history and physical exam findings, of this patient and developed relevant assessment and plan.  Additionally, I have reviewed and concur with the physician assistant's documentation above.   Maryla Morrow, MD, ABPMR [facility] Rossetti Angiulli, PA-C 03/07/2018

## 2018-03-07 NOTE — PMR Pre-admission (Signed)
PMR Admission Coordinator Pre-Admission Assessment  Patient: Jesse Price is an 52 y.o., male MRN: [id number] DOB: Oct 02, 1965 Height: _0  (167.6 cm) Weight: 74.8 kg              Insurance Information HMO:     PPO: Yes     PCP:      IPA:      80/20:      OTHER:  PRIMARY: UMR      Policy#: [id number]      Subscriber: Patient CM Name: Lattie Haw      Phone#: [phone]     Fax#: [phone] Pre-Cert#: [id number]      Employer:  Josem Kaufmann provided by Lattie Haw [phone]) at [facility] in late evening on 03/08/18 for admit date 03/09/18. Pt is approved for 7 days with updates due on 03/16/18.  Benefits:  Phone #: NA     Name: [facility] Provider Portal Eff. Date: 05/11/17     Deduct: $2,400 (met $0)      Out of Pocket Max: $4,400 (Met $0)      Life Max: NA CIR: 70%/30%      SNF: 70%/30% 60 day limit Outpatient: 40/combined PT & OT, 20/ST     Co-Pay: $20/visit Home Health: 70%      Co-Pay: 30% DME: 70%     Co-Pay: 30% Providers:  SECONDARY:       Policy#:       Subscriber:  CM Name:       Phone#:      Fax#:  Pre-Cert#:       Employer:  Benefits:  Phone #:      Name:  Eff. Date:      Deduct:       Out of Pocket Max:       Life Max:  CIR:       SNF:  Outpatient:      Co-Pay:  Home Health:       Co-Pay:  DME:      Co-Pay:  Medicaid Application Date:       Case Manager:  Disability Application Date:       Case Worker:  Emergency Contact Information Contact Information    Name Relation Home Work Mobile   [place] Sister [phone]       Current Medical History  Patient Admitting Diagnosis: Infarction within the right posterior medial frontal lobe History of Present Illness: Jesse Price is a 52 year old right-handed male with history of hypertension, tobacco abuse, CKD stage II and alcohol use.  Per chart review and patient, patient lives with sister.  Independent prior to admission working full-time.  One level home with 3 steps to entry.  Sister works during the day.  Presented 03/05/2018  with left-sided weakness.  Cranial CT scan showed cytotoxic edema in the right mid frontal lobe extending into the adjacent anterior superior right centrum semi-ovale.  Patient did not receive TPA.  MRI of the brain reviewed, showing right frontal lobe infarction.  Per report, small acute early subacute infarction within the right posterior medial frontal lobe.  No associated hemorrhage.  Multiple small chronic infarcts within the bilateral frontal lobes, right parietal lobe and left posterior temporal lobe.  CT angiogram of head and neck with no dissection or aneurysm.  Echocardiogram with ejection fraction of 24% grade 1 diastolic dysfunction.  Neurology follow-up currently on aspirin for CVA prophylaxis.  Subcutaneous Lovenox for DVT prophylaxis.  TEE and loop recorder 03/08/2018.  Therapy evaluations completed with recommendations of physical medicine rehab  consult.  Patient is to be admitted for a comprehensive rehab program on 03/09/18.   Complete NIHSS TOTAL: 4    Past Medical History  Past Medical History:  Diagnosis Date  . Asthma   . Hypertension     Family History  family history includes Cancer in his brother and mother; Hypertension in his brother, sister, sister, and sister; Other in his father.  Prior Rehab/Hospitalizations:  Has the patient had major surgery during 100 days prior to admission? No  Current Medications   Current Facility-Administered Medications:  .  acetaminophen (TYLENOL) tablet 325-650 mg, 325-650 mg, Oral, Q4H PRN, Evans Lance, MD .  aspirin suppository 300 mg, 300 mg, Rectal, Daily **OR** aspirin tablet 325 mg, 325 mg, Oral, Daily, Evans Lance, MD, 325 mg at 03/07/18 1004 .  atorvastatin (LIPITOR) tablet 80 mg, 80 mg, Oral, q1800, Evans Lance, MD, 80 mg at 03/08/18 1825 .  enoxaparin (LOVENOX) injection 40 mg, 40 mg, Subcutaneous, Daily, Evans Lance, MD .  folic acid (FOLVITE) tablet 1 mg, 1 mg, Oral, Daily, Evans Lance, MD, 1 mg at  03/07/18 1004 .  labetalol (NORMODYNE,TRANDATE) injection 5-10 mg, 5-10 mg, Intravenous, Q2H PRN, Evans Lance, MD .  [EXPIRED] LORazepam (ATIVAN) injection 0-4 mg, 0-4 mg, Intravenous, Q6H, 2 mg at 03/06/18 2315 **FOLLOWED BY** LORazepam (ATIVAN) injection 0-4 mg, 0-4 mg, Intravenous, Q12H, Evans Lance, MD .  multivitamin with minerals tablet 1 tablet, 1 tablet, Oral, Daily, Evans Lance, MD, 1 tablet at 03/07/18 1004 .  ondansetron (ZOFRAN) injection 4 mg, 4 mg, Intravenous, Q6H PRN, Evans Lance, MD .  senna-docusate (Senokot-S) tablet 1 tablet, 1 tablet, Oral, QHS PRN, Evans Lance, MD .  thiamine (VITAMIN B-1) tablet 100 mg, 100 mg, Oral, Daily, 100 mg at 03/07/18 1004 **OR** thiamine (B-1) injection 100 mg, 100 mg, Intravenous, Daily, Evans Lance, MD  Patients Current Diet:  Diet Order            Diet Heart Room service appropriate? Yes; Fluid consistency: Thin  Diet effective now              Precautions / Restrictions Precautions Precautions: Fall Restrictions Weight Bearing Restrictions: No   Has the patient had 2 or more falls or a fall with injury in the past year?No  Prior Activity Level Community (5-7x/wk): active, worked full time as Building control surveyor; Administrator, Civil Service; Drove Radiographer, therapeutic / Publishing rights manager: None  Prior Device Use: Indicate devices/aids used by the patient prior to current illness, exacerbation or injury? None of the above  Prior Functional Level Prior Function Level of Independence: Independent Comments: driving and working Management consultant)   Self Care: Did the patient need help bathing, dressing, using the toilet or eating?  Independent  Indoor Mobility: Did the patient need assistance with walking from room to room (with or without device)? Independent  Stairs: Did the patient need assistance with internal or external stairs (with or without device)? Independent  Functional Cognition: Did the patient need  help planning regular tasks such as shopping or remembering to take medications? Independent  Current Functional Level Cognition  Arousal/Alertness: Awake/alert Overall Cognitive Status: Within Functional Limits for tasks assessed Current Attention Level: Sustained Orientation Level: Oriented X4 Following Commands: Follows one step commands consistently, Follows one step commands with increased time Safety/Judgement: Decreased awareness of safety General Comments: continues to show some inattention to left Attention: Sustained, Selective Sustained Attention: Impaired Sustained Attention Impairment:  Verbal complex Selective Attention: Impaired Selective Attention Impairment: Verbal basic, Functional basic Memory: Impaired Memory Impairment: Storage deficit(delayed recall 0/5, 2/5 with category cue) Awareness: Impaired Awareness Impairment: Intellectual impairment, Emergent impairment, Anticipatory impairment Problem Solving: Impaired Problem Solving Impairment: Verbal basic Executive Function: Organizing, Self Monitoring, Self Correcting Organizing: Impaired Organizing Impairment: Functional basic(clockdrawing impaired 0/3) Self Monitoring: Impaired Self Monitoring Impairment: Verbal basic, Functional basic Self Correcting: Impaired Self Correcting Impairment: Verbal basic, Functional basic Behaviors: Impulsive Safety/Judgment: Impaired Comments: decreased awareness of deficits, impulsivity    Extremity Assessment (includes Sensation/Coordination)  Upper Extremity Assessment: Defer to OT evaluation LUE Deficits / Details: Hemiparetic UE: grossly MMT 3-/5 shoulder FF, elbow flexion, finger extension;  decreased functional use and strength, unable to initate grasp consistently LUE Sensation: WNL LUE Coordination: decreased fine motor, decreased gross motor  Lower Extremity Assessment: LLE deficits/detail LLE Deficits / Details: L LE grossly 3-/5 with hip flexion most limited LLE  Sensation: WNL LLE Coordination: decreased fine motor, decreased gross motor    ADLs  Overall ADL's : Needs assistance/impaired Eating/Feeding: NPO Grooming: Set up, Sitting Upper Body Bathing: Minimal assistance, Sitting Lower Body Bathing: Moderate assistance, Sit to/from stand Upper Body Dressing : Moderate assistance, Sitting Lower Body Dressing: Maximal assistance, Sit to/from stand Toilet Transfer: Moderate assistance, Stand-pivot(simulated to recliner ) Functional mobility during ADLs: Moderate assistance General ADL Comments: Pt eager and motivated.  Pt with decreased functional use and ROM of L UE/LE, requires R UE support in standing.     Mobility  Overal bed mobility: Needs Assistance Bed Mobility: Supine to Sit Supine to sit: HOB elevated, Min guard General bed mobility comments: Pt went supine to sit without physical assistance. required increased time and effort. Min guard for safety and stability    Transfers  Overall transfer level: Needs assistance Equipment used: 1 person hand held assist Transfers: Sit to/from Stand Sit to Stand: Min assist Stand pivot transfers: Min guard General transfer comment: Pt able to go from EOB to chair with min guard for safety and stability. 1+ HHA min assist required for power up from sit to stand from chair    Ambulation / Gait / Stairs / Wheelchair Mobility  Ambulation/Gait Ambulation/Gait assistance: Mod assist Gait Distance (Feet): 20 Feet(x 3) Assistive device: 1 person hand held assist(Use of hallway railing for opposite UE) Gait Pattern/deviations: Step-to pattern, Decreased stride length, Decreased stance time - left, Trunk flexed General Gait Details: Pt ambulated in hallway using hand rail on the right for 20 ft x1. Mod assist required for weight shift initiation and stability for first trial. Second and third trial pt utilized 1+ HHA on the right with mod physical assist on the right to promote weight shifting onto LLE. Pt  required verbal cues for increased cadence, weight shifting and to keep posture upright. 20 feet x 3 with rest in chair inbetween.  Gait velocity: decreased Gait velocity interpretation: <1.31 ft/sec, indicative of household ambulator    Posture / Balance Balance Overall balance assessment: Needs assistance Sitting-balance support: No upper extremity supported, Feet supported Sitting balance-Leahy Scale: Fair Standing balance support: Single extremity supported, During functional activity Standing balance-Leahy Scale: Poor Standing balance comment: reliant on RUE support    Special needs/care consideration BiPAP/CPAP: no CPM: no Continuous Drip IV: no Dialysis: no        Days: NA Life Vest: no Oxygen: no Special Bed: no Trach Size: no Wound Vac (area): no      Location: NA Skin: no areas of concern  Bowel mgmt: Last BM: 03/06/18 Bladder mgmt: continent, use of urinal  Diabetic mgmt: no     Previous Home Environment Living Arrangements: Other relatives(sister)  Lives With: Other (Comment)(Sister) Available Help at Discharge: Family, Available PRN/intermittently Type of Home: House Home Layout: One level Home Access: Stairs to enter Entrance Stairs-Rails: None Entrance Stairs-Number of Steps: 3 Bathroom Shower/Tub: Chiropodist: Handicapped height  Discharge Living Setting Plans for Discharge Living Setting: Patient's home, Lives with (comment)(lives with sister, EULA) Type of Home at Discharge: House Discharge Home Layout: One level Discharge Home Access: Stairs to enter Entrance Stairs-Rails: None Entrance Stairs-Number of Steps: 3 Discharge Bathroom Shower/Tub: Tub/shower unit Discharge Bathroom Toilet: Standard Discharge Bathroom Accessibility: Yes How Accessible: Accessible via walker Does the patient have any problems obtaining your medications?: No  Social/Family/Support Systems 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 Information: Gaffer (sister)  Anticipated Caregiver: Melene Muller (sister)  Anticipated Caregiver's Contact Information: cell: [phone] Ability/Limitations of Caregiver: Intermittant supervision (works Games developer, Wed, Thursday 8am-4:30pm Caregiver Availability: Intermittent Discharge Plan Discussed with Primary Caregiver: Yes Is Caregiver In Agreement with Plan?: Yes Does Caregiver/Family have Issues with Lodging/Transportation while Pt is in Rehab?: No   Goals/Additional Needs Patient/Family Goal for Rehab: PT/OT: Mod I/Supervision; SLP: NA Expected length of stay: 8-11 days Cultural Considerations: NA Dietary Needs: Heart Healthy; thin liquids Equipment Needs: TBD Pt/Family Agrees to Admission and willing to participate: Yes Program Orientation Provided & Reviewed with Pt/Caregiver Including Roles  & Responsibilities: Yes(with sister and patient)  Barriers to Discharge: Decreased caregiver support, Home environment access/layout  Barriers to Discharge Comments: 3 steps to enter; tub shower; intermittant supervision   Decrease burden of Care through IP rehab admission: NA   Possible need for SNF placement upon discharge:Not anticipated; Pt has good prognosis for further progress.    Patient Condition: This patient's medical and functional status has changed since the consult dated: 03/07/18 in which the Rehabilitation Physician determined and documented that the patient's condition is appropriate for intensive rehabilitative care in an inpatient rehabilitation facility. Medical changes are: pt underwent TEE and Loop inplantation; Tee did not show any thrombus and no PFO.  Functional changes are: progression in gait training; Pt has remained at [facility] for transfers and progressed from Mod A 15 feet to Mod A 20 feet for ambulation . After evaluating the patient today and speaking with the Rehabilitation physician and acute team, the patient remains appropriate for inpatient  rehab. Will admit to inpatient rehab today.  Preadmission Screen Completed By:  Jhonnie Garner, 03/09/2018 7:32 AM ______________________________________________________________________   Discussed status with Dr. Posey Pronto on 03/09/18 at 9:00AM and received telephone approval for admission today.  Admission Coordinator:  Jhonnie Garner, time 9:00AM/Date 03/09/18

## 2018-03-07 NOTE — Anesthesia Preprocedure Evaluation (Addendum)
Anesthesia Evaluation  Patient identified by MRN, date of birth, ID band Patient awake    Reviewed: Allergy & Precautions, NPO status , Patient's Chart, lab work & pertinent test results  Airway Mallampati: II  TM Distance: >3 FB Neck ROM: Full    Dental no notable dental hx. (+) Teeth Intact, Dental Advisory Given,    Pulmonary asthma , Current Smoker,    Pulmonary exam normal breath sounds clear to auscultation       Cardiovascular hypertension, Pt. on medications Normal cardiovascular exam Rhythm:Regular Rate:Normal  TTE 02/2018 EF 55-60%, no valvular abnormalities   Neuro/Psych CVA (left sided weakness UE>LE), Residual Symptoms negative psych ROS   GI/Hepatic negative GI ROS, (+)     substance abuse  alcohol use,   Endo/Other  negative endocrine ROS  Renal/GU Renal diseaseCKD Stage II  negative genitourinary   Musculoskeletal negative musculoskeletal ROS (+)   Abdominal   Peds  Hematology negative hematology ROS (+)   Anesthesia Other Findings   Reproductive/Obstetrics                            Anesthesia Physical Anesthesia Plan  ASA: III  Anesthesia Plan: MAC   Post-op Pain Management:    Induction: Intravenous  PONV Risk Score and Plan: 0 and Propofol infusion and Treatment may vary due to age or medical condition  Airway Management Planned: Natural Airway and Simple Face Mask  Additional Equipment:   Intra-op Plan:   Post-operative Plan:   Informed Consent: I have reviewed the patients History and Physical, chart, labs and discussed the procedure including the risks, benefits and alternatives for the proposed anesthesia with the patient or authorized representative who has indicated his/her understanding and acceptance.   Dental advisory given  Plan Discussed with: CRNA  Anesthesia Plan Comments:        Anesthesia Quick Evaluation

## 2018-03-07 NOTE — Progress Notes (Signed)
Inpatient Rehabilitation-Admissions Coordinator    Met with patient at the bedside as follow up from PM&R consult. Shared booklets, expectations while in CIR, expected length of stay, and anticipated functional level at DC. Pt wanting to pursue CIR at this time in order to help him get back to work as soon as possible. AC will follow up with pt later today regarding insurance benefits.  Will continue to follow for medical readiness and insurance authorization.   Please call if questions.   Jhonnie Garner, OTR/L  Rehab Admissions Coordinator  [phone] 03/07/2018 2:44 PM

## 2018-03-08 ENCOUNTER — Encounter (HOSPITAL_COMMUNITY)
Admission: EM | Disposition: A | Discharge status: Rehabilitation Facility | Payer: Self-pay | Source: Home / Self Care | Attending: Internal Medicine

## 2018-03-08 ENCOUNTER — Inpatient Hospital Stay (HOSPITAL_COMMUNITY): Payer: Commercial Managed Care - PPO | Admitting: Anesthesiology

## 2018-03-08 ENCOUNTER — Encounter (HOSPITAL_COMMUNITY): Payer: Self-pay | Admitting: *Deleted

## 2018-03-08 ENCOUNTER — Inpatient Hospital Stay (HOSPITAL_COMMUNITY): Payer: Commercial Managed Care - PPO

## 2018-03-08 DIAGNOSIS — I6389 Other cerebral infarction: Secondary | ICD-10-CM

## 2018-03-08 DIAGNOSIS — I340 Nonrheumatic mitral (valve) insufficiency: Secondary | ICD-10-CM

## 2018-03-08 HISTORY — PX: TEE WITHOUT CARDIOVERSION: SHX5443

## 2018-03-08 HISTORY — PX: LOOP RECORDER INSERTION: EP1214

## 2018-03-08 SURGERY — LOOP RECORDER INSERTION

## 2018-03-08 SURGERY — ECHOCARDIOGRAM, TRANSESOPHAGEAL
Anesthesia: Monitor Anesthesia Care

## 2018-03-08 MED ORDER — LIDOCAINE HCL (CARDIAC) PF 100 MG/5ML IV SOSY
PREFILLED_SYRINGE | INTRAVENOUS | Status: DC | PRN
  Administered 2018-03-08: 100 mg via INTRAVENOUS

## 2018-03-08 MED ORDER — LIDOCAINE-EPINEPHRINE 1 %-1:100000 IJ SOLN
INTRAMUSCULAR | Status: DC | PRN
  Administered 2018-03-08: 20 mL

## 2018-03-08 MED ORDER — ONDANSETRON HCL 4 MG/2ML IJ SOLN: 4.0000 mg | Freq: Four times a day (QID) | INTRAMUSCULAR | Status: DC | PRN

## 2018-03-08 MED ORDER — ACETAMINOPHEN 325 MG PO TABS: 325.0000 mg | ORAL_TABLET | ORAL | Status: DC | PRN

## 2018-03-08 MED ORDER — LIDOCAINE-EPINEPHRINE 1 %-1:100000 IJ SOLN
INTRAMUSCULAR | Status: AC
  Filled 2018-03-08: qty 1

## 2018-03-08 MED ORDER — PROPOFOL 10 MG/ML IV BOLUS
INTRAVENOUS | Status: DC | PRN
  Administered 2018-03-08: 20 mg via INTRAVENOUS
  Administered 2018-03-08: 40 mg via INTRAVENOUS
  Administered 2018-03-08: 15 mg via INTRAVENOUS
  Administered 2018-03-08: 40 mg via INTRAVENOUS
  Administered 2018-03-08: 30 mg via INTRAVENOUS

## 2018-03-08 MED ORDER — PROPOFOL 500 MG/50ML IV EMUL
INTRAVENOUS | Status: DC | PRN
  Administered 2018-03-08: 75 ug/kg/min via INTRAVENOUS

## 2018-03-08 SURGICAL SUPPLY — 2 items
LOOP REVEAL LINQSYS (Prosthesis & Implant Heart) ×3 IMPLANT
PACK LOOP INSERTION (CUSTOM PROCEDURE TRAY) ×3 IMPLANT

## 2018-03-08 NOTE — Progress Notes (Signed)
STROKE TEAM PROGRESS NOTE       SUBJECTIVE (INTERVAL HISTORY) His family is not at bedside. He he still has left hemiparesis but it slightly better .was done and was unremarkable. Loop recorder has been inserted.                                              OBJECTIVE Vitals:   03/08/18 1023 03/08/18 1030 03/08/18 1222 03/08/18 1548  BP: 109/88 (!) 145/95 131/86 118/86  Pulse: 71 62 68 71  Resp: 18 13 16 17   Temp:   97.8 F (36.6 C) 97.9 F (36.6 C)  TempSrc:   Oral Oral  SpO2: 100% 100% 100% 99%  Weight:      Height:        CBC:  Recent Labs  Lab 03/05/18 1948 03/05/18 2008 03/07/18 0715  WBC 12.2*  --  11.8*  NEUTROABS 6.6  --   --   HGB 15.9 16.7 14.4  HCT 49.2 49.0 43.5  MCV 94.6  --  92.6  PLT 397  --  342    Basic Metabolic Panel:  Recent Labs  Lab 03/05/18 1948 03/05/18 2008 03/07/18 0715  NA 144 147* 138  K 4.0 4.2 4.2  CL 108 107 106  CO2 27  --  26  GLUCOSE 100* 97 93  BUN 13 16 11   CREATININE 1.33* 1.60* 1.29*  CALCIUM 9.3  --  9.3    Lipid Panel:     Component Value Date/Time   CHOL 172 03/06/2018 0223   TRIG 100 03/06/2018 0223   HDL 77 03/06/2018 0223   CHOLHDL 2.2 03/06/2018 0223   VLDL 20 03/06/2018 0223   LDLCALC 75 03/06/2018 0223   HgbA1c:  Lab Results  Component Value Date   HGBA1C 5.6 03/06/2018   Urine Drug Screen: No results found for: LABOPIA, COCAINSCRNUR, LABBENZ, AMPHETMU, THCU, LABBARB  Alcohol Level No results found for: ETH  IMAGING  Ct Angio Head W Or Wo Contrast Ct Angio Neck W Or Wo Contrast 03/06/2018 IMPRESSION:  1. Patent carotid and vertebral arteries. No dissection, aneurysm, or hemodynamically significant stenosis utilizing NASCET criteria.  2. Patent anterior and posterior intracranial circulation. No large vessel occlusion, aneurysm, or high-grade stenosis.  3. Moderate right P2 PCA stenosis.  4. Non stenotic stenotic atherosclerosis of carotid bifurcations and carotid siphons.     Ct Head Wo Contrast 03/05/2018 IMPRESSION:  1. Cytotoxic edema in the mid right frontal lobe extending into the adjacent anterior superior right centrum semiovale. This appearance is felt to represent probable acute infarct immediately adjacent to an older infarct with encephalomalacia in the periphery of the mid right frontal lobe. No other evident acute infarct. No mass or hemorrhage. Brain parenchyma otherwise appears unremarkable.  2.  There is mucosal thickening in several ethmoid air cells.    Mr Brain Wo Contrast 03/05/2018 IMPRESSION:  1. Small acute/early subacute infarction within the right posteromedial frontal lobe inclusive of precentral gyrus. No associated hemorrhage or mass effect.  2. Multiple small chronic infarctions within the bilateral frontal lobes, right parietal lobe, and left posterior temporal lobe.  3. Mild to moderate volume loss of the brain for age.     Transthoracic Echocardiogram  03/06/2018 Study Conclusions - Left ventricle: The cavity size was normal. Wall thickness was   normal. Systolic function was normal. The estimated ejection  fraction was in the range of 55% to 60%. Wall motion was normal;   there were no regional wall motion abnormalities. Doppler   parameters are consistent with abnormal left ventricular   relaxation (grade 1 diastolic dysfunction).    PHYSICAL EXAM Blood pressure 118/86, pulse 71, temperature 97.9 F (36.6 C), temperature source Oral, resp. rate 17, height 5\' 6"  (1.676 m), weight 74.8 kg, SpO2 99 %.   PHYSICAL EXAM Physical exam: Exam: Gen: NAD Eyes: anicteric sclerae, moist conjunctivae                    CV: no MRG, no carotid bruits, no peripheral edema Mental Status: Alert, follows commands, good historian  Neuro: Detailed Neurologic Exam  Speech:    No aphasia, no dysarthria. Naming and repetition intact.  Cranial Nerves:    The pupils are equal, round, and reactive to light.. Attempted, Fundi  not visualized.  EOMI.  Visual fields full. Left lower facial mild weakness, Tongue midline, uvula midline, Hearing intact to voice. Shoulder shrug intact  Motor Observation:    no involuntary movements noted. Tone appears normal.     Strength:  Left arm 3-/5, able to lift off the bed with drift, significantweakness of left hand grip. Lower extremity is 4/5. Right is 5/5.   Sensation: Decreased left arm >> left leg  Coordination: No dysmetria with right limbs. Unable on the left due to weakness. ASSESSMENT/PLAN [name] is a 51 y.o. male with history of previous strokes by imaging, tobacco use and hypertension, not on any antihypertensives at this time  presenting with sudden onset of left arm and leg weakness. He did not receive IV t-PA due to late presentation.  Stroke: Rt frontal lobe infarct - embolic given multiple old strokes in multiple distributions.  Resultant  Left hemiparesis, mild left NS flattening  CT head - Cytotoxic edema in the mid right frontal lobe extending into the adjacent anterior superior right centrum semiovale. This appearance is felt to represent probable acute infarct immediately adjacent to an older infarct with encephalomalacia in the periphery of the mid right frontal lobe  MRI head - Small acute/early subacute infarction within the right posteromedial frontal lobe inclusive of precentral gyrus. Multiple old infarcts.  Will need TEE and loop  MRA head - not performed  CTA H&N - no high grade stenosis  Carotid Doppler - CTA neck performed or pending - carotid dopplers not indicated.  2D Echo  - EF 55 - 60%. No cardiac source of emboli identified.   LDL - 75  HgbA1c - 5.6  VTE prophylaxis - Lovenox  Diet - Heart healthy with thin liquids.  No antithrombotic prior to admission, now on aspirin 325 mg daily  Patient counseled to be compliant with his antithrombotic medications  Ongoing aggressive stroke risk factor management  Therapy  recommendations:  Inpatient rehabilitation  Disposition:  Pending  Hypertension  Stable . Permissive hypertension (OK if < 220/120) but gradually normalize in 5-7 days . Long-term BP goal normotensive  Hyperlipidemia  Lipid lowering medication PTA:  none  LDL 75, goal < 70  Current lipid lowering medication: Now on Lipitor 80 mg daily.   Continue statin at discharge   Other Stroke Risk Factors  Cigarette smoker - advised to stop smoking  ETOH use, advised to drink no more than 1 alcoholic beverage per day.  Previous strokes by imaging  Other Active Problems  Mild leukocytosis  Elevated creatinine 1.6  Plan Await transfer to inpatient  rehabilitation over the next few days when bed available. Follow-up as an outpatient in stroke clinic in 6 weeks. Stroke team will sign off. Kindly call for questions.  Hospital day # 2    Delia Heady, MD Stroke Neurology   To contact Stroke Continuity provider, please refer to [email or web address]. After hours, contact General Neurology

## 2018-03-08 NOTE — Progress Notes (Signed)
PROGRESS NOTE    Jesse Price  WUJ:[id number] DOB: 03-25-1966 DOA: 03/05/2018 PCP: Jac Canavan, PA-C    Brief Narrative:   Jesse Price is a left-handed 52 y.o. male with medical history significant for hypertension, tobacco abuse, chronic kidney disease stage II, and excessive daily alcohol use, now presenting to the emergency department for evaluation of weakness involving his left arm and leg.  The patient reports that he was in his usual state of health until the evening of 03/04/2018 when he developed acute onset of weakness involving the left arm and leg. Neurology was consulted by the ED physician and recommends a medical admission for further evaluation of acute ischemic CVA.  Assessment & Plan:   Principal Problem:   Acute ischemic stroke [facility]) Active Problems:   Essential hypertension   CKD (chronic kidney disease), stage II   Alcohol dependence (HCC)   Cerebrovascular accident (CVA) (HCC)   Left-sided weakness   Benign essential HTN   Tobacco abuse   Diastolic dysfunction   Leukocytosis   Right-sided frontal lobe infarct possibly embolic as he has multiple old infarcts. And continues to have left hemiparesis, but improving  Follow-up with CT angiogram of the head and neck.  MRI of the head showed acute/subacute infarction in the right posterior medial frontal lobe with multiple old infarcts.  Echocardiogram ordered and showed  cavity size was normal. Wall thickness was   normal. Systolic function was normal. The estimated ejection   fraction was in the range of 55% to 60%. Wall motion was normal;   there were no regional wall motion abnormalities. Doppler   parameters are consistent with abnormal left ventricular relaxation, grade 1 diastolic dysfunction.  LDL is 75 resume Lipitor Hemoglobin A1c 5.6 continue with SSI. Neurology consulted and recommendations given. Aspirin 325 mg daily Therapy evaluations are recommending CIR Loop recorder placed. TEE  did not show any thrombus and no PFO,  Plan for inpatient rehab in am after insurance auth.      Hyperlipidemia LDL is 75, currently on Lipitor 80 mg.   Hypertension permissive hypertension (OK if < 220/120)   EtOH abuse Currently on CIWA protocol, watch for withdrawal symptoms.   Acute on stage II CKD creatinine 1.6 Hydrate with normal saline and repeat renal para meters in the morning show much improvement.  Repeat renal parameters in am.   Mild leukocytosis No signs of infection repeat CBC in the morning show improvement.   DVT prophylaxis: Lovenox Code Status: (Full code Family Communication: family at bedside Disposition Plan: Pending further evaluation  Consultants:   Neurology   Procedures: CT angio of the head and neck.   Echocardiogram.    Antimicrobials: none.    Subjective: In good spirits. No chest pai or sob.  No nausea or vomiting.  Left arm weakness improving.  Objective: Vitals:   03/08/18 1013 03/08/18 1023 03/08/18 1030 03/08/18 1222  BP: 133/89 109/88 (!) 145/95 131/86  Pulse:  71 62 68  Resp: 11 18 13 16   Temp: 98.9 F (37.2 C)   97.8 F (36.6 C)  TempSrc: Oral   Oral  SpO2: 100% 100% 100% 100%  Weight:      Height:        Intake/Output Summary (Last 24 hours) at 03/08/2018 1541 Last data filed at 03/08/2018 1017 Gross per 24 hour  Intake 300.7 ml  Output 1470 ml  Net -1169.3 ml   Filed Weights   03/05/18 1916  Weight: 74.8 kg    Examination:  General exam: Appears calm and comfortable not in any distress.  Respiratory system: good air entry bilateral.  Cardiovascular system: S1 & S2 heard, RRR. No JVD, murmurs. No pedal edema. Gastrointestinal system: Abdomen is soft NT ND BS+ Central nervous system: Alert and oriented. Left arm weakness improving, able to make a grip. Leg weakness improving.  Extremities: no cyanosis or clubbing.  Skin: No rashes, lesions or ulcers Psychiatry:  Mood & affect appropriate.      Data Reviewed: I have personally reviewed following labs and imaging studies  CBC: Recent Labs  Lab 03/05/18 1948 03/05/18 2008 03/07/18 0715  WBC 12.2*  --  11.8*  NEUTROABS 6.6  --   --   HGB 15.9 16.7 14.4  HCT 49.2 49.0 43.5  MCV 94.6  --  92.6  PLT 397  --  342   Basic Metabolic Panel: Recent Labs  Lab 03/05/18 1948 03/05/18 2008 03/07/18 0715  NA 144 147* 138  K 4.0 4.2 4.2  CL 108 107 106  CO2 27  --  26  GLUCOSE 100* 97 93  BUN 13 16 11   CREATININE 1.33* 1.60* 1.29*  CALCIUM 9.3  --  9.3   GFR: Estimated Creatinine Clearance: 59.8 mL/min (A) (by C-G formula based on SCr of 1.29 mg/dL (H)). Liver Function Tests: Recent Labs  Lab 03/05/18 1948  AST 32  ALT 19  ALKPHOS 57  BILITOT 0.6  PROT 7.6  ALBUMIN 4.7   No results for input(s): LIPASE, AMYLASE in the last 168 hours. No results for input(s): AMMONIA in the last 168 hours. Coagulation Profile: Recent Labs  Lab 03/05/18 1948  INR 0.97   Cardiac Enzymes: No results for input(s): CKTOTAL, CKMB, CKMBINDEX, TROPONINI in the last 168 hours. BNP (last 3 results) No results for input(s): PROBNP in the last 8760 hours. HbA1C: Recent Labs    03/06/18 0223  HGBA1C 5.6   CBG: No results for input(s): GLUCAP in the last 168 hours. Lipid Profile: Recent Labs    03/06/18 0223  CHOL 172  HDL 77  LDLCALC 75  TRIG 100  CHOLHDL 2.2   Thyroid Function Tests: No results for input(s): TSH, T4TOTAL, FREET4, T3FREE, THYROIDAB in the last 72 hours. Anemia Panel: No results for input(s): VITAMINB12, FOLATE, FERRITIN, TIBC, IRON, RETICCTPCT in the last 72 hours. Sepsis Labs: No results for input(s): PROCALCITON, LATICACIDVEN in the last 168 hours.  No results found for this or any previous visit (from the past 240 hour(s)).       Radiology Studies: No results found.      Scheduled Meds: . aspirin  300 mg Rectal Daily   Or  . aspirin  325 mg Oral Daily  . atorvastatin  80 mg Oral  q1800  . enoxaparin (LOVENOX) injection  40 mg Subcutaneous Daily  . folic acid  1 mg Oral Daily  . LORazepam  0-4 mg Intravenous Q12H  . multivitamin with minerals  1 tablet Oral Daily  . thiamine  100 mg Oral Daily   Or  . thiamine  100 mg Intravenous Daily   Continuous Infusions:   LOS: 2 days    Time spent: 25 min    Kathlen Mody, MD [facility] Pager [phone]  If 7PM-7AM, please contact night-coverage [email or web address] Password [email or web address] 03/08/2018, 3:41 PM

## 2018-03-08 NOTE — Discharge Instructions (Signed)
Post implant site/wound care °Keep incision clean and dry for 3 days. °You can remove outer dressing tomorrow. °Leave steri-strips (little pieces of tape) on until seen in the office for wound check appointment. °Call the office ([phone]) for redness, drainage, swelling, or fever. ° °

## 2018-03-08 NOTE — Anesthesia Postprocedure Evaluation (Signed)
Anesthesia Post Note  Patient: Jesse Price  Procedure(s) Performed: TRANSESOPHAGEAL ECHOCARDIOGRAM (TEE) (N/A ) BUBBLE STUDY     Patient location during evaluation: PACU Anesthesia Type: MAC Level of consciousness: awake and alert Pain management: pain level controlled Vital Signs Assessment: post-procedure vital signs reviewed and stable Respiratory status: spontaneous breathing, nonlabored ventilation, respiratory function stable and patient connected to nasal cannula oxygen Cardiovascular status: stable and blood pressure returned to baseline Postop Assessment: no apparent nausea or vomiting Anesthetic complications: no    Last Vitals:  Vitals:   03/08/18 1222 03/08/18 1548  BP: 131/86 118/86  Pulse: 68 71  Resp: 16 17  Temp: 36.6 C 36.6 C  SpO2: 100% 99%    Last Pain:  Vitals:   03/08/18 1548  TempSrc: Oral  PainSc:                  Chelsey L Woodrum

## 2018-03-08 NOTE — Progress Notes (Signed)
  Echocardiogram Echocardiogram Transesophageal has been performed.  [name] 03/08/2018, 10:28 AM

## 2018-03-08 NOTE — Interval H&P Note (Signed)
History and Physical Interval Note:  03/08/2018 8:17 AM  Jesse Price  has presented today for surgery, with the diagnosis of stroke  The various methods of treatment have been discussed with the patient and family. After consideration of risks, benefits and other options for treatment, the patient has consented to  Procedure(s): TRANSESOPHAGEAL ECHOCARDIOGRAM (TEE) (N/A) as a surgical intervention .  The patient's history has been reviewed, patient examined, no change in status, stable for surgery.  I have reviewed the patient's chart and labs.  Questions were answered to the patient's satisfaction.     [employer]

## 2018-03-08 NOTE — Progress Notes (Signed)
Physical Therapy Treatment Patient Details Name: Jesse Price MRN: [id number] DOB: [birth year] Today's Date: 03/08/2018    History of Present Illness Pt is a 52 y.o. male past medical history of hypertension, not on any antihypertensives at this time, was in usual state of health yesterday and the last known normal around 5:30 PM on 03/04/2018 when he had sudden onset of left arm and leg weakness.  MRI reveals: Small acute/early subacute infarction within the right posteromedial frontal lobe inclusive of precentral gyrus. Multiple old infarcts.    PT Comments    Pt agreeable to gait training today. Pt continues to require physical assist and verbal cues for mobility and ambulation. Pt progressing towards goals. Current plan of care remains appropriate.    Follow Up Recommendations  CIR     Equipment Recommendations  Other (comment)(defer)    Recommendations for Other Services       Precautions / Restrictions Precautions Precautions: Fall Restrictions Weight Bearing Restrictions: No    Mobility  Bed Mobility Overal bed mobility: Needs Assistance Bed Mobility: Supine to Sit     Supine to sit: HOB elevated;Min guard     General bed mobility comments: Pt went supine to sit without physical assistance. required increased time and effort. Min guard for safety and stability  Transfers Overall transfer level: Needs assistance Equipment used: 1 person hand held assist Transfers: Sit to/from Stand Sit to Stand: Min assist Stand pivot transfers: Min guard       General transfer comment: Pt able to go from EOB to chair with min guard for safety and stability. 1+ HHA min assist required for power up from sit to stand from chair  Ambulation/Gait Ambulation/Gait assistance: Mod assist Gait Distance (Feet): 20 Feet(x 3) Assistive device: 1 person hand held assist(Use of hallway railing for opposite UE) Gait Pattern/deviations: Step-to pattern;Decreased stride length;Decreased  stance time - left;Trunk flexed Gait velocity: decreased Gait velocity interpretation: <1.31 ft/sec, indicative of household ambulator General Gait Details: Pt ambulated in hallway using hand rail on the right for 20 ft x1. Mod assist required for weight shift initiation and stability for first trial. Second and third trial pt utilized 1+ HHA on the right with mod physical assist on the right to promote weight shifting onto LLE. Pt required verbal cues for increased cadence, weight shifting and to keep posture upright. 20 feet x 3 with rest in chair inbetween.    Stairs             Wheelchair Mobility    Modified Rankin (Stroke Patients Only) Modified Rankin (Stroke Patients Only) Pre-Morbid Rankin Score: No symptoms Modified Rankin: Moderately severe disability     Balance Overall balance assessment: Needs assistance Sitting-balance support: No upper extremity supported;Feet supported Sitting balance-Leahy Scale: Fair     Standing balance support: Single extremity supported;During functional activity Standing balance-Leahy Scale: Poor Standing balance comment: reliant on RUE support                            Cognition Arousal/Alertness: Awake/alert Behavior During Therapy: WFL for tasks assessed/performed Overall Cognitive Status: Within Functional Limits for tasks assessed                     Current Attention Level: Sustained   Following Commands: Follows one step commands consistently;Follows one step commands with increased time   Awareness: Emergent Problem Solving: Slow processing;Decreased initiation;Difficulty sequencing;Requires verbal cues General Comments: continues to show some inattention  to left      Exercises      General Comments General comments (skin integrity, edema, etc.): Pt stated concerns about increasing weight bearing on LLE do to feeling "wobbly." pt able to increase weight shift to the left and increased cadence with  verbal cueing. 2nd PT present for management of chair.       Pertinent Vitals/Pain Pain Assessment: No/denies pain    Home Living                      Prior Function            PT Goals (current goals can now be found in the care plan section) Acute Rehab PT Goals Patient Stated Goal: to use my left arm  PT Goal Formulation: With patient Time For Goal Achievement: 03/21/18 Potential to Achieve Goals: Good Progress towards PT goals: Progressing toward goals    Frequency    Min 4X/week      PT Plan Current plan remains appropriate    Co-evaluation              AM-PAC PT "6 Clicks" Daily Activity  Outcome Measure  Difficulty turning over in bed (including adjusting bedclothes, sheets and blankets)?: A Little Difficulty moving from lying on back to sitting on the side of the bed? : A Little Difficulty sitting down on and standing up from a chair with arms (e.g., wheelchair, bedside commode, etc,.)?: A Little Help needed moving to and from a bed to chair (including a wheelchair)?: A Little Help needed walking in hospital room?: A Lot Help needed climbing 3-5 steps with a railing? : A Lot 6 Click Score: 16    End of Session Equipment Utilized During Treatment: Gait belt Activity Tolerance: Patient tolerated treatment well Patient left: in chair;with call bell/phone within reach;with chair alarm set Nurse Communication: Mobility status(100cc urine out put) PT Visit Diagnosis: Unsteadiness on feet (R26.81);Other abnormalities of gait and mobility (R26.89);Muscle weakness (generalized) (M62.81)     Time: 1434-1500 PT Time Calculation (min) (ACUTE ONLY): 26 min  Charges:  $Gait Training: 23-37 mins                    Denman George, SPT   Denman George 03/08/2018, 3:46 PM

## 2018-03-08 NOTE — Consult Note (Addendum)
ELECTROPHYSIOLOGY CONSULT NOTE  Patient ID: Jesse Price MRN: [id number], DOB/AGE: 08/14/1965   Admit date: 03/05/2018 Date of Consult: 03/08/2018  Primary Physician: Jac Canavan, PA-C Primary Cardiologist: none Reason for Consultation: Cryptogenic stroke ; recommendations regarding Implantable Loop Recorder, requested by Dr. Pearlean Brownie  History of Present Illness Jesse Price was admitted on 03/05/2018 with acute stroke, left hemiparesis.  PMHx noted for HTN, tobacco and ETOH abuse, CKD (II).  They first developed symptoms while at home.  Imaging demonstrated Rt frontal lobe infarct - embolic given multiple old strokes in multiple distributions..  he has undergone workup for stroke including echocardiogram and carotid angio.  The patient has been monitored on telemetry which has demonstrated sinus rhythm with no arrhythmias.  Inpatient stroke work-up is to be completed with a TEE.   Echocardiogram this admission demonstrated  Study Conclusions - Left ventricle: The cavity size was normal. Wall thickness was   normal. Systolic function was normal. The estimated ejection   fraction was in the range of 55% to 60%. Wall motion was normal;   there were no regional wall motion abnormalities. Doppler   parameters are consistent with abnormal left ventricular   relaxation (grade 1 diastolic dysfunction).   Lab work is reviewed.  Prior to admission, the patient denies chest pain, shortness of breath, dizziness, infrequent brief palpitations, no near syncope or syncope.  He remains with L sided hemiparesis, with plans to CIR at discharge.   Past Medical History:  Diagnosis Date  . Asthma   . Hypertension      Surgical History: History reviewed. No pertinent surgical history.   Medications Prior to Admission  Medication Sig Dispense Refill Last Dose  . amLODipine (NORVASC) 5 MG tablet TAKE 1 TABLET BY MOUTH ONCE DAILY (Patient taking differently: Take 5 mg by mouth daily. ) 30  tablet 0 5 months ago  . ibuprofen (ADVIL,MOTRIN) 800 MG tablet Take 1 tablet (800 mg total) by mouth every 8 (eight) hours as needed for mild pain or moderate pain. (Patient not taking: Reported on 03/05/2018) 15 tablet 0 Not Taking at Unknown time  . mupirocin ointment (BACTROBAN) 2 % Place 1 application into the nose 3 (three) times daily. (Patient not taking: Reported on 03/05/2018) 22 g 0 Not Taking at Unknown time  . rOPINIRole (REQUIP) 0.5 MG tablet Take 1 tablet (0.5 mg total) 3 (three) times daily by mouth. (Patient not taking: Reported on 03/05/2018) 30 tablet 1 Not Taking at Unknown time    Inpatient Medications:  . aspirin  300 mg Rectal Daily   Or  . aspirin  325 mg Oral Daily  . atorvastatin  80 mg Oral q1800  . enoxaparin (LOVENOX) injection  40 mg Subcutaneous Daily  . folic acid  1 mg Oral Daily  . LORazepam  0-4 mg Intravenous Q12H  . multivitamin with minerals  1 tablet Oral Daily  . thiamine  100 mg Oral Daily   Or  . thiamine  100 mg Intravenous Daily    Allergies: No Known Allergies  Social History   Socioeconomic History  . Marital status: Divorced    Spouse name: Not on file  . Number of children: Not on file  . Years of education: Not on file  . Highest education level: Not on file  Occupational History  . Not on file  Social Needs  . Financial resource strain: Not on file  . Food insecurity:    Worry: Not on file    Inability: Not  on file  . Transportation needs:    Medical: Not on file    Non-medical: Not on file  Tobacco Use  . Smoking status: Current Every Day Smoker    Packs/day: 0.50    Types: Cigarettes  . Smokeless tobacco: Never Used  Substance and Sexual Activity  . Alcohol use: Yes    Alcohol/week: 28.0 standard drinks    Types: 28 Cans of beer per week  . Drug use: No  . Sexual activity: Not on file  Lifestyle  . Physical activity:    Days per week: Not on file    Minutes per session: Not on file  . Stress: Not on file    Relationships  . Social connections:    Talks on phone: Not on file    Gets together: Not on file    Attends religious service: Not on file    Active member of club or organization: Not on file    Attends meetings of clubs or organizations: Not on file    Relationship status: Not on file  . Intimate partner violence:    Fear of current or ex partner: Not on file    Emotionally abused: Not on file    Physically abused: Not on file    Forced sexual activity: Not on file  Other Topics Concern  . Not on file  Social History Narrative  . Not on file     Family History  Problem Relation Age of Onset  . Cancer Mother        brain  . Other Father        died in fire  . Hypertension Sister   . Hypertension Brother   . Cancer Brother        back  . Hypertension Sister   . Hypertension Sister   . Heart disease Neg Hx   . Stroke Neg Hx       Review of Systems: All other systems reviewed and are otherwise negative except as noted above.  Physical Exam: Vitals:   03/07/18 2007 03/07/18 2141 03/07/18 2343 03/08/18 0413  BP: 139/85  136/80 (!) 139/93  Pulse: 68  72 60  Resp: 18  18 18   Temp: 98.9 F (37.2 C)  98.8 F (37.1 C) 98.3 F (36.8 C)  TempSrc: Oral  Oral Oral  SpO2: 100% 100% 99% 100%  Weight:      Height:        GEN- The patient is well appearing, alert and oriented x 3 today.   Head- normocephalic, atraumatic Eyes-  Sclera clear, conjunctiva pink Ears- hearing intact Oropharynx- clear Neck- supple Lungs- CTA b/l, normal work of breathing Heart- RRR, no murmurs, rubs or gallops  GI- soft, NT, ND Extremities- no clubbing, cyanosis, or edema MS- no significant deformity or atrophy Skin- no rash or lesion Psych- euthymic mood, full affect   Labs:   Lab Results  Component Value Date   WBC 11.8 (H) 03/07/2018   HGB 14.4 03/07/2018   HCT 43.5 03/07/2018   MCV 92.6 03/07/2018   PLT 342 03/07/2018    Recent Labs  Lab 03/05/18 1948  03/07/18 0715   NA 144   < > 138  K 4.0   < > 4.2  CL 108   < > 106  CO2 27  --  26  BUN 13   < > 11  CREATININE 1.33*   < > 1.29*  CALCIUM 9.3  --  9.3  PROT 7.6  --   --  BILITOT 0.6  --   --   ALKPHOS 57  --   --   ALT 19  --   --   AST 32  --   --   GLUCOSE 100*   < > 93   < > = values in this interval not displayed.   No results found for: CKTOTAL, CKMB, CKMBINDEX, TROPONINI Lab Results  Component Value Date   CHOL 172 03/06/2018   Lab Results  Component Value Date   HDL 77 03/06/2018   Lab Results  Component Value Date   LDLCALC 75 03/06/2018   Lab Results  Component Value Date   TRIG 100 03/06/2018   Lab Results  Component Value Date   CHOLHDL 2.2 03/06/2018   No results found for: LDLDIRECT  No results found for: DDIMER   Radiology/Studies:   Ct Angio Head W Or Wo Contrast Result Date: 03/06/2018 CLINICAL DATA:  52 y/o  M; stroke for follow-up. EXAM: CT ANGIOGRAPHY HEAD AND NECK TECHNIQUE: Multidetector CT imaging of the head and neck was performed using the standard protocol during bolus administration of intravenous contrast. Multiplanar CT image reconstructions and MIPs were obtained to evaluate the vascular anatomy. Carotid stenosis measurements (when applicable) are obtained utilizing NASCET criteria, using the distal internal carotid diameter as the denominator. CONTRAST:  50mL ISOVUE-370 IOPAMIDOL (ISOVUE-370) INJECTION 76% COMPARISON:  03/05/2018 MRI of the head. FINDINGS: CTA NECK FINDINGS Aortic arch: Bovine variant branching. Imaged portion shows no evidence of aneurysm or dissection. No significant stenosis of the major arch vessel origins. Right carotid system: No evidence of dissection, stenosis (50% or greater) or occlusion. Right carotid bifurcation mixed plaque minimal less than 30% proximal ICA stenosis. Left carotid system: No evidence of dissection, stenosis (50% or greater) or occlusion. Left carotid bifurcation mixed plaque with minimal less than 30%  proximal ICA stenosis. Vertebral arteries: Codominant. No evidence of dissection, stenosis (50% or greater) or occlusion. Skeleton: Negative. Other neck: Negative. Upper chest: Negative. Review of the MIP images confirms the above findings CTA HEAD FINDINGS Anterior circulation: No significant stenosis, proximal occlusion, aneurysm, or vascular malformation. Mild non stenotic calcific atherosclerosis of carotid siphons. Posterior circulation: No significant stenosis, proximal occlusion, aneurysm, or vascular malformation. Moderate right P2 stenosis. Venous sinuses: As permitted by contrast timing, patent. Anatomic variants: Anterior communicating artery. No posterior communicating artery identified, likely hypoplastic or absent. Delayed phase: No abnormal intracranial enhancement. Review of the MIP images confirms the above findings IMPRESSION: 1. Patent carotid and vertebral arteries. No dissection, aneurysm, or hemodynamically significant stenosis utilizing NASCET criteria. 2. Patent anterior and posterior intracranial circulation. No large vessel occlusion, aneurysm, or high-grade stenosis. 3. Moderate right P2 PCA stenosis. 4. Non stenotic stenotic atherosclerosis of carotid bifurcations and carotid siphons. Electronically Signed   By: Mitzi Hansen M.D.   On: 03/06/2018 02:32     Ct Head Wo Contrast Result Date: 03/05/2018 CLINICAL DATA:  Left upper and lower extremity weakness for approximately 24 hours EXAM: CT HEAD WITHOUT CONTRAST TECHNIQUE: Contiguous axial images were obtained from the base of the skull through the vertex without intravenous contrast. COMPARISON:  None. FINDINGS: Brain: The ventricles are normal in size and duration. There is no demonstrable mass, hemorrhage, extra-axial fluid collection, or midline shift. There is evidence of localized encephalomalacia in the mid right frontal lobe, likely due to prior infarct. Adjacent to this encephalomalacia, there Is apparent  cytotoxic edema at the gray-white junction of the right mid frontal lobe, suspicious for recent and probably acute  infarct adjacent to an area of encephalomalacia. This edema extends into the anterior superior right centrum semiovale, felt to be part of this recent, likely acute infarct. Elsewhere, the brain parenchyma appears unremarkable. Vascular: No evident hyperdense vessel. There is calcification in each carotid siphon region. Skull: Bony calvarium appears intact. Sinuses/Orbits: There is mucosal thickening in several ethmoid air cells. Other visualized paranasal sinuses are clear. Visualized orbits appear symmetric bilaterally. Other: Mastoid air cells are clear. IMPRESSION: 1. Cytotoxic edema in the mid right frontal lobe extending into the adjacent anterior superior right centrum semiovale. This appearance is felt to represent probable acute infarct immediately adjacent to an older infarct with encephalomalacia in the periphery of the mid right frontal lobe. No other evident acute infarct. No mass or hemorrhage. Brain parenchyma otherwise appears unremarkable. 2.  There is mucosal thickening in several ethmoid air cells. Electronically Signed   By: Bretta Bang III M.D.   On: 03/05/2018 19:48     Mr Brain Wo Contrast Result Date: 03/05/2018 CLINICAL DATA:  52 y/o  M; left-sided weakness. EXAM: MRI HEAD WITHOUT CONTRAST TECHNIQUE: Multiplanar, multiecho pulse sequences of the brain and surrounding structures were obtained without intravenous contrast. COMPARISON:  03/05/2018 CT head. FINDINGS: Brain: Small region of cortical reduced diffusion within the right posteromedial frontal lobe inclusive of precentral gyrus compatible with acute/early subacute infarction (series 5, image 89). No associated hemorrhage or mass effect. No extra-axial collection, hydrocephalus, or herniation. Small chronic cortical infarctions are present within the lateral aspect of frontal lobes bilaterally. And there are  very small chronic infarctions within the high right parietal and left posterior temporal cortices. Mild-to-moderate volume loss of the brain for age. No abnormal susceptibility hypointensity to indicate intracranial hemorrhage. Vascular: Normal flow voids. Skull and upper cervical spine: Normal marrow signal. Sinuses/Orbits: Mild ethmoid sinus mucosal thickening. No abnormal signal of mastoid air cells. Orbits are unremarkable Other: None. IMPRESSION: 1. Small acute/early subacute infarction within the right posteromedial frontal lobe inclusive of precentral gyrus. No associated hemorrhage or mass effect. 2. Multiple small chronic infarctions within the bilateral frontal lobes, right parietal lobe, and left posterior temporal lobe. 3. Mild to moderate volume loss of the brain for age. These results will be called to the ordering clinician or representative by the Radiologist Assistant, and communication documented in the PACS or zVision Dashboard. Electronically Signed   By: Mitzi Hansen M.D.   On: 03/05/2018 23:43    12-lead ECG SR All prior EKG's in EPIC reviewed with no documented atrial fibrillation  Telemetry SR  Assessment and Plan:  1. Cryptogenic stroke The patient presents with cryptogenic stroke.  The patient has a TEE planned for this AM.  I spoke at length with the patient about monitoring for afib with either a 30 day event monitor or an implantable loop recorder.  Risks, benefits, and alteratives to implantable loop recorder were discussed with the patient today.   At this time, the patient is very clear in their decision to proceed with implantable loop recorder.   Wound care was reviewed with the patient (keep incision clean and dry for 3 days).  Wound check will be scheduled for the patient.  Please call with questions.   Renee Norberto Sorenson, PA-C 03/08/2018  EP Attending  Patient seen and examined. Agree with the findings as noted above. The patient has had a  cryptogenic stroke. I have reviewed the findings with the patient and he will undergo ILR insertion.  Leonia Reeves.D.

## 2018-03-08 NOTE — Transfer of Care (Signed)
Immediate Anesthesia Transfer of Care Note  Patient: Jesse Price  Procedure(s) Performed: TRANSESOPHAGEAL ECHOCARDIOGRAM (TEE) (N/A )  Patient Location: Endoscopy Unit  Anesthesia Type:MAC  Level of Consciousness: awake and alert   Airway & Oxygen Therapy: Patient Spontanous Breathing and Patient connected to nasal cannula oxygen  Post-op Assessment: Report given to RN and Post -op Vital signs reviewed and stable  Post vital signs: Reviewed and stable  Last Vitals:  Vitals Value Taken Time  BP 133/89 03/08/2018 10:13 AM  Temp 37.2 C 03/08/2018 10:13 AM  Pulse 75 03/08/2018 10:17 AM  Resp 13 03/08/2018 10:17 AM  SpO2 100 % 03/08/2018 10:17 AM  Vitals shown include unvalidated device data.  Last Pain:  Vitals:   03/08/18 1013  TempSrc: Oral  PainSc: 0-No pain         Complications: No apparent anesthesia complications

## 2018-03-08 NOTE — Progress Notes (Signed)
PT Cancellation Note  Patient Details Name: Jesse Price MRN: [id number] DOB: [birth year]   Cancelled Treatment:    Reason Eval/Treat Not Completed: Patient at procedure or test/unavailable, at ENDO   [facility] 03/08/2018, 9:04 AM

## 2018-03-09 ENCOUNTER — Other Ambulatory Visit: Payer: Self-pay

## 2018-03-09 ENCOUNTER — Inpatient Hospital Stay (HOSPITAL_COMMUNITY)
Admission: RE | Admit: 2018-03-09 | Discharge: 2018-03-15 | DRG: 056 | Disposition: A | Discharge status: Home / Self Care | Payer: Commercial Managed Care - PPO | Source: Intra-hospital | Attending: Physical Medicine & Rehabilitation | Admitting: Physical Medicine & Rehabilitation

## 2018-03-09 ENCOUNTER — Encounter (HOSPITAL_COMMUNITY): Payer: Self-pay | Admitting: Cardiology

## 2018-03-09 DIAGNOSIS — I639 Cerebral infarction, unspecified: Secondary | ICD-10-CM | POA: Diagnosis not present

## 2018-03-09 DIAGNOSIS — Z7289 Other problems related to lifestyle: Secondary | ICD-10-CM | POA: Diagnosis not present

## 2018-03-09 DIAGNOSIS — N182 Chronic kidney disease, stage 2 (mild): Secondary | ICD-10-CM | POA: Diagnosis present

## 2018-03-09 DIAGNOSIS — Z8249 Family history of ischemic heart disease and other diseases of the circulatory system: Secondary | ICD-10-CM | POA: Diagnosis not present

## 2018-03-09 DIAGNOSIS — I69354 Hemiplegia and hemiparesis following cerebral infarction affecting left non-dominant side: Secondary | ICD-10-CM | POA: Diagnosis not present

## 2018-03-09 DIAGNOSIS — F1020 Alcohol dependence, uncomplicated: Secondary | ICD-10-CM | POA: Diagnosis not present

## 2018-03-09 DIAGNOSIS — E785 Hyperlipidemia, unspecified: Secondary | ICD-10-CM | POA: Diagnosis present

## 2018-03-09 DIAGNOSIS — Z79899 Other long term (current) drug therapy: Secondary | ICD-10-CM

## 2018-03-09 DIAGNOSIS — I69322 Dysarthria following cerebral infarction: Secondary | ICD-10-CM

## 2018-03-09 DIAGNOSIS — Z716 Tobacco abuse counseling: Secondary | ICD-10-CM | POA: Diagnosis not present

## 2018-03-09 DIAGNOSIS — Z7141 Alcohol abuse counseling and surveillance of alcoholic: Secondary | ICD-10-CM

## 2018-03-09 DIAGNOSIS — I63421 Cerebral infarction due to embolism of right anterior cerebral artery: Secondary | ICD-10-CM | POA: Diagnosis not present

## 2018-03-09 DIAGNOSIS — F17210 Nicotine dependence, cigarettes, uncomplicated: Secondary | ICD-10-CM | POA: Diagnosis present

## 2018-03-09 DIAGNOSIS — I69398 Other sequelae of cerebral infarction: Secondary | ICD-10-CM | POA: Diagnosis not present

## 2018-03-09 DIAGNOSIS — G936 Cerebral edema: Secondary | ICD-10-CM | POA: Diagnosis present

## 2018-03-09 DIAGNOSIS — R269 Unspecified abnormalities of gait and mobility: Secondary | ICD-10-CM | POA: Diagnosis not present

## 2018-03-09 DIAGNOSIS — I129 Hypertensive chronic kidney disease with stage 1 through stage 4 chronic kidney disease, or unspecified chronic kidney disease: Secondary | ICD-10-CM | POA: Diagnosis present

## 2018-03-09 DIAGNOSIS — I6340 Cerebral infarction due to embolism of unspecified cerebral artery: Secondary | ICD-10-CM | POA: Diagnosis present

## 2018-03-09 DIAGNOSIS — I6310 Cerebral infarction due to embolism of unspecified precerebral artery: Secondary | ICD-10-CM | POA: Diagnosis not present

## 2018-03-09 DIAGNOSIS — Z7982 Long term (current) use of aspirin: Secondary | ICD-10-CM

## 2018-03-09 DIAGNOSIS — I10 Essential (primary) hypertension: Secondary | ICD-10-CM | POA: Diagnosis not present

## 2018-03-09 DIAGNOSIS — I5189 Other ill-defined heart diseases: Secondary | ICD-10-CM | POA: Diagnosis not present

## 2018-03-09 LAB — CBC
HEMATOCRIT: 47.2 % (ref 39.0–52.0)
HEMOGLOBIN: 15.7 g/dL (ref 13.0–17.0)
MCH: 30.7 pg (ref 26.0–34.0)
MCHC: 33.3 g/dL (ref 30.0–36.0)
MCV: 92.4 fL (ref 80.0–100.0)
NRBC: 0 % (ref 0.0–0.2)
Platelets: 355 10*3/uL (ref 150–400)
RBC: 5.11 MIL/uL (ref 4.22–5.81)
RDW: 12.6 % (ref 11.5–15.5)
WBC: 11.7 10*3/uL — AB (ref 4.0–10.5)

## 2018-03-09 LAB — CREATININE, SERUM
Creatinine, Ser: 1.38 mg/dL — ABNORMAL HIGH (ref 0.61–1.24)
GFR, EST NON AFRICAN AMERICAN: 57 mL/min — AB (ref >60)

## 2018-03-09 MED ORDER — NICOTINE 21 MG/24HR TD PT24
21.0000 mg | MEDICATED_PATCH | Freq: Every day | TRANSDERMAL | Status: DC
  Filled 2018-03-09 (×6): qty 1

## 2018-03-09 MED ORDER — ENOXAPARIN SODIUM 40 MG/0.4ML ~~LOC~~ SOLN
40.0000 mg | Freq: Every day | SUBCUTANEOUS | Status: DC
  Filled 2018-03-09 (×5): qty 0.4

## 2018-03-09 MED ORDER — THIAMINE HCL 100 MG PO TABS: 100.0000 mg | ORAL_TABLET | Freq: Every day | ORAL | 0 refills | Status: DC

## 2018-03-09 MED ORDER — SORBITOL 70 % SOLN: 30.0000 mL | Freq: Every day | Status: DC | PRN

## 2018-03-09 MED ORDER — THIAMINE HCL 100 MG/ML IJ SOLN
100.0000 mg | Freq: Every day | INTRAMUSCULAR | Status: DC
  Filled 2018-03-09: qty 1

## 2018-03-09 MED ORDER — ENOXAPARIN SODIUM 40 MG/0.4ML ~~LOC~~ SOLN: 40.0000 mg | SUBCUTANEOUS | Status: DC

## 2018-03-09 MED ORDER — ASPIRIN 325 MG PO TABS: 325.0000 mg | ORAL_TABLET | Freq: Every day | ORAL | 0 refills | Status: DC

## 2018-03-09 MED ORDER — FOLIC ACID 1 MG PO TABS
1.0000 mg | ORAL_TABLET | Freq: Every day | ORAL | Status: DC
  Administered 2018-03-10 – 2018-03-15 (×6): 1 mg via ORAL
  Filled 2018-03-09 (×6): qty 1

## 2018-03-09 MED ORDER — ADULT MULTIVITAMIN W/MINERALS CH
1.0000 | ORAL_TABLET | Freq: Every day | ORAL | Status: DC
  Administered 2018-03-10 – 2018-03-15 (×6): 1 via ORAL
  Filled 2018-03-09 (×6): qty 1

## 2018-03-09 MED ORDER — ATORVASTATIN CALCIUM 80 MG PO TABS
80.0000 mg | ORAL_TABLET | Freq: Every day | ORAL | Status: DC
  Administered 2018-03-09 – 2018-03-14 (×6): 80 mg via ORAL
  Filled 2018-03-09 (×6): qty 1

## 2018-03-09 MED ORDER — ASPIRIN 300 MG RE SUPP
300.0000 mg | Freq: Every day | RECTAL | Status: DC
  Filled 2018-03-09: qty 1

## 2018-03-09 MED ORDER — ASPIRIN 300 MG RE SUPP: 300.0000 mg | Freq: Every day | RECTAL | Status: DC

## 2018-03-09 MED ORDER — ENOXAPARIN SODIUM 40 MG/0.4ML ~~LOC~~ SOLN: 40.0000 mg | Freq: Every day | SUBCUTANEOUS | Status: DC

## 2018-03-09 MED ORDER — ASPIRIN 325 MG PO TABS: 325.0000 mg | ORAL_TABLET | Freq: Every day | ORAL | Status: DC

## 2018-03-09 MED ORDER — ADULT MULTIVITAMIN W/MINERALS CH: 1.0000 | ORAL_TABLET | Freq: Every day | ORAL | 0 refills | Status: DC

## 2018-03-09 MED ORDER — VITAMIN B-1 100 MG PO TABS
100.0000 mg | ORAL_TABLET | Freq: Every day | ORAL | Status: DC
  Administered 2018-03-10 – 2018-03-15 (×6): 100 mg via ORAL
  Filled 2018-03-09 (×6): qty 1

## 2018-03-09 MED ORDER — FOLIC ACID 1 MG PO TABS: 1.0000 mg | ORAL_TABLET | Freq: Every day | ORAL | Status: DC

## 2018-03-09 MED ORDER — FOLIC ACID 1 MG PO TABS: 1.0000 mg | ORAL_TABLET | Freq: Every day | ORAL | 0 refills | Status: DC

## 2018-03-09 MED ORDER — ATORVASTATIN CALCIUM 80 MG PO TABS: 80.0000 mg | ORAL_TABLET | Freq: Every day | ORAL | 0 refills | Status: DC

## 2018-03-09 MED ORDER — ASPIRIN 325 MG PO TABS
325.0000 mg | ORAL_TABLET | Freq: Every day | ORAL | Status: DC
  Administered 2018-03-10 – 2018-03-15 (×6): 325 mg via ORAL
  Filled 2018-03-09 (×6): qty 1

## 2018-03-09 MED ORDER — ACETAMINOPHEN 325 MG PO TABS: 325.0000 mg | ORAL_TABLET | ORAL | Status: DC | PRN

## 2018-03-09 MED ORDER — VITAMIN B-1 100 MG PO TABS: 100.0000 mg | ORAL_TABLET | Freq: Every day | ORAL | Status: DC

## 2018-03-09 MED ORDER — THIAMINE HCL 100 MG/ML IJ SOLN
100.0000 mg | Freq: Every day | INTRAMUSCULAR | Status: DC
  Filled 2018-03-09 (×6): qty 1

## 2018-03-09 MED ORDER — ADULT MULTIVITAMIN W/MINERALS CH: 1.0000 | ORAL_TABLET | Freq: Every day | ORAL | Status: DC

## 2018-03-09 MED ORDER — SENNOSIDES-DOCUSATE SODIUM 8.6-50 MG PO TABS: 1.0000 | ORAL_TABLET | Freq: Every evening | ORAL | Status: DC | PRN

## 2018-03-09 NOTE — Discharge Summary (Signed)
Physician Discharge Summary  [name] WUJ:[id number] DOB: 1965-07-10 DOA: 03/05/2018  PCP: Jac Canavan, PA-C  Admit date: 03/05/2018 Discharge date: 03/09/2018  Admitted From: [facility].  Disposition:  CIR   Recommendations for Outpatient Follow-up:  1. Follow up with PCP in 1-2 weeks 2. Please obtain BMP/CBC in one week Please follow up with neurology as recommended.  Please follow up with cardiology for the loop recorder.   Discharge Condition: stable.  CODE STATUS: full code.  Diet recommendation: Heart Healthy   Brief/Interim Summary: Jesse Hendrixis aleft-handed 52 y.o.malewith medical history significant forhypertension, tobacco abuse, chronic kidney disease stage II, and excessive daily alcohol use, now presenting to the emergency department for evaluation of weakness involving his left arm and leg. The patient reports that he was in his usual state of health until the evening of 03/04/2018 when he developed acute onset of weakness involving the left arm and leg. Neurology was consulted by the ED physician and recommends a medical admission for further evaluation of acute ischemic CVA.  Discharge Diagnoses:  Principal Problem:   Acute ischemic stroke [facility]) Active Problems:   Essential hypertension   CKD (chronic kidney disease), stage II   Alcohol dependence (HCC)   Cerebrovascular accident (CVA) (HCC)   Left-sided weakness   Benign essential HTN   Tobacco abuse   Diastolic dysfunction   Leukocytosis  Right-sided frontal lobe infarct possibly embolic as he has multiple old infarcts. And continues to have left hemiparesis, but improving  Follow-up with CT angiogram of the head and neck.  MRI of the head showed acute/subacute infarction in the right posterior medial frontal lobe with multiple old infarcts.  Echocardiogram ordered and showed  cavity size was normal. Wall thickness was normal. Systolic function was normal. The estimated  ejection fraction was in the range of 55% to 60%. Wall motion was normal; there were no regional wall motion abnormalities. Doppler parameters are consistent with abnormal left ventricular relaxation, grade 1 diastolic dysfunction.  LDL is 75 resume Lipitor Hemoglobin A1c 5.6 continue with SSI. Neurology consulted and recommendations given. Aspirin 325 mg daily Therapy evaluations are recommending CIR Loop recorder placed. TEE did not show any thrombus and no PFO,  Plan for inpatient rehab in am after insurance auth.      Hyperlipidemia LDL is 75, currently on Lipitor 80 mg.   Hypertension  restart norvasc on discharge.   EtOH abuse  no withdrawal symptoms.   Acute on stage II CKD creatinine 1.6 Hydrate with normal saline and repeat renal para meters in the morning show much improvement.    Mild leukocytosis No signs of infection repeat CBC in the morning show improvement.    Discharge Instructions  Discharge Instructions    Diet - low sodium heart healthy   Complete by:  As directed    Discharge instructions   Complete by:  As directed    Please follow up with NEUROLOGY as recommended.     Allergies as of 03/09/2018   No Known Allergies     Medication List    STOP taking these medications   ibuprofen 800 MG tablet Commonly known as:  ADVIL,MOTRIN   mupirocin ointment 2 % Commonly known as:  BACTROBAN   rOPINIRole 0.5 MG tablet Commonly known as:  REQUIP     TAKE these medications   amLODipine 5 MG tablet Commonly known as:  NORVASC TAKE 1 TABLET BY MOUTH ONCE DAILY   aspirin 325 MG tablet Take 1 tablet (325 mg total) by mouth  daily. Start taking on:  03/10/2018   atorvastatin 80 MG tablet Commonly known as:  LIPITOR Take 1 tablet (80 mg total) by mouth daily at 6 PM.   folic acid 1 MG tablet Commonly known as:  FOLVITE Take 1 tablet (1 mg total) by mouth daily. Start taking on:  03/10/2018   multivitamin with minerals  Tabs tablet Take 1 tablet by mouth daily. Start taking on:  03/10/2018   thiamine 100 MG tablet Take 1 tablet (100 mg total) by mouth daily. Start taking on:  03/10/2018      Follow-up Information    [facility] Follow up.   Specialty:  Cardiology Why:  03/23/18 @ 12:00PM (noon), wound check visit Contact information: [address] [place] [address] [phone]       Tysinger, Kermit Balo, PA-C. Schedule an appointment as soon as possible for a visit in 1 week(s).   Specialty:  Family Medicine Contact information: [address] [place] [address] [phone]        Micki Riley, MD. Schedule an appointment as soon as possible for a visit in 1 month(s).   Specialties:  Neurology, Radiology Contact information: [address] [place] [address] [phone]          No Known Allergies  Consultations:  Neurology  Cardiology  Physical therapy   Procedures/Studies: Ct Angio Head W Or Wo Contrast  Result Date: 03/06/2018 CLINICAL DATA:  52 y/o  M; stroke for follow-up. EXAM: CT ANGIOGRAPHY HEAD AND NECK TECHNIQUE: Multidetector CT imaging of the head and neck was performed using the standard protocol during bolus administration of intravenous contrast. Multiplanar CT image reconstructions and MIPs were obtained to evaluate the vascular anatomy. Carotid stenosis measurements (when applicable) are obtained utilizing NASCET criteria, using the distal internal carotid diameter as the denominator. CONTRAST:  50mL ISOVUE-370 IOPAMIDOL (ISOVUE-370) INJECTION 76% COMPARISON:  03/05/2018 MRI of the head. FINDINGS: CTA NECK FINDINGS Aortic arch: Bovine variant branching. Imaged portion shows no evidence of aneurysm or dissection. No significant stenosis of the major arch vessel origins. Right carotid system: No evidence of dissection, stenosis (50% or greater) or occlusion. Right carotid bifurcation  mixed plaque minimal less than 30% proximal ICA stenosis. Left carotid system: No evidence of dissection, stenosis (50% or greater) or occlusion. Left carotid bifurcation mixed plaque with minimal less than 30% proximal ICA stenosis. Vertebral arteries: Codominant. No evidence of dissection, stenosis (50% or greater) or occlusion. Skeleton: Negative. Other neck: Negative. Upper chest: Negative. Review of the MIP images confirms the above findings CTA HEAD FINDINGS Anterior circulation: No significant stenosis, proximal occlusion, aneurysm, or vascular malformation. Mild non stenotic calcific atherosclerosis of carotid siphons. Posterior circulation: No significant stenosis, proximal occlusion, aneurysm, or vascular malformation. Moderate right P2 stenosis. Venous sinuses: As permitted by contrast timing, patent. Anatomic variants: Anterior communicating artery. No posterior communicating artery identified, likely hypoplastic or absent. Delayed phase: No abnormal intracranial enhancement. Review of the MIP images confirms the above findings IMPRESSION: 1. Patent carotid and vertebral arteries. No dissection, aneurysm, or hemodynamically significant stenosis utilizing NASCET criteria. 2. Patent anterior and posterior intracranial circulation. No large vessel occlusion, aneurysm, or high-grade stenosis. 3. Moderate right P2 PCA stenosis. 4. Non stenotic stenotic atherosclerosis of carotid bifurcations and carotid siphons. Electronically Signed   By: Mitzi Hansen M.D.   On: 03/06/2018 02:32   Ct Head Wo Contrast  Result Date: 03/05/2018 CLINICAL DATA:  Left upper and lower extremity  weakness for approximately 24 hours EXAM: CT HEAD WITHOUT CONTRAST TECHNIQUE: Contiguous axial images were obtained from the base of the skull through the vertex without intravenous contrast. COMPARISON:  None. FINDINGS: Brain: The ventricles are normal in size and duration. There is no demonstrable mass, hemorrhage,  extra-axial fluid collection, or midline shift. There is evidence of localized encephalomalacia in the mid right frontal lobe, likely due to prior infarct. Adjacent to this encephalomalacia, there Is apparent cytotoxic edema at the gray-white junction of the right mid frontal lobe, suspicious for recent and probably acute infarct adjacent to an area of encephalomalacia. This edema extends into the anterior superior right centrum semiovale, felt to be part of this recent, likely acute infarct. Elsewhere, the brain parenchyma appears unremarkable. Vascular: No evident hyperdense vessel. There is calcification in each carotid siphon region. Skull: Bony calvarium appears intact. Sinuses/Orbits: There is mucosal thickening in several ethmoid air cells. Other visualized paranasal sinuses are clear. Visualized orbits appear symmetric bilaterally. Other: Mastoid air cells are clear. IMPRESSION: 1. Cytotoxic edema in the mid right frontal lobe extending into the adjacent anterior superior right centrum semiovale. This appearance is felt to represent probable acute infarct immediately adjacent to an older infarct with encephalomalacia in the periphery of the mid right frontal lobe. No other evident acute infarct. No mass or hemorrhage. Brain parenchyma otherwise appears unremarkable. 2.  There is mucosal thickening in several ethmoid air cells. Electronically Signed   By: Bretta Bang III M.D.   On: 03/05/2018 19:48   Ct Angio Neck W Or Wo Contrast  Result Date: 03/06/2018 CLINICAL DATA:  52 y/o  M; stroke for follow-up. EXAM: CT ANGIOGRAPHY HEAD AND NECK TECHNIQUE: Multidetector CT imaging of the head and neck was performed using the standard protocol during bolus administration of intravenous contrast. Multiplanar CT image reconstructions and MIPs were obtained to evaluate the vascular anatomy. Carotid stenosis measurements (when applicable) are obtained utilizing NASCET criteria, using the distal internal carotid  diameter as the denominator. CONTRAST:  50mL ISOVUE-370 IOPAMIDOL (ISOVUE-370) INJECTION 76% COMPARISON:  03/05/2018 MRI of the head. FINDINGS: CTA NECK FINDINGS Aortic arch: Bovine variant branching. Imaged portion shows no evidence of aneurysm or dissection. No significant stenosis of the major arch vessel origins. Right carotid system: No evidence of dissection, stenosis (50% or greater) or occlusion. Right carotid bifurcation mixed plaque minimal less than 30% proximal ICA stenosis. Left carotid system: No evidence of dissection, stenosis (50% or greater) or occlusion. Left carotid bifurcation mixed plaque with minimal less than 30% proximal ICA stenosis. Vertebral arteries: Codominant. No evidence of dissection, stenosis (50% or greater) or occlusion. Skeleton: Negative. Other neck: Negative. Upper chest: Negative. Review of the MIP images confirms the above findings CTA HEAD FINDINGS Anterior circulation: No significant stenosis, proximal occlusion, aneurysm, or vascular malformation. Mild non stenotic calcific atherosclerosis of carotid siphons. Posterior circulation: No significant stenosis, proximal occlusion, aneurysm, or vascular malformation. Moderate right P2 stenosis. Venous sinuses: As permitted by contrast timing, patent. Anatomic variants: Anterior communicating artery. No posterior communicating artery identified, likely hypoplastic or absent. Delayed phase: No abnormal intracranial enhancement. Review of the MIP images confirms the above findings IMPRESSION: 1. Patent carotid and vertebral arteries. No dissection, aneurysm, or hemodynamically significant stenosis utilizing NASCET criteria. 2. Patent anterior and posterior intracranial circulation. No large vessel occlusion, aneurysm, or high-grade stenosis. 3. Moderate right P2 PCA stenosis. 4. Non stenotic stenotic atherosclerosis of carotid bifurcations and carotid siphons. Electronically Signed   By: Mitzi Hansen M.D.   On:  03/06/2018 02:32   Mr Brain Wo Contrast  Result Date: 03/05/2018 CLINICAL DATA:  52 y/o  M; left-sided weakness. EXAM: MRI HEAD WITHOUT CONTRAST TECHNIQUE: Multiplanar, multiecho pulse sequences of the brain and surrounding structures were obtained without intravenous contrast. COMPARISON:  03/05/2018 CT head. FINDINGS: Brain: Small region of cortical reduced diffusion within the right posteromedial frontal lobe inclusive of precentral gyrus compatible with acute/early subacute infarction (series 5, image 89). No associated hemorrhage or mass effect. No extra-axial collection, hydrocephalus, or herniation. Small chronic cortical infarctions are present within the lateral aspect of frontal lobes bilaterally. And there are very small chronic infarctions within the high right parietal and left posterior temporal cortices. Mild-to-moderate volume loss of the brain for age. No abnormal susceptibility hypointensity to indicate intracranial hemorrhage. Vascular: Normal flow voids. Skull and upper cervical spine: Normal marrow signal. Sinuses/Orbits: Mild ethmoid sinus mucosal thickening. No abnormal signal of mastoid air cells. Orbits are unremarkable Other: None. IMPRESSION: 1. Small acute/early subacute infarction within the right posteromedial frontal lobe inclusive of precentral gyrus. No associated hemorrhage or mass effect. 2. Multiple small chronic infarctions within the bilateral frontal lobes, right parietal lobe, and left posterior temporal lobe. 3. Mild to moderate volume loss of the brain for age. These results will be called to the ordering clinician or representative by the Radiologist Assistant, and communication documented in the PACS or zVision Dashboard. Electronically Signed   By: Mitzi Hansen M.D.   On: 03/05/2018 23:43       Subjective: No chest pain or sob.   Discharge Exam: Vitals:   03/09/18 0343 03/09/18 0735  BP: (!) 160/113 (!) 142/90  Pulse: 79 62  Resp: 18 20   Temp: 98 F (36.7 C) 97.8 F (36.6 C)  SpO2: 100% 99%   Vitals:   03/08/18 2338 03/09/18 0000 03/09/18 0343 03/09/18 0735  BP: 120/84  (!) 160/113 (!) 142/90  Pulse: 78 78 79 62  Resp: 18  18 20   Temp: 98.8 F (37.1 C)  98 F (36.7 C) 97.8 F (36.6 C)  TempSrc: Oral  Oral Oral  SpO2: 100%  100% 99%  Weight:      Height:        General: Pt is alert, awake, not in acute distress Cardiovascular: RRR, S1/S2 +, no rubs, no gallops Respiratory: CTA bilaterally, no wheezing, no rhonchi Abdominal: Soft, NT, ND, bowel sounds + Extremities: no edema, no cyanosis    The results of significant diagnostics from this hospitalization (including imaging, microbiology, ancillary and laboratory) are listed below for reference.     Microbiology: No results found for this or any previous visit (from the past 240 hour(s)).   Labs: BNP (last 3 results) No results for input(s): BNP in the last 8760 hours. Basic Metabolic Panel: Recent Labs  Lab 03/05/18 1948 03/05/18 2008 03/07/18 0715  NA 144 147* 138  K 4.0 4.2 4.2  CL 108 107 106  CO2 27  --  26  GLUCOSE 100* 97 93  BUN 13 16 11   CREATININE 1.33* 1.60* 1.29*  CALCIUM 9.3  --  9.3   Liver Function Tests: Recent Labs  Lab 03/05/18 1948  AST 32  ALT 19  ALKPHOS 57  BILITOT 0.6  PROT 7.6  ALBUMIN 4.7   No results for input(s): LIPASE, AMYLASE in the last 168 hours. No results for input(s): AMMONIA in the last 168 hours. CBC: Recent Labs  Lab 03/05/18 1948 03/05/18 2008 03/07/18 0715  WBC 12.2*  --  11.8*  NEUTROABS 6.6  --   --   HGB 15.9 16.7 14.4  HCT 49.2 49.0 43.5  MCV 94.6  --  92.6  PLT 397  --  342   Cardiac Enzymes: No results for input(s): CKTOTAL, CKMB, CKMBINDEX, TROPONINI in the last 168 hours. BNP: Invalid input(s): POCBNP CBG: No results for input(s): GLUCAP in the last 168 hours. D-Dimer No results for input(s): DDIMER in the last 72 hours. Hgb A1c No results for input(s): HGBA1C in the  last 72 hours. Lipid Profile No results for input(s): CHOL, HDL, LDLCALC, TRIG, CHOLHDL, LDLDIRECT in the last 72 hours. Thyroid function studies No results for input(s): TSH, T4TOTAL, T3FREE, THYROIDAB in the last 72 hours.  Invalid input(s): FREET3 Anemia work up No results for input(s): VITAMINB12, FOLATE, FERRITIN, TIBC, IRON, RETICCTPCT in the last 72 hours. Urinalysis No results found for: COLORURINE, APPEARANCEUR, LABSPEC, PHURINE, GLUCOSEU, HGBUR, BILIRUBINUR, KETONESUR, PROTEINUR, UROBILINOGEN, NITRITE, LEUKOCYTESUR Sepsis Labs Invalid input(s): PROCALCITONIN,  WBC,  LACTICIDVEN Microbiology No results found for this or any previous visit (from the past 240 hour(s)).   Time coordinating discharge: 35 minutes  SIGNED:   Kathlen Mody, MD  [facility] 03/09/2018, 9:40 AM Pager   If 7PM-7AM, please contact night-coverage [email or web address] Password [email or web address]

## 2018-03-09 NOTE — Progress Notes (Signed)
Inpatient Rehabilitation-Admissions Coordinator   [facility] has received insurance approval and medical approval for admit to CIR today. AC has notified pt and will contact floor RN, CM, and SW regarding plan. Please call if questions.   Nanine Means, OTR/L  Rehab Admissions Coordinator  [phone] 03/09/2018 9:07 AM

## 2018-03-09 NOTE — Plan of Care (Signed)
  Problem: Consults Goal: RH STROKE PATIENT EDUCATION Description See Patient Education module for education specifics  Outcome: Not Progressing Goal: Diabetes Guidelines if Diabetic/Glucose > 140 Description If diabetic or lab glucose is > 140 mg/dl - Initiate Diabetes/Hyperglycemia Guidelines & Document Interventions  Outcome: Not Progressing   Problem: RH BOWEL ELIMINATION Goal: RH STG MANAGE BOWEL WITH ASSISTANCE Description STG Manage Bowel with min Assistance.  Outcome: Not Progressing Goal: RH STG MANAGE BOWEL W/MEDICATION W/ASSISTANCE Description STG Manage Bowel with Medication with min Assistance.  Outcome: Not Progressing   Problem: RH BLADDER ELIMINATION Goal: RH STG MANAGE BLADDER WITH ASSISTANCE Description STG Manage Bladder With min Assistance  Outcome: Not Progressing Goal: RH STG MANAGE BLADDER WITH MEDICATION WITH ASSISTANCE Description STG Manage Bladder With Medication With min Assistance.  Outcome: Not Progressing   Problem: RH SKIN INTEGRITY Goal: RH STG SKIN FREE OF INFECTION/BREAKDOWN Outcome: Not Progressing Goal: RH STG MAINTAIN SKIN INTEGRITY WITH ASSISTANCE Description STG Maintain Skin Integrity With min Assistance.  Outcome: Not Progressing   Problem: RH SAFETY Goal: RH STG ADHERE TO SAFETY PRECAUTIONS W/ASSISTANCE/DEVICE Description STG Adhere to Safety Precautions With min Assistance/Device.  Outcome: Not Progressing   Problem: RH COGNITION-NURSING Goal: RH STG USES MEMORY AIDS/STRATEGIES W/ASSIST TO PROBLEM SOLVE Description STG Uses Memory Aids/Strategies With Assistance to Problem Solve. Outcome: Not Progressing   Problem: RH PAIN MANAGEMENT Goal: RH STG PAIN MANAGED AT OR BELOW PT'S PAIN GOAL Outcome: Not Progressing   Problem: RH KNOWLEDGE DEFICIT Goal: RH STG INCREASE KNOWLEDGE OF HYPERTENSION Outcome: Not Progressing Goal: RH STG INCREASE KNOWLEGDE OF HYPERLIPIDEMIA Outcome: Not Progressing Goal: RH STG INCREASE  KNOWLEDGE OF STROKE PROPHYLAXIS Outcome: Not Progressing  New admit

## 2018-03-09 NOTE — Progress Notes (Signed)
Patient information reviewed and entered into eRehab system by Jatoya Armbrister, RN, CRRN, PPS Coordinator.  Information including medical coding, functional ability and quality indicators will be reviewed and updated through discharge.    

## 2018-03-09 NOTE — Progress Notes (Signed)
Physical Medicine and Rehabilitation Consult Reason for Consult: Left-sided weakness Referring Physician: [facility]   HPI: Jesse Price is a 52 y.o. right-handed male with history of hypertension, tobacco abuse, chronic kidney disease stage II, alcohol use.  Per chart review and patient, patient lives with his sister.  Independent prior to admission.  Working full-time.  One level home with 3 steps to entry.  Sister works during the day.  Presented 03/05/2018 with left-sided weakness.  Cranial CT scan showed cytotoxic edema in the right mid frontal lobe extending into the adjacent anterior superior right centrum semi-ovale.  Patient did not receive TPA.  MRI brain reviewed, showing right frontal lobe infarct. Per report, small acute early subacute infarction within the right posterior medial frontal lobe inclusive of precentral gyrus.  No associated hemorrhage.  Multiple small chronic infarcts within the bilateral frontal lobes, right parietal lobe and left posterior temporal lobe.  CT angiogram of head and neck with no dissection or aneurysm.  Echocardiogram with ejection fraction of 60% grade 1 diastolic dysfunction.  Neurology follow-up currently on aspirin for CVA prophylaxis.  Subcutaneous Lovenox for DVT prophylaxis.  Await plan for TEE and loop recorder.  Therapy evaluation completed with recommendations of physical medicine rehab consult.  Review of Systems  Constitutional: Negative for chills and fever.  HENT: Negative for hearing loss.   Eyes: Negative for blurred vision.  Respiratory: Negative for cough and shortness of breath.   Cardiovascular: Negative for chest pain, palpitations and leg swelling.  Gastrointestinal: Positive for constipation. Negative for nausea and vomiting.  Genitourinary: Negative for dysuria, flank pain and hematuria.  Musculoskeletal: Positive for myalgias.  Skin: Negative for rash.  Neurological: Positive for sensory change, speech change and focal weakness.   All other systems reviewed and are negative.      Past Medical History:  Diagnosis Date  . Asthma   . Hypertension    History reviewed. No pertinent surgical history.      Family History  Problem Relation Age of Onset  . Cancer Mother        brain  . Other Father        died in fire  . Hypertension Sister   . Hypertension Brother   . Cancer Brother        back  . Hypertension Sister   . Hypertension Sister   . Heart disease Neg Hx   . Stroke Neg Hx    Social History:  reports that he has been smoking cigarettes. He has been smoking about 0.50 packs per day. He has never used smokeless tobacco. He reports that he drinks about 28.0 standard drinks of alcohol per week. He reports that he does not use drugs. Allergies: No Known Allergies       Medications Prior to Admission  Medication Sig Dispense Refill  . amLODipine (NORVASC) 5 MG tablet TAKE 1 TABLET BY MOUTH ONCE DAILY (Patient taking differently: Take 5 mg by mouth daily. ) 30 tablet 0  . ibuprofen (ADVIL,MOTRIN) 800 MG tablet Take 1 tablet (800 mg total) by mouth every 8 (eight) hours as needed for mild pain or moderate pain. (Patient not taking: Reported on 03/05/2018) 15 tablet 0  . mupirocin ointment (BACTROBAN) 2 % Place 1 application into the nose 3 (three) times daily. (Patient not taking: Reported on 03/05/2018) 22 g 0  . rOPINIRole (REQUIP) 0.5 MG tablet Take 1 tablet (0.5 mg total) 3 (three) times daily by mouth. 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)  Functional Status:  Mobility:  ADL:  Cognition: Cognition Overall Cognitive Status: Impaired/Different from baseline Arousal/Alertness: Awake/alert Orientation Level: Oriented X4 Attention: Sustained, Selective Sustained Attention: Impaired Sustained Attention Impairment: Verbal complex Selective Attention: Impaired Selective Attention Impairment: Verbal basic, Functional basic Memory: Impaired Memory Impairment: Storage deficit(delayed recall 0/5, 2/5 with category cue) Awareness: Impaired Awareness Impairment: Intellectual impairment, Emergent impairment, Anticipatory impairment Problem Solving: Impaired Problem Solving Impairment: Verbal basic Executive Function: Organizing, Self Monitoring, Self Correcting Organizing: Impaired Organizing Impairment: Functional basic(clockdrawing impaired 0/3) Self Monitoring: Impaired Self Monitoring Impairment: Verbal basic, Functional basic Self Correcting: Impaired Self Correcting Impairment: Verbal basic, Functional basic Behaviors: Impulsive Safety/Judgment: Impaired Comments: decreased awareness of deficits, impulsivity Cognition Overall Cognitive Status: Impaired/Different from baseline  Blood pressure (!) 160/87, pulse 62, temperature 97.9 F (36.6 C), temperature source Oral, resp. rate 18, height 5\' 6"  (1.676 m), weight 74.8 kg, SpO2 100 %. Physical Exam  Vitals reviewed. Constitutional: He is oriented to person, place, and time. He appears well-developed and well-nourished.  HENT:  Head: Normocephalic and atraumatic.  Eyes: EOM are normal. Right eye exhibits no discharge. Left eye exhibits no discharge.  Neck: Normal range of motion. Neck supple. No thyromegaly present.  Cardiovascular: Normal rate and regular rhythm.  Respiratory: Effort normal and breath sounds  normal. No respiratory distress.  GI: Soft. Bowel sounds are normal. He exhibits no distension.  Musculoskeletal:  No edema or tenderness in extremities  Neurological: He is alert and oriented to person, place, and time.  Follows full commands.   Fair awareness of deficits. Dysarthria Motor: RUE: 4+/5 proximal to distal RLE: 4/5 proximal to distal LUE: 3-/5 proximal to distal LLE: HF, KE 3--3/5, ADF 2/5 Sensation diminished to light touch left hand  Skin: Skin is warm and dry.  Psychiatric: His affect is blunt. His speech is slurred.    LabResultsLast24Hours       Results for orders placed or performed during the hospital encounter of 03/05/18 (from the past 24 hour(s))  CBC     Status: Abnormal   Collection Time: 03/07/18  7:15 AM  Result Value Ref Range   WBC 11.8 (H) 4.0 - 10.5 K/uL   RBC 4.70 4.22 - 5.81 MIL/uL   Hemoglobin 14.4 13.0 - 17.0 g/dL   HCT 16.1 09.6 - 04.5 %   MCV 92.6 80.0 - 100.0 fL   MCH 30.6 26.0 - 34.0 pg   MCHC 33.1 30.0 - 36.0 g/dL   RDW 40.9 81.1 - 91.4 %   Platelets 342 150 - 400 K/uL   nRBC 0.0 0.0 - 0.2 %  Basic metabolic panel     Status: Abnormal   Collection Time: 03/07/18  7:15 AM  Result Value Ref Range   Sodium 138 135 - 145 mmol/L   Potassium 4.2 3.5 - 5.1 mmol/L   Chloride 106 98 - 111 mmol/L   CO2 26 22 - 32 mmol/L   Glucose, Bld 93 70 - 99 mg/dL   BUN 11 6 - 20 mg/dL   Creatinine, Ser 7.82 (H) 0.61 - 1.24 mg/dL  Calcium 9.3 8.9 - 10.3 mg/dL   GFR calc non Af Amer >60 >60 mL/min   GFR calc Af Amer >60 >60 mL/min   Anion gap 6 5 - 15      ImagingResults(Last48hours)  Ct Angio Head W Or Wo Contrast  Result Date: 03/06/2018 CLINICAL DATA:  52 y/o  M; stroke for follow-up. EXAM: CT ANGIOGRAPHY HEAD AND NECK TECHNIQUE: Multidetector CT imaging of the head and neck was performed using the standard protocol during bolus administration of intravenous contrast. Multiplanar CT image reconstructions and  MIPs were obtained to evaluate the vascular anatomy. Carotid stenosis measurements (when applicable) are obtained utilizing NASCET criteria, using the distal internal carotid diameter as the denominator. CONTRAST:  50mL ISOVUE-370 IOPAMIDOL (ISOVUE-370) INJECTION 76% COMPARISON:  03/05/2018 MRI of the head. FINDINGS: CTA NECK FINDINGS Aortic arch: Bovine variant branching. Imaged portion shows no evidence of aneurysm or dissection. No significant stenosis of the major arch vessel origins. Right carotid system: No evidence of dissection, stenosis (50% or greater) or occlusion. Right carotid bifurcation mixed plaque minimal less than 30% proximal ICA stenosis. Left carotid system: No evidence of dissection, stenosis (50% or greater) or occlusion. Left carotid bifurcation mixed plaque with minimal less than 30% proximal ICA stenosis. Vertebral arteries: Codominant. No evidence of dissection, stenosis (50% or greater) or occlusion. Skeleton: Negative. Other neck: Negative. Upper chest: Negative. Review of the MIP images confirms the above findings CTA HEAD FINDINGS Anterior circulation: No significant stenosis, proximal occlusion, aneurysm, or vascular malformation. Mild non stenotic calcific atherosclerosis of carotid siphons. Posterior circulation: No significant stenosis, proximal occlusion, aneurysm, or vascular malformation. Moderate right P2 stenosis. Venous sinuses: As permitted by contrast timing, patent. Anatomic variants: Anterior communicating artery. No posterior communicating artery identified, likely hypoplastic or absent. Delayed phase: No abnormal intracranial enhancement. Review of the MIP images confirms the above findings IMPRESSION: 1. Patent carotid and vertebral arteries. No dissection, aneurysm, or hemodynamically significant stenosis utilizing NASCET criteria. 2. Patent anterior and posterior intracranial circulation. No large vessel occlusion, aneurysm, or high-grade stenosis. 3. Moderate right  P2 PCA stenosis. 4. Non stenotic stenotic atherosclerosis of carotid bifurcations and carotid siphons. Electronically Signed   By: Mitzi Hansen M.D.   On: 03/06/2018 02:32   Ct Head Wo Contrast  Result Date: 03/05/2018 CLINICAL DATA:  Left upper and lower extremity weakness for approximately 24 hours EXAM: CT HEAD WITHOUT CONTRAST TECHNIQUE: Contiguous axial images were obtained from the base of the skull through the vertex without intravenous contrast. COMPARISON:  None. FINDINGS: Brain: The ventricles are normal in size and duration. There is no demonstrable mass, hemorrhage, extra-axial fluid collection, or midline shift. There is evidence of localized encephalomalacia in the mid right frontal lobe, likely due to prior infarct. Adjacent to this encephalomalacia, there Is apparent cytotoxic edema at the gray-white junction of the right mid frontal lobe, suspicious for recent and probably acute infarct adjacent to an area of encephalomalacia. This edema extends into the anterior superior right centrum semiovale, felt to be part of this recent, likely acute infarct. Elsewhere, the brain parenchyma appears unremarkable. Vascular: No evident hyperdense vessel. There is calcification in each carotid siphon region. Skull: Bony calvarium appears intact. Sinuses/Orbits: There is mucosal thickening in several ethmoid air cells. Other visualized paranasal sinuses are clear. Visualized orbits appear symmetric bilaterally. Other: Mastoid air cells are clear. IMPRESSION: 1. Cytotoxic edema in the mid right frontal lobe extending into the adjacent anterior superior right centrum semiovale. This appearance is felt to represent probable acute  infarct immediately adjacent to an older infarct with encephalomalacia in the periphery of the mid right frontal lobe. No other evident acute infarct. No mass or hemorrhage. Brain parenchyma otherwise appears unremarkable. 2.  There is mucosal thickening in several ethmoid  air cells. Electronically Signed   By: Bretta Bang III M.D.   On: 03/05/2018 19:48   Ct Angio Neck W Or Wo Contrast  Result Date: 03/06/2018 CLINICAL DATA:  52 y/o  M; stroke for follow-up. EXAM: CT ANGIOGRAPHY HEAD AND NECK TECHNIQUE: Multidetector CT imaging of the head and neck was performed using the standard protocol during bolus administration of intravenous contrast. Multiplanar CT image reconstructions and MIPs were obtained to evaluate the vascular anatomy. Carotid stenosis measurements (when applicable) are obtained utilizing NASCET criteria, using the distal internal carotid diameter as the denominator. CONTRAST:  50mL ISOVUE-370 IOPAMIDOL (ISOVUE-370) INJECTION 76% COMPARISON:  03/05/2018 MRI of the head. FINDINGS: CTA NECK FINDINGS Aortic arch: Bovine variant branching. Imaged portion shows no evidence of aneurysm or dissection. No significant stenosis of the major arch vessel origins. Right carotid system: No evidence of dissection, stenosis (50% or greater) or occlusion. Right carotid bifurcation mixed plaque minimal less than 30% proximal ICA stenosis. Left carotid system: No evidence of dissection, stenosis (50% or greater) or occlusion. Left carotid bifurcation mixed plaque with minimal less than 30% proximal ICA stenosis. Vertebral arteries: Codominant. No evidence of dissection, stenosis (50% or greater) or occlusion. Skeleton: Negative. Other neck: Negative. Upper chest: Negative. Review of the MIP images confirms the above findings CTA HEAD FINDINGS Anterior circulation: No significant stenosis, proximal occlusion, aneurysm, or vascular malformation. Mild non stenotic calcific atherosclerosis of carotid siphons. Posterior circulation: No significant stenosis, proximal occlusion, aneurysm, or vascular malformation. Moderate right P2 stenosis. Venous sinuses: As permitted by contrast timing, patent. Anatomic variants: Anterior communicating artery. No posterior communicating artery  identified, likely hypoplastic or absent. Delayed phase: No abnormal intracranial enhancement. Review of the MIP images confirms the above findings IMPRESSION: 1. Patent carotid and vertebral arteries. No dissection, aneurysm, or hemodynamically significant stenosis utilizing NASCET criteria. 2. Patent anterior and posterior intracranial circulation. No large vessel occlusion, aneurysm, or high-grade stenosis. 3. Moderate right P2 PCA stenosis. 4. Non stenotic stenotic atherosclerosis of carotid bifurcations and carotid siphons. Electronically Signed   By: Mitzi Hansen M.D.   On: 03/06/2018 02:32   Mr Brain Wo Contrast  Result Date: 03/05/2018 CLINICAL DATA:  52 y/o  M; left-sided weakness. EXAM: MRI HEAD WITHOUT CONTRAST TECHNIQUE: Multiplanar, multiecho pulse sequences of the brain and surrounding structures were obtained without intravenous contrast. COMPARISON:  03/05/2018 CT head. FINDINGS: Brain: Small region of cortical reduced diffusion within the right posteromedial frontal lobe inclusive of precentral gyrus compatible with acute/early subacute infarction (series 5, image 89). No associated hemorrhage or mass effect. No extra-axial collection, hydrocephalus, or herniation. Small chronic cortical infarctions are present within the lateral aspect of frontal lobes bilaterally. And there are very small chronic infarctions within the high right parietal and left posterior temporal cortices. Mild-to-moderate volume loss of the brain for age. No abnormal susceptibility hypointensity to indicate intracranial hemorrhage. Vascular: Normal flow voids. Skull and upper cervical spine: Normal marrow signal. Sinuses/Orbits: Mild ethmoid sinus mucosal thickening. No abnormal signal of mastoid air cells. Orbits are unremarkable Other: None. IMPRESSION: 1. Small acute/early subacute infarction within the right posteromedial frontal lobe inclusive of precentral gyrus. No associated hemorrhage or mass  effect. 2. Multiple small chronic infarctions within the bilateral frontal lobes, right parietal lobe, and  left posterior temporal lobe. 3. Mild to moderate volume loss of the brain for age. These results will be called to the ordering clinician or representative by the Radiologist Assistant, and communication documented in the PACS or zVision Dashboard. Electronically Signed   By: Mitzi Hansen M.D.   On: 03/05/2018 23:43     Assessment/Plan: Diagnosis: Infarction within the right posterior medial frontal lobe  Labs and images (see above) independently reviewed.  Records reviewed and summated above. Stroke: Continue secondary stroke prophylaxis and Risk Factor Modification listed below:   Antiplatelet therapy:   Blood Pressure Management:  Continue current medication with prn's with permisive HTN per primary team Statin Agent:   Tobacco abuse:   Left sided hemiparesis: fit for orthosis to prevent contractures (PRAFO, etc) Motor recovery: Fluoxetine  1. Does the need for close, 24 hr/day medical supervision in concert with the patient's rehab needs make it unreasonable for this patient to be served in a less intensive setting? Yes  2. Co-Morbidities requiring supervision/potential complications: HTN (monitor and provide prns in accordance with increased physical exertion and pain), chronic kidney disease stage II (avoid nephrotoxic meds), alcohol/tobacco abuse (counsel), diastolic dysfunction (monitor for signs/symptoms of fluid overload), leukocytosis (repeat labs, cont to monitor for signs and symptoms of infection, further workup if indicated)   3. Due to safety, disease management and patient education, does the patient require 24 hr/day rehab nursing? Yes 4. Does the patient require coordinated care of a physician, rehab nurse, PT (1-2 hrs/day, 5 days/week) and OT (1-2 hrs/day, 5 days/week) to address physical and functional deficits in the context of the above medical  diagnosis(es)? Yes Addressing deficits in the following areas: balance, endurance, locomotion, strength, transferring, bowel/bladder control, toileting and psychosocial support 5. Can the patient actively participate in an intensive therapy program of at least 3 hrs of therapy per day at least 5 days per week? Yes 6. The potential for patient to make measurable gains while on inpatient rehab is excellent 7. Anticipated functional outcomes upon discharge from inpatient rehab are modified independent and supervision  with PT, modified independent and supervision with OT, n/a with SLP. 8. Estimated rehab length of stay to reach the above functional goals is: 8-11 days. 9. Anticipated D/C setting: Home 10. Anticipated post D/C treatments: HH therapy and Home excercise program 11. Overall Rehab/Functional Prognosis: good  RECOMMENDATIONS: This patient's condition is appropriate for continued rehabilitative care in the following setting: Will await competion of therapy evals.  Likely CIR after completion of medical workup.  Patient has agreed to participate in recommended program. Yes Note that insurance prior authorization may be required for reimbursement for recommended care.  Comment: Rehab Admissions Coordinator to follow up.   I have personally performed a face to face diagnostic evaluation, including, but not limited to relevant history and physical exam findings, of this patient and developed relevant assessment and plan.  Additionally, I have reviewed and concur with the physician assistant's documentation above.   Maryla Morrow, MD, ABPMR Mcarthur Rossetti Angiulli, PA-C 03/07/2018        Revision History                        Routing History

## 2018-03-09 NOTE — Progress Notes (Signed)
Chaplain responded to request for Advanced Directive.  Patient had already rec'd form and completed it.  Chaplain had notary and witnesses come to finalize it.  Will give AD to secretary to place in file. Lynnell Chad Pager [phone]

## 2018-03-09 NOTE — Care Management Note (Signed)
Case Management Note  Patient Details  Name: Jesse Price MRN: [id number] Date of Birth: [birth year]  Subjective/Objective:                    Action/Plan: Pt discharging to CIR today. CM signing off.    Expected Discharge Date:  03/09/18               Expected Discharge Plan:  IP Rehab Facility  In-House Referral:     Discharge planning Services  CM Consult  Post Acute Care Choice:    Choice offered to:     DME Arranged:    DME Agency:     HH Arranged:    HH Agency:     Status of Service:  Completed, signed off  If discussed at [employer], dates discussed:    Additional Comments:  Kermit Balo, RN 03/09/2018, 10:22 AM

## 2018-03-09 NOTE — H&P (Signed)
Physical Medicine and Rehabilitation Admission H&P    Chief Complaint  Patient presents with  . Extremity Weakness  : HPI: Jesse Price is a 52 year old right-handed male with history of hypertension, tobacco abuse, CKD stage II and alcohol use.  Per chart review and patient, patient lives with sister.  Independent prior to admission working full-time.  One level home with 3 steps to entry.  Sister works during the day.  Presented 03/05/2018 with left-sided weakness.  Cranial CT scan showed cytotoxic edema in the right mid frontal lobe extending into the adjacent anterior superior right centrum semi-ovale.  Patient did not receive TPA.  MRI of the brain reviewed, showing right frontal lobe infarction.  Per report, small acute early subacute infarction within the right posterior medial frontal lobe.  No associated hemorrhage.  Multiple small chronic infarcts within the bilateral frontal lobes, right parietal lobe and left posterior temporal lobe.  CT angiogram of head and neck with no dissection or aneurysm.  Echocardiogram with ejection fraction of 60% grade 1 diastolic dysfunction.  Neurology follow-up currently on aspirin for CVA prophylaxis.  Subcutaneous Lovenox for DVT prophylaxis.  TEE showed no evidence of thrombus ejection fraction 55% no wall motion abnormalities and loop recorder placed 03/08/2018 per cardiology services Dr. Ladona Ridgel.  Therapy evaluations completed with recommendations of physical medicine rehab consult.  Patient was admitted for a comprehensive rehab program.  Review of Systems  Constitutional: Negative for chills and fever.  HENT: Negative for hearing loss.   Eyes: Negative for blurred vision and double vision.  Respiratory: Negative for cough and shortness of breath.   Cardiovascular: Negative for chest pain and leg swelling.  Gastrointestinal: Positive for constipation. Negative for nausea and vomiting.  Genitourinary: Negative for dysuria, flank pain and  hematuria.  Musculoskeletal: Positive for myalgias.  Skin: Negative for rash.  Neurological: Positive for sensory change, speech change and focal weakness.  All other systems reviewed and are negative.  Past Medical History:  Diagnosis Date  . Asthma   . Hypertension    Past Surgical History:  Procedure Laterality Date  . LOOP RECORDER INSERTION N/A 03/08/2018   Procedure: LOOP RECORDER INSERTION;  Surgeon: Marinus Maw, MD;  Location: [facility] INVASIVE CV LAB;  Service: Cardiovascular;  Laterality: N/A;  . TEE WITHOUT CARDIOVERSION N/A 03/08/2018   Procedure: TRANSESOPHAGEAL ECHOCARDIOGRAM (TEE);  Surgeon: Jake Bathe, MD;  Location: [facility] ENDOSCOPY;  Service: Cardiovascular;  Laterality: N/A;   Family History  Problem Relation Age of Onset  . Cancer Mother        brain  . Other Father        died in fire  . Hypertension Sister   . Hypertension Brother   . Cancer Brother        back  . Hypertension Sister   . Hypertension Sister   . Heart disease Neg Hx   . Stroke Neg Hx    Social History:  reports that he has been smoking cigarettes. He has been smoking about 0.50 packs per day. He has never used smokeless tobacco. He reports that he drinks about 28.0 standard drinks of alcohol per week. He reports that he does not use drugs. Allergies: No Known Allergies Medications Prior to Admission  Medication Sig Dispense Refill  . amLODipine (NORVASC) 5 MG tablet TAKE 1 TABLET BY MOUTH ONCE DAILY (Patient taking differently: Take 5 mg by mouth daily. ) 30 tablet 0  . [START ON 03/10/2018] aspirin 325 MG tablet Take 1 tablet (325  mg total) by mouth daily. 30 tablet 0  . atorvastatin (LIPITOR) 80 MG tablet Take 1 tablet (80 mg total) by mouth daily at 6 PM. 30 tablet 0  . [START ON 03/10/2018] folic acid (FOLVITE) 1 MG tablet Take 1 tablet (1 mg total) by mouth daily. 30 tablet 0  . [START ON 03/10/2018] Multiple Vitamin (MULTIVITAMIN WITH MINERALS) TABS tablet Take 1 tablet by mouth  daily. 30 tablet 0  . [START ON 03/10/2018] thiamine 100 MG tablet Take 1 tablet (100 mg total) by mouth daily. 30 tablet 0    Drug Regimen Review Drug regimen was reviewed and remains appropriate with no significant issues identified  Home: Home Living Family/patient expects to be discharged to:: Private residence Living Arrangements: Other relatives(sister) Available Help at Discharge: Family, Available PRN/intermittently Type of Home: House Home Access: Stairs to enter Secretary/administrator of Steps: 3 Entrance Stairs-Rails: None Home Layout: One level Bathroom Shower/Tub: Engineer, manufacturing systems: Handicapped height Home Equipment: None  Lives With: Other (Comment)(Sister)   Functional History: Prior Function Level of Independence: Independent 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 required increased time and effort. Min guard for safety and stability Transfers Overall transfer level: Needs assistance Equipment used: 1 person hand held assist Transfers: Sit to/from Stand Sit to Stand: Min assist Stand pivot transfers: Min guard General transfer comment: Pt able to go from EOB to chair with min guard for safety and stability. 1+ HHA min assist required for power up from sit to stand from chair Ambulation/Gait Ambulation/Gait assistance: Mod assist Gait Distance (Feet): 20 Feet(x 3) Assistive device: 1 person hand held assist(Use of hallway railing for opposite UE) Gait Pattern/deviations: Step-to pattern, Decreased stride length, Decreased stance time - left, Trunk flexed General Gait Details: Pt ambulated in hallway using hand rail on the right for 20 ft x1. Mod assist required for weight shift initiation and stability  for first trial. Second and third trial pt utilized 1+ HHA on the right with mod physical assist on the right to promote weight shifting onto LLE. Pt required verbal cues for increased cadence, weight shifting and to keep posture upright. 20 feet x 3 with rest in chair inbetween.  Gait velocity: decreased Gait velocity interpretation: <1.31 ft/sec, indicative of household ambulator    ADL: ADL Overall ADL's : Needs assistance/impaired Eating/Feeding: NPO Grooming: Set up, Sitting Upper Body Bathing: Minimal assistance, Sitting Lower Body Bathing: Moderate assistance, Sit to/from stand Upper Body Dressing : Moderate assistance, Sitting Lower Body Dressing: Maximal assistance, Sit to/from stand Toilet Transfer: Moderate assistance, Stand-pivot(simulated to recliner ) Functional mobility during ADLs: Moderate assistance General ADL Comments: Pt eager and motivated.  Pt with decreased functional use and ROM of L UE/LE, requires R UE support in standing.   Cognition: Cognition Overall Cognitive Status: Within Functional Limits for tasks assessed Arousal/Alertness: Awake/alert Orientation Level: Oriented X4 Attention: Sustained, Selective Sustained Attention: Impaired Sustained Attention Impairment: Verbal complex Selective Attention: Impaired Selective Attention Impairment: Verbal basic, Functional basic Memory: Impaired Memory Impairment: Storage deficit(delayed recall 0/5, 2/5 with category cue) Awareness: Impaired Awareness Impairment: Intellectual impairment, Emergent impairment, Anticipatory impairment Problem Solving: Impaired Problem Solving Impairment: Verbal basic Executive Function: Organizing, Self Monitoring, Self Correcting Organizing: Impaired Organizing Impairment: Functional basic(clockdrawing impaired 0/3) Self Monitoring: Impaired Self Monitoring Impairment: Verbal  basic, Functional basic Self Correcting: Impaired Self Correcting Impairment: Verbal basic,  Functional basic Behaviors: Impulsive Safety/Judgment: Impaired Comments: decreased awareness of deficits, impulsivity Cognition Arousal/Alertness: Awake/alert Behavior During Therapy: WFL for tasks assessed/performed Overall Cognitive Status: Within Functional Limits for tasks assessed Area of Impairment: Memory, Attention, Following commands, Safety/judgement, Awareness, Problem solving Current Attention Level: Sustained Memory: Decreased recall of precautions, Decreased short-term memory Following Commands: Follows one step commands consistently, Follows one step commands with increased time Safety/Judgement: Decreased awareness of safety Awareness: Emergent Problem Solving: Slow processing, Decreased initiation, Difficulty sequencing, Requires verbal cues General Comments: continues to show some inattention to left  Physical Exam: Blood pressure 126/89, pulse 70, temperature 98.1 F (36.7 C), temperature source Oral, resp. rate 18, height 5\' 6"  (1.676 m), weight 74.8 kg, SpO2 100 %. Physical Exam  Vitals reviewed. Constitutional: He is oriented to person, place, and time. He appears well-developed and well-nourished.  HENT:  Head: Normocephalic and atraumatic.  Eyes: EOM are normal. Right eye exhibits no discharge. Left eye exhibits no discharge.  Neck: Normal range of motion. Neck supple. No thyromegaly present.  Cardiovascular: Normal rate, regular rhythm and normal heart sounds.  Respiratory: Effort normal and breath sounds normal. No respiratory distress.  GI: Soft. Bowel sounds are normal. He exhibits no distension.  Musculoskeletal:  No edema or tenderness in extremities  Neurological: He is alert and oriented to person, place, and time.  Makes good eye contact.   Fair awareness of deficit.   Follows simple commands Motor: RUE/RLE: 5/5 proximal to distal LUE: 4-/5 proximal to distal LLE: 4+/5 proximal to distal Sensation diminished to light touch left hand Dysarthria   Skin: Skin is warm and dry.  Psychiatric: His behavior is normal. His affect is blunt.    No results found for this or any previous visit (from the past 48 hour(s)). No results found.     Medical Problem List and Plan: 1.  Left-sided weakness secondary to right frontal lobe infarction.  Status post loop recorder 2.  DVT Prophylaxis/Anticoagulation: Subcutaneous Lovenox.  Monitor for any bleeding episodes 3. Pain Management: Tylenol as needed 4. Mood: Provide emotional support 5. Neuropsych: This patient is capable of making decisions on his own behalf. 6. Skin/Wound Care: Routine skin checks 7. Fluids/Electrolytes/Nutrition: Routine in and outs with follow-up chemistries 8.  Hypertension.  Permissive hypertension (OK if < 220/120) resume home dose of Norvasc 5 mg daily as needed 9.  Hyperlipidemia.  Lipitor 10.  CKD stage II.  Follow-up chemistries 11.  Tobacco and alcohol use.  Provide counseling    Post Admission Physician Evaluation: 1. Preadmission assessment reviewed and changes made below. 2. Functional deficits secondary  to right frontal lobe infarction. 3. Patient is admitted to receive collaborative, interdisciplinary care between the physiatrist, rehab nursing staff, and therapy team. 4. Patient's level of medical complexity and substantial therapy needs in context of that medical necessity cannot be provided at a lesser intensity of care such as a SNF. 5. Patient has experienced substantial functional loss from his/her baseline which was documented above under the "Functional History" and "Functional Status" headings.  Judging by the patient's diagnosis, physical exam, and functional history, the patient has potential for functional progress which will result in measurable gains while on inpatient rehab.  These gains will be of substantial and practical use upon discharge  in facilitating mobility and self-care at the household level. 6. Physiatrist will provide 24 hour  management of medical needs as well as oversight of the therapy plan/treatment  and provide guidance as appropriate regarding the interaction of the two. 7. 24 hour rehab nursing will assist with safety, disease management and patient education  and help integrate therapy concepts, techniques,education, etc. 8. PT will assess and treat for/with: Lower extremity strength, range of motion, stamina, balance, functional mobility, safety, adaptive techniques and equipment, coping skills, pain control, education. Goals are: Supervision. 9. OT will assess and treat for/with: ADL's, functional mobility, safety, upper extremity strength, adaptive techniques and equipment, ego support, and community reintegration.   Goals are: Supervision. Therapy may proceed with showering this patient. 10. Case Management and Social Worker will assess and treat for psychological issues and discharge planning. 11. Team conference will be held weekly to assess progress toward goals and to determine barriers to discharge. 12. Patient will receive at least 3 hours of therapy per day at least 5 days per week. 13. ELOS: 12-15 days.       14. Prognosis:  good  I have personally performed a face to face diagnostic evaluation, including, but not limited to relevant history and physical exam findings, of this patient and developed relevant assessment and plan.  Additionally, I have reviewed and concur with the physician assistant's documentation above.  The patient's status has not changed. The original post admission physician evaluation remains appropriate, and any changes from the pre-admission screening or documentation from the acute chart are noted above.    Maryla Morrow, MD, ABPMR Mcarthur Rossetti Angiulli, PA-C 03/09/2018

## 2018-03-09 NOTE — Progress Notes (Signed)
Admission Note: A/O, no noted distress. Denies pain. Pt notes, "does not like to lay in the bed." He works as a Psychologist, occupational and he read blueprints. Pt ambulated to bathroom with rolling walker. Cont B/B, LBM 03/07/18. Educated on to use call light system of assistance and not to get without a member staff at bedside. Skin intact, no skin issues. Pt wears glasses. Cell phone at bedside (black/ [employer]).

## 2018-03-09 NOTE — Progress Notes (Signed)
Pt transported to 4W18 via wheelchair. All belongings sent with patient.

## 2018-03-09 NOTE — Progress Notes (Signed)
PMR Admission Coordinator Pre-Admission Assessment  Patient: Jesse Price is an 52 y.o., male MRN: [id number] DOB: May 07, 1966 Height: 5' 6"  (167.6 cm) Weight: 74.8 kg                                                                                                                                                  Insurance Information HMO:     PPO: Yes     PCP:      IPA:      80/20:      OTHER:  PRIMARY: [employer]      Policy#: [id number]      Subscriber: Patient CM Name: Lattie Haw      Phone#: [phone]     Fax#: [phone] Pre-Cert#: [id number]      Employer:  Josem Kaufmann provided by Lattie Haw [phone]) at [facility] in late evening on 03/08/18 for admit date 03/09/18. Pt is approved for 7 days with updates due on 03/16/18.  Benefits:  Phone #: NA     Name: [facility] Provider Portal Eff. Date: 05/11/17     Deduct: $2,400 (met $0)      Out of Pocket Max: $4,400 (Met $0)      Life Max: NA CIR: 70%/30%      SNF: 70%/30% 60 day limit Outpatient: 40/combined PT & OT, 20/ST     Co-Pay: $20/visit Home Health: 70%      Co-Pay: 30% DME: 70%     Co-Pay: 30% Providers:  SECONDARY:       Policy#:       Subscriber:  CM Name:       Phone#:      Fax#:  Pre-Cert#:       Employer:  Benefits:  Phone #:      Name:  Eff. Date:      Deduct:       Out of Pocket Max:       Life Max:  CIR:       SNF:  Outpatient:      Co-Pay:  Home Health:       Co-Pay:  DME:      Co-Pay:  Medicaid Application Date:       Case Manager:  Disability Application Date:       Case Worker:  Emergency Contact Information         Contact Information    Name Relation Home Work Mobile   [place] Sister [phone]       Current Medical History  Patient Admitting Diagnosis: Infarction within the right posterior medial frontal lobe History of Present Illness: Jesse Price is a 51 year old right-handed male with history of hypertension, tobacco abuse, CKD stage II and alcohol use. Per chart review and patient, patient lives with  sister. Independent prior to admission working full-time. One level home with 3 steps to entry. Sister works during the day.  Presented 03/05/2018 with left-sided weakness. Cranial CT scan showed cytotoxic edema in the right mid frontal lobe extending into the adjacent anterior superior right centrum semi-ovale. Patient did not receive TPA. MRI of the brain reviewed, showing right frontal lobe infarction. Per report, small acute early subacute infarction within the right posterior medial frontal lobe. No associated hemorrhage. Multiple small chronic infarcts within the bilateral frontal lobes, right parietal lobe and left posterior temporal lobe. CT angiogram of head and neck with no dissection or aneurysm. Echocardiogram with ejection fraction of 99% grade 1 diastolic dysfunction. Neurology follow-up currently on aspirin for CVA prophylaxis. Subcutaneous Lovenox for DVT prophylaxis. TEE and loop recorder 03/08/2018. Therapy evaluations completed with recommendations of physical medicine rehab consult. Patient is to be admitted for a comprehensive rehab program on 03/09/18.   Complete NIHSS TOTAL: 4  Past Medical History      Past Medical History:  Diagnosis Date  . Asthma   . Hypertension     Family History  family history includes Cancer in his brother and mother; Hypertension in his brother, sister, sister, and sister; Other in his father.  Prior Rehab/Hospitalizations:  Has the patient had major surgery during 100 days prior to admission? No  Current Medications   Current Facility-Administered Medications:  .  acetaminophen (TYLENOL) tablet 325-650 mg, 325-650 mg, Oral, Q4H PRN, Evans Lance, MD .  aspirin suppository 300 mg, 300 mg, Rectal, Daily **OR** aspirin tablet 325 mg, 325 mg, Oral, Daily, Evans Lance, MD, 325 mg at 03/07/18 1004 .  atorvastatin (LIPITOR) tablet 80 mg, 80 mg, Oral, q1800, Evans Lance, MD, 80 mg at 03/08/18 1825 .  enoxaparin  (LOVENOX) injection 40 mg, 40 mg, Subcutaneous, Daily, Evans Lance, MD .  folic acid (FOLVITE) tablet 1 mg, 1 mg, Oral, Daily, Evans Lance, MD, 1 mg at 03/07/18 1004 .  labetalol (NORMODYNE,TRANDATE) injection 5-10 mg, 5-10 mg, Intravenous, Q2H PRN, Evans Lance, MD .  [EXPIRED] LORazepam (ATIVAN) injection 0-4 mg, 0-4 mg, Intravenous, Q6H, 2 mg at 03/06/18 2315 **FOLLOWED BY** LORazepam (ATIVAN) injection 0-4 mg, 0-4 mg, Intravenous, Q12H, Evans Lance, MD .  multivitamin with minerals tablet 1 tablet, 1 tablet, Oral, Daily, Evans Lance, MD, 1 tablet at 03/07/18 1004 .  ondansetron (ZOFRAN) injection 4 mg, 4 mg, Intravenous, Q6H PRN, Evans Lance, MD .  senna-docusate (Senokot-S) tablet 1 tablet, 1 tablet, Oral, QHS PRN, Evans Lance, MD .  thiamine (VITAMIN B-1) tablet 100 mg, 100 mg, Oral, Daily, 100 mg at 03/07/18 1004 **OR** thiamine (B-1) injection 100 mg, 100 mg, Intravenous, Daily, Evans Lance, MD  Patients Current Diet:     Diet Order                  Diet Heart Room service appropriate? Yes; Fluid consistency: Thin  Diet effective now               Precautions / Restrictions Precautions Precautions: Fall Restrictions Weight Bearing Restrictions: No   Has the patient had 2 or more falls or a fall with injury in the past year?No  Prior Activity Level Community (5-7x/wk): active, worked full time as Building control surveyor; Administrator, Civil Service; Drove Radiographer, therapeutic / Publishing rights manager: None  Prior Device Use: Indicate devices/aids used by the patient prior to current illness, exacerbation or injury? None of the above  Prior Functional Level Prior Function Level of Independence: Independent Comments: driving and working Management consultant)   Self  Care: Did the patient need help bathing, dressing, using the toilet or eating?  Independent  Indoor Mobility: Did the patient need assistance with walking from room to room (with or  without device)? Independent  Stairs: Did the patient need assistance with internal or external stairs (with or without device)? Independent  Functional Cognition: Did the patient need help planning regular tasks such as shopping or remembering to take medications? Independent  Current Functional Level Cognition  Arousal/Alertness: Awake/alert Overall Cognitive Status: Within Functional Limits for tasks assessed Current Attention Level: Sustained Orientation Level: Oriented X4 Following Commands: Follows one step commands consistently, Follows one step commands with increased time Safety/Judgement: Decreased awareness of safety General Comments: continues to show some inattention to left Attention: Sustained, Selective Sustained Attention: Impaired Sustained Attention Impairment: Verbal complex Selective Attention: Impaired Selective Attention Impairment: Verbal basic, Functional basic Memory: Impaired Memory Impairment: Storage deficit(delayed recall 0/5, 2/5 with category cue) Awareness: Impaired Awareness Impairment: Intellectual impairment, Emergent impairment, Anticipatory impairment Problem Solving: Impaired Problem Solving Impairment: Verbal basic Executive Function: Organizing, Self Monitoring, Self Correcting Organizing: Impaired Organizing Impairment: Functional basic(clockdrawing impaired 0/3) Self Monitoring: Impaired Self Monitoring Impairment: Verbal basic, Functional basic Self Correcting: Impaired Self Correcting Impairment: Verbal basic, Functional basic Behaviors: Impulsive Safety/Judgment: Impaired Comments: decreased awareness of deficits, impulsivity    Extremity Assessment (includes Sensation/Coordination)  Upper Extremity Assessment: Defer to OT evaluation LUE Deficits / Details: Hemiparetic UE: grossly MMT 3-/5 shoulder FF, elbow flexion, finger extension;  decreased functional use and strength, unable to initate grasp consistently LUE Sensation:  WNL LUE Coordination: decreased fine motor, decreased gross motor  Lower Extremity Assessment: LLE deficits/detail LLE Deficits / Details: L LE grossly 3-/5 with hip flexion most limited LLE Sensation: WNL LLE Coordination: decreased fine motor, decreased gross motor    ADLs  Overall ADL's : Needs assistance/impaired Eating/Feeding: NPO Grooming: Set up, Sitting Upper Body Bathing: Minimal assistance, Sitting Lower Body Bathing: Moderate assistance, Sit to/from stand Upper Body Dressing : Moderate assistance, Sitting Lower Body Dressing: Maximal assistance, Sit to/from stand Toilet Transfer: Moderate assistance, Stand-pivot(simulated to recliner ) Functional mobility during ADLs: Moderate assistance General ADL Comments: Pt eager and motivated.  Pt with decreased functional use and ROM of L UE/LE, requires R UE support in standing.     Mobility  Overal bed mobility: Needs Assistance Bed Mobility: Supine to Sit Supine to sit: HOB elevated, Min guard General bed mobility comments: Pt went supine to sit without physical assistance. required increased time and effort. Min guard for safety and stability    Transfers  Overall transfer level: Needs assistance Equipment used: 1 person hand held assist Transfers: Sit to/from Stand Sit to Stand: Min assist Stand pivot transfers: Min guard General transfer comment: Pt able to go from EOB to chair with min guard for safety and stability. 1+ HHA min assist required for power up from sit to stand from chair    Ambulation / Gait / Stairs / Wheelchair Mobility  Ambulation/Gait Ambulation/Gait assistance: Mod assist Gait Distance (Feet): 20 Feet(x 3) Assistive device: 1 person hand held assist(Use of hallway railing for opposite UE) Gait Pattern/deviations: Step-to pattern, Decreased stride length, Decreased stance time - left, Trunk flexed General Gait Details: Pt ambulated in hallway using hand rail on the right for 20 ft x1. Mod  assist required for weight shift initiation and stability for first trial. Second and third trial pt utilized 1+ HHA on the right with mod physical assist on the right to promote weight shifting onto LLE.  Pt required verbal cues for increased cadence, weight shifting and to keep posture upright. 20 feet x 3 with rest in chair inbetween.  Gait velocity: decreased Gait velocity interpretation: <1.31 ft/sec, indicative of household ambulator    Posture / Balance Balance Overall balance assessment: Needs assistance Sitting-balance support: No upper extremity supported, Feet supported Sitting balance-Leahy Scale: Fair Standing balance support: Single extremity supported, During functional activity Standing balance-Leahy Scale: Poor Standing balance comment: reliant on RUE support    Special needs/care consideration BiPAP/CPAP: no CPM: no Continuous Drip IV: no Dialysis: no        Days: NA Life Vest: no Oxygen: no Special Bed: no Trach Size: no Wound Vac (area): no      Location: NA Skin: no areas of concern                         Bowel mgmt: Last BM: 03/06/18 Bladder mgmt: continent, use of urinal  Diabetic mgmt: no     Previous Home Environment Living Arrangements: Other relatives(sister)  Lives With: Other (Comment)(Sister) Available Help at Discharge: Family, Available PRN/intermittently Type of Home: House Home Layout: One level Home Access: Stairs to enter Entrance Stairs-Rails: None Entrance Stairs-Number of Steps: 3 Bathroom Shower/Tub: Chiropodist: Handicapped height  Discharge Living Setting Plans for Discharge Living Setting: Patient's home, Lives with (comment)(lives with sister, EULA) Type of Home at Discharge: House Discharge Home Layout: One level Discharge Home Access: Stairs to enter Entrance Stairs-Rails: None Entrance Stairs-Number of Steps: 3 Discharge Bathroom Shower/Tub: Tub/shower unit Discharge Bathroom Toilet:  Standard Discharge Bathroom Accessibility: Yes How Accessible: Accessible via walker Does the patient have any problems obtaining your medications?: No  Social/Family/Support Systems 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 Information: Gaffer (sister)  Anticipated Caregiver: Melene Muller (sister)  Anticipated Caregiver's Contact Information: cell: [phone] Ability/Limitations of Caregiver: Intermittant supervision (works Games developer, Wed, Thursday 8am-4:30pm Caregiver Availability: Intermittent Discharge Plan Discussed with Primary Caregiver: Yes Is Caregiver In Agreement with Plan?: Yes Does Caregiver/Family have Issues with Lodging/Transportation while Pt is in Rehab?: No   Goals/Additional Needs Patient/Family Goal for Rehab: PT/OT: Mod I/Supervision; SLP: NA Expected length of stay: 8-11 days Cultural Considerations: NA Dietary Needs: Heart Healthy; thin liquids Equipment Needs: TBD Pt/Family Agrees to Admission and willing to participate: Yes Program Orientation Provided & Reviewed with Pt/Caregiver Including Roles  & Responsibilities: Yes(with sister and patient)  Barriers to Discharge: Decreased caregiver support, Home environment access/layout  Barriers to Discharge Comments: 3 steps to enter; tub shower; intermittant supervision   Decrease burden of Care through IP rehab admission: NA   Possible need for SNF placement upon discharge:Not anticipated; Pt has good prognosis for further progress.    Patient Condition: This patient's medical and functional status has changed since the consult dated: 03/07/18 in which the Rehabilitation Physician determined and documented that the patient's condition is appropriate for intensive rehabilitative care in an inpatient rehabilitation facility. Medical changes are: pt underwent TEE and Loop inplantation; Tee did not show any thrombus and no PFO.  Functional changes are: progression in gait training; Pt has remained at [place]  [facility] for transfers and progressed from Mod A 15 feet to Mod A 20 feet for ambulation . After evaluating the patient today and speaking with the Rehabilitation physician and acute team, the patient remains appropriate for inpatient rehab. Will admit to inpatient rehab today.  Preadmission Screen Completed By:  Jhonnie Garner, 03/09/2018 7:32 AM ______________________________________________________________________   Discussed status with  Dr. Posey Pronto on 03/09/18 at 9:00AM and received telephone approval for admission today.  Admission Coordinator:  Jhonnie Garner, time 9:00AM/Date 03/09/18           Cosigned by: Jamse Arn, MD at 03/09/2018 9:12 AM  Revision History

## 2018-03-09 NOTE — Progress Notes (Signed)
   03/09/18 1000  Clinical Encounter Type  Visited With Patient  Visit Type Initial  Referral From Chaplain  Spiritual Encounters  Spiritual Needs Emotional  Stress Factors  Patient Stress Factors Major life changes  Advance Directives (For Healthcare)  Does Patient Have a Medical Advance Directive? Yes

## 2018-03-10 ENCOUNTER — Inpatient Hospital Stay (HOSPITAL_COMMUNITY): Payer: Commercial Managed Care - PPO

## 2018-03-10 ENCOUNTER — Inpatient Hospital Stay (HOSPITAL_COMMUNITY): Payer: Commercial Managed Care - PPO | Admitting: Physical Therapy

## 2018-03-10 DIAGNOSIS — N182 Chronic kidney disease, stage 2 (mild): Secondary | ICD-10-CM

## 2018-03-10 DIAGNOSIS — R269 Unspecified abnormalities of gait and mobility: Secondary | ICD-10-CM

## 2018-03-10 DIAGNOSIS — G8114 Spastic hemiplegia affecting left nondominant side: Secondary | ICD-10-CM

## 2018-03-10 DIAGNOSIS — I639 Cerebral infarction, unspecified: Secondary | ICD-10-CM

## 2018-03-10 DIAGNOSIS — I6310 Cerebral infarction due to embolism of unspecified precerebral artery: Secondary | ICD-10-CM

## 2018-03-10 DIAGNOSIS — I69398 Other sequelae of cerebral infarction: Secondary | ICD-10-CM

## 2018-03-10 LAB — CBC WITH DIFFERENTIAL/PLATELET
Abs Immature Granulocytes: 0.03 10*3/uL (ref 0.00–0.07)
Basophils Absolute: 0.1 10*3/uL (ref 0.0–0.1)
Basophils Relative: 1 %
EOS ABS: 0.4 10*3/uL (ref 0.0–0.5)
Eosinophils Relative: 4 %
HEMATOCRIT: 45 % (ref 39.0–52.0)
HEMOGLOBIN: 15 g/dL (ref 13.0–17.0)
IMMATURE GRANULOCYTES: 0 %
LYMPHS ABS: 2.5 10*3/uL (ref 0.7–4.0)
LYMPHS PCT: 25 %
MCH: 30.5 pg (ref 26.0–34.0)
MCHC: 33.3 g/dL (ref 30.0–36.0)
MCV: 91.5 fL (ref 80.0–100.0)
MONOS PCT: 11 %
Monocytes Absolute: 1.1 10*3/uL — ABNORMAL HIGH (ref 0.1–1.0)
Neutro Abs: 6.2 10*3/uL (ref 1.7–7.7)
Neutrophils Relative %: 59 %
Platelets: 346 10*3/uL (ref 150–400)
RBC: 4.92 MIL/uL (ref 4.22–5.81)
RDW: 12.6 % (ref 11.5–15.5)
WBC: 10.3 10*3/uL (ref 4.0–10.5)
nRBC: 0 % (ref 0.0–0.2)

## 2018-03-10 LAB — COMPREHENSIVE METABOLIC PANEL
ALBUMIN: 4 g/dL (ref 3.5–5.0)
ALK PHOS: 54 U/L (ref 38–126)
ALT: 15 U/L (ref 0–44)
AST: 31 U/L (ref 15–41)
Anion gap: 5 (ref 5–15)
BILIRUBIN TOTAL: 0.5 mg/dL (ref 0.3–1.2)
BUN: 18 mg/dL (ref 6–20)
CALCIUM: 9.5 mg/dL (ref 8.9–10.3)
CO2: 27 mmol/L (ref 22–32)
Chloride: 104 mmol/L (ref 98–111)
Creatinine, Ser: 1.29 mg/dL — ABNORMAL HIGH (ref 0.61–1.24)
GFR calc Af Amer: >60 mL/min (ref >60)
GLUCOSE: 99 mg/dL (ref 70–99)
POTASSIUM: 4.4 mmol/L (ref 3.5–5.1)
Sodium: 136 mmol/L (ref 135–145)
TOTAL PROTEIN: 6.7 g/dL (ref 6.5–8.1)

## 2018-03-10 NOTE — Progress Notes (Signed)
Social Work  Assessment and Plan  Patient Details  Name: Jesse Price MRN: [id number] Date of Birth: 10-17-1965  Today's Date: 03/10/2018  Problem List:  Patient Active Problem List   Diagnosis Date Noted  . Embolic cerebral infarction ([place]) 03/09/2018  . Dyslipidemia   . Cerebrovascular accident (CVA) ([place])   . Left-sided weakness   . Benign essential HTN   . Tobacco abuse   . Diastolic dysfunction   . Leukocytosis   . Acute ischemic stroke ([place]) 03/05/2018  . CKD (chronic kidney disease), stage II 03/05/2018  . Alcohol dependence ([place]) 03/05/2018  . Essential hypertension 03/17/2017  . Abnormal kidney function 03/17/2017  . Hyperkalemia 03/17/2017  . Sleep disturbance 03/17/2017  . Sleep walking 03/17/2017  . Sebaceous cyst 03/17/2017  . Laceration of nose 03/17/2017   Past Medical History:  Past Medical History:  Diagnosis Date  . Asthma   . Hypertension    Past Surgical History:  Past Surgical History:  Procedure Laterality Date  . LOOP RECORDER INSERTION N/A 03/08/2018   Procedure: LOOP RECORDER INSERTION;  Surgeon: Evans Lance, MD;  Location: [place] [facility];  Service: Cardiovascular;  Laterality: N/A;  . TEE WITHOUT CARDIOVERSION N/A 03/08/2018   Procedure: TRANSESOPHAGEAL ECHOCARDIOGRAM (TEE);  Surgeon: Jerline Pain, MD;  Location: [facility];  Service: Cardiovascular;  Laterality: N/A;   Social History:  reports that he has been smoking cigarettes. He has been smoking about 0.50 packs per day. He has never used smokeless tobacco. He reports that he drinks about 28.0 standard drinks of alcohol per week. He reports that he does not use drugs.  Family / Support Systems Marital Status: Divorced Patient Roles: Other (Comment)(father; brother; friend; Chartered certified accountant) Children: Pt has a 41 y/o dtr in [place] and 46 y/o son in [place] Other Supports: Daneen Schick - sister - [phone] Anticipated Caregiver: Melene Muller (sister)  Ability/Limitations of  Caregiver: Intermittant supervision (works Games developer, Wed, Thursday 8am-4:30pm Caregiver Availability: Intermittent Family Dynamics: close, supportive family and friends  Social History Preferred language: English Religion:  Read: Yes Write: Yes Employment Status: Employed 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 Legal History/Current Legal Issues: none reported Guardian/Conservator: N/A - MD has determined that pt is capable of making his own decisions.   Abuse/Neglect Abuse/Neglect Assessment Can Be Completed: Yes Physical Abuse: Denies Verbal Abuse: Denies Sexual Abuse: Denies Exploitation of patient/patient's resources: Denies Self-Neglect: Denies  Emotional Status Pt's affect, behavior and adjustment status: Pt admits to having some tears regarding his recent stroke, but overall remains positive and motivated to get better. Recent Psychosocial Issues: none reported Psychiatric History: none reported Substance Abuse History: none reported  Patient / Family Perceptions, Expectations & Goals Pt/Family understanding of illness & functional limitations: Pt reports a good understanding of his condition.  He plans to stop smoking to help prevent any further medical events. Premorbid pt/family roles/activities: Pt works 5 to 7 days (40-50+ hours) a week.  Watches TV/sports. Anticipated changes in roles/activities/participation: Pt hopes to resume activities as he is able. Pt/family expectations/goals: Pt wants to get home as soon as possible and regain use of his left (dominant) hand.  Community Resources Express Scripts: None Premorbid Home Care/DME Agencies: None Transportation available at discharge: sister Resource referrals recommended: Neuropsychology, Support group (specify)(stroke support group)  Discharge Planning Living Arrangements: Other relatives(sister) Support Systems:  Other relatives, Friends/neighbors Type of Residence: Private residence [employer]  Resources: Multimedia programmer (specify)([facility]) Financial Resources: Employment, Secondary school teacher Screen Referred: No Living Expenses: Lives with family(sister's home) Money Management: Patient Does the patient have any problems obtaining your medications?: No Home Management: Pt and sister share responsibilites. Patient/Family Preliminary Plans: Pt plans to return to his sister's home where he lived PTA. Social Work Anticipated Follow Up Needs: HH/OP, Support Group Expected length of stay: 5 to 7 days  Clinical Impression CSW met with pt to introduce self and role of CSW, as well as to complete assessment.  Pt is appreciative of opportunity to come to CIR, but wants to get home as soon as possible.  He has good support from sister and friends, but had independent goals overall.  Pt is motivated to get better and wants to get back to work.  Pt did not have any current needs/concerns/questions.  CSW will continue to follow and assist as needed.  Hermila Millis, Silvestre Mesi 03/10/2018, 1:46 PM

## 2018-03-10 NOTE — Evaluation (Signed)
Occupational Therapy Assessment and Plan  Patient Details  Name: Jesse Price MRN: [id number] Date of Birth: April 07, 1966  OT Diagnosis: hemiplegia affecting dominant side Rehab Potential: Rehab Potential (ACUTE ONLY): Good ELOS: 5-7 days   Today's Date: 03/10/2018 OT Individual Time: 6644-0347 OT Individual Time Calculation (min): 75 min     Problem List:  Patient Active Problem List   Diagnosis Date Noted  . Embolic cerebral infarction ([place]) 03/09/2018  . Dyslipidemia   . Cerebrovascular accident (CVA) ([place])   . Left-sided weakness   . Benign essential HTN   . Tobacco abuse   . Diastolic dysfunction   . Leukocytosis   . Acute ischemic stroke ([place]) 03/05/2018  . CKD (chronic kidney disease), stage II 03/05/2018  . Alcohol dependence ([place]) 03/05/2018  . Essential hypertension 03/17/2017  . Abnormal kidney function 03/17/2017  . Hyperkalemia 03/17/2017  . Sleep disturbance 03/17/2017  . Sleep walking 03/17/2017  . Sebaceous cyst 03/17/2017  . Laceration of nose 03/17/2017    Past Medical History:  Past Medical History:  Diagnosis Date  . Asthma   . Hypertension    Past Surgical History:  Past Surgical History:  Procedure Laterality Date  . LOOP RECORDER INSERTION N/A 03/08/2018   Procedure: LOOP RECORDER INSERTION;  Surgeon: Evans Lance, MD;  Location: [facility];  Service: Cardiovascular;  Laterality: N/A;  . TEE WITHOUT CARDIOVERSION N/A 03/08/2018   Procedure: TRANSESOPHAGEAL ECHOCARDIOGRAM (TEE);  Surgeon: Jerline Pain, MD;  Location: [facility] END[facility]COPY;  Service: Cardiovascular;  Laterality: N/A;    Assessment & Plan Clinical Impression: Jesse Price is a 52 year old right-handed male with history of hypertension, tobacco abuse, CKD stage II and alcohol use.  Per chart review and patient, patient lives with sister.  Independent prior to admission working full-time.  One level home with 3 steps to entry.  Sister works during the day.  Presented  03/05/2018 with left-sided weakness.  Cranial CT scan showed cytotoxic edema in the right mid frontal lobe extending into the adjacent anterior superior right centrum semi-ovale.  Patient did not receive TPA.  MRI of the brain reviewed, showing right frontal lobe infarction.  Per report, small acute early subacute infarction within the right posterior medial frontal lobe.  No associated hemorrhage.  Multiple small chronic infarcts within the bilateral frontal lobes, right parietal lobe and left posterior temporal lobe.  CT angiogram of head and neck with no dissection or aneurysm.  Echocardiogram with ejection fraction of 42% grade 1 diastolic dysfunction.  Neurology follow-up currently on aspirin for CVA prophylaxis.  Subcutaneous Lovenox for DVT prophylaxis.  TEE showed no evidence of thrombus ejection fraction 55% no wall motion abnormalities and loop recorder placed 03/08/2018 per cardiology services Dr. Lovena Le.  Therapy evaluations completed with recommendations of physical medicine rehab consult.  Patient was admitted for a comprehensive rehab program.  Patient transferred to CIR on 03/09/2018 .    Patient currently requires min with basic self-care skills secondary to muscle weakness, abnormal tone, unbalanced muscle activation and decreased coordination, decreased visual acuity and decreased standing balance and decreased postural control.  Prior to hospitalization, patient could complete ADLs with independent .  Patient will benefit from skilled intervention to decrease level of assist with basic self-care skills and increase level of independence with iADL prior to discharge home with care partner.  Anticipate patient will require No supervision and no further OT follow recommended.  OT - End of Session Activity Tolerance: Tolerates 30+ min activity with multiple rests Endurance Deficit: Yes  Endurance Deficit Description: generalized weakness OT Assessment Rehab Potential (ACUTE ONLY): Good OT  Patient demonstrates impairments in the following area(s): Balance;Vision;Safety;Endurance;Motor OT Basic ADL's Functional Problem(s): Eating;Grooming;Bathing;Dressing;Toileting OT Transfers Functional Problem(s): Toilet;Tub/Shower OT Additional Impairment(s): Fuctional Use of Upper Extremity OT Plan OT Intensity: Minimum of 1-2 x/day, 45 to 90 minutes OT Frequency: 5 out of 7 days OT Duration/Estimated Length of Stay: 5-7 days OT Treatment/Interventions: Balance/vestibular training;Community reintegration;Disease mangement/prevention;Patient/family education;Self Care/advanced ADL retraining;UE/LE Coordination activities;Therapeutic Exercise;Discharge planning;DME/adaptive equipment instruction;Functional mobility training;Psychosocial support;Pain management;Skin care/wound managment;Therapeutic Activities;UE/LE Strength taining/ROM;Visual/perceptual remediation/compensation OT Self Feeding Anticipated Outcome(s): mod I OT Basic Self-Care Anticipated Outcome(s): mod I OT Toileting Anticipated Outcome(s): mod I OT Bathroom Transfers Anticipated Outcome(s): mod I OT Recommendation Recommendations for Other Services: Neuropsych consult;Speech consult Patient destination: Home Follow Up Recommendations: None Equipment Recommended: To be determined   Skilled Therapeutic Intervention Pt seen for skilled OT evaluation. Discussed/edu pt re OT POC, ELOS, goal setting, and rehab expectations throughout session. Pt completed bed mobility at (S) level. Min cueing for mild impulsivity. Min cueing for use of DME in shower and forced use of L UE. Pt edu re CVA recovery and condition insight. Min cueing/demo provided for hemi dressing techniques UB and LB. Pt returned to supine in bed and was left with bed alarm set and all needs met.   OT Evaluation Precautions/Restrictions  Precautions Precautions: Fall Restrictions Weight Bearing Restrictions: No General Chart Reviewed: Yes PT Missed Treatment  Reason: Unavailable (Comment)(SW with patient filling out paperwork) Family/Caregiver Present: No  Pain Pain Assessment Pain Scale: 0-10 Pain Score: 0-No pain Pain Type: Acute pain Pain Location: Arm Pain Orientation: Left Pain Descriptors / Indicators: Sore Pain Onset: On-going Pain Intervention(s): Therapeutic touch Home Living/Prior Functioning Home Living Family/patient expects to be discharged to:: Private residence Living Arrangements: Other relatives(sister) Available Help at Discharge: Family, Available PRN/intermittently(sister works during the day) Type of Home: House Home Access: Stairs to enter Technical brewer of Steps: 3 Entrance Stairs-Rails: None Home Layout: One level Bathroom Shower/Tub: Chiropodist: Handicapped height Bathroom Accessibility: Yes  Lives With: Other (Comment)(sister) IADL History Homemaking Responsibilities: Yes Meal Prep Responsibility: Primary Laundry Responsibility: Secondary Cleaning Responsibility: Primary Bill Paying/Finance Responsibility: Primary Shopping Responsibility: Primary Child Care Responsibility: No Current License: Yes Mode of Transportation: Car Occupation: Full time employment Type of Occupation: Building control surveyor Prior Function Level of Independence: Independent with basic ADLs, Independent with transfers, Independent with gait, Independent with homemaking with ambulation  Able to Take Stairs?: Yes Driving: Yes 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)  ADL ADL Grooming: Minimal cueing, Contact guard Where Assessed-Grooming: Edge of bed Upper Body Bathing: Contact guard Where Assessed-Upper Body Bathing: Shower Lower Body Bathing: Contact guard Where Assessed-Lower Body Bathing: Shower Upper Body Dressing: Minimal assistance, Minimal cueing Where Assessed-Upper Body Dressing: Edge of bed Lower Body Dressing: Minimal assistance, Minimal cueing Where  Assessed-Lower Body Dressing: Edge of bed Toileting: Minimal assistance Where Assessed-Toileting: Glass blower/designer: Psychiatric nurse Method: Counselling psychologist: Emergency planning/management officer Transfer: Environmental education officer Method: Heritage manager: Civil engineer, contracting with back Vision Baseline Vision/History: Wears glasses Wears Glasses: At all times Patient Visual Report: Blurring of vision(blurring of vision comes and goes) Vision Assessment?: Yes Eye Alignment: Within Functional Limits Ocular Range of Motion: Within Functional Limits Alignment/Gaze Preference: Within Defined Limits Tracking/Visual Pursuits: Decreased smoothness of vertical tracking;Decreased smoothness of horizontal tracking Saccades: Decreased speed of saccadic movement Convergence: Within functional limits Diplopia Assessment: Other (comment)(present  in 3 inch) Perception  Perception: Impaired Inattention/Neglect: Does not attend to left side of body Praxis Praxis: Impaired Cognition Overall Cognitive Status: Within Functional Limits for tasks assessed Arousal/Alertness: Awake/alert Orientation Level: Person;Place;Situation Person: Oriented Place: Oriented Situation: Oriented Year: 2019 Month: October Day of Week: Correct Memory: Appears intact Immediate Memory Recall: Sock;Blue;Bed Memory Recall: Sock;Blue;Bed Memory Recall Sock: With Cue Memory Recall Blue: Without Cue Memory Recall Bed: Without Cue Attention: Selective Sustained Attention: Appears intact Selective Attention: Impaired Selective Attention Impairment: Functional complex;Verbal complex Awareness Impairment: Anticipatory impairment Problem Solving: Impaired Problem Solving Impairment: Functional complex;Verbal complex Behaviors: Impulsive Safety/Judgment: Impaired Comments: decreased awareness of deficits, impulsivity Sensation Sensation Light Touch: Appears  Intact Central sensation comments: Intact by assessment but pt reports pins and needles on L side Hot/Cold: Appears Intact Proprioception: Impaired by gross assessment Coordination Gross Motor Movements are Fluid and Coordinated: No Fine Motor Movements are Fluid and Coordinated: No Coordination and Movement Description: hemiplegia, generalized weakness, moves very cautiously  Motor  Motor Motor: Hemiplegia Motor - Skilled Clinical Observations: Mild L hemi, UE>LE Mobility  Bed Mobility Bed Mobility: Rolling Right;Rolling Left;Supine to Sit;Sit to Supine Rolling Right: Supervision/verbal cueing Rolling Left: Supervision/Verbal cueing Supine to Sit: Supervision/Verbal cueing Sit to Supine: Supervision/Verbal cueing Transfers Sit to Stand: Contact Guard/Touching assist Stand to Sit: Contact Guard/Touching assist  Trunk/Postural Assessment  Cervical Assessment Cervical Assessment: Within Functional Limits Thoracic Assessment Thoracic Assessment: Within Functional Limits Lumbar Assessment Lumbar Assessment: Within Functional Limits Postural Control Postural Control: Within Functional Limits  Balance Balance Balance Assessed: Yes Standardized Balance Assessment Standardized Balance Assessment: Berg Balance Test Berg Balance Test Sit to Stand: Able to stand without using hands and stabilize independently Standing Unsupported: Able to stand safely 2 minutes Sitting with Back Unsupported but Feet Supported on Floor or Stool: Able to sit safely and securely 2 minutes Stand to Sit: Sits safely with minimal use of hands Transfers: Able to transfer safely, minor use of hands Standing Unsupported with Eyes Closed: Able to stand 10 seconds safely Standing Ubsupported with Feet Together: Able to place feet together independently and stand for 1 minute with supervision From Standing, Reach Forward with Outstretched Arm: Can reach confidently >25 cm (10") From Standing Position, Pick up  Object from Floor: Able to pick up shoe, needs supervision From Standing Position, Turn to Look Behind Over each Shoulder: Looks behind one side only/other side shows less weight shift Turn 360 Degrees: Able to turn 360 degrees safely but slowly Standing Unsupported, Alternately Place Feet on Step/Stool: Able to stand independently and complete 8 steps >20 seconds Standing Unsupported, One Foot in Front: Able to plae foot ahead of the other independently and hold 30 seconds Standing on One Leg: Able to lift leg independently and hold 5-10 seconds Total Score: 48 Static Sitting Balance Static Sitting - Balance Support: Feet supported Static Sitting - Level of Assistance: 5: Stand by assistance Dynamic Sitting Balance Dynamic Sitting - Balance Support: Feet supported;During functional activity Dynamic Sitting - Level of Assistance: 5: Stand by assistance Dynamic Sitting - Balance Activities: Reaching across midline Static Standing Balance Static Standing - Balance Support: No upper extremity supported;During functional activity Static Standing - Level of Assistance: 5: Stand by assistance Dynamic Standing Balance Dynamic Standing - Balance Support: During functional activity;No upper extremity supported Dynamic Standing - Level of Assistance: 4: Min assist Extremity/Trunk Assessment RUE Assessment RUE Assessment: Within Functional Limits LUE Assessment LUE Assessment: Exceptions to [facility] General Strength Comments: 3+/5 LUE Body System: Neuro Brunstrum levels for  arm and hand: Arm;Hand Brunstrum level for arm: Stage IV Movement is deviating from synergy Brunstrum level for hand: Stage V Independence from basic synergies   Refer to Care Plan for Long Term Goals  Recommendations for other services: Neuropsych   Discharge Criteria: Patient will be discharged from OT if patient refuses treatment 3 consecutive times without medical reason, if treatment goals not met, if there is a change  in medical status, if patient makes no progress towards goals or if patient is discharged from hospital.  The above assessment, treatment plan, treatment alternatives and goals were discussed and mutually agreed upon: by patient  Curtis Sites 03/10/2018, 11:46 AM

## 2018-03-10 NOTE — Evaluation (Signed)
Physical Therapy Assessment and Plan  Patient Details  Name: Jesse Price MRN: [id number] Date of Birth: 1966-02-26  PT Diagnosis: Coordination disorder, Difficulty walking, Hemiplegia dominant and Muscle weakness Rehab Potential: Excellent ELOS: 5-7 days   Today's Date: 03/10/2018 PT Individual Time: 1010-1055 AND 1605-1630 PT Individual Time Calculation (min): 45 min AND 25 min  and Today's Date: 03/10/2018 PT Missed Time: 15 Minutes Missed Time Reason: Unavailable (Comment)(SW with patient filling out paperwork)   Problem List:  Patient Active Problem List   Diagnosis Date Noted  . Embolic cerebral infarction ([place]) 03/09/2018  . Dyslipidemia   . Cerebrovascular accident (CVA) ([place])   . Left-sided weakness   . Benign essential HTN   . Tobacco abuse   . Diastolic dysfunction   . Leukocytosis   . Acute ischemic stroke ([place]) 03/05/2018  . CKD (chronic kidney disease), stage II 03/05/2018  . Alcohol dependence ([place]) 03/05/2018  . Essential hypertension 03/17/2017  . Abnormal kidney function 03/17/2017  . Hyperkalemia 03/17/2017  . Sleep disturbance 03/17/2017  . Sleep walking 03/17/2017  . Sebaceous cyst 03/17/2017  . Laceration of nose 03/17/2017    Past Medical History:  Past Medical History:  Diagnosis Date  . Asthma   . Hypertension    Past Surgical History:  Past Surgical History:  Procedure Laterality Date  . LOOP RECORDER INSERTION N/A 03/08/2018   Procedure: LOOP RECORDER INSERTION;  Surgeon: Evans Lance, MD;  Location: [facility];  Service: Cardiovascular;  Laterality: N/A;  . TEE WITHOUT CARDIOVERSION N/A 03/08/2018   Procedure: TRANSESOPHAGEAL ECHOCARDIOGRAM (TEE);  Surgeon: Jerline Pain, MD;  Location: [facility] ENDOSCOPY;  Service: Cardiovascular;  Laterality: N/A;    Assessment & Plan Clinical Impression: Patient is a 52 year old right-handed male with history of hypertension, tobacco abuse, CKD stage II and alcohol use. Per chart review  and patient, patient lives with sister. Independent prior to admission working full-time. One level home with 3 steps to entry. Sister works during the day. Presented 03/05/2018 with left-sided weakness. Cranial CT scan showed cytotoxic edema in the right mid frontal lobe extending into the adjacent anterior superior right centrum semi-ovale. Patient did not receive TPA. MRI of the brain reviewed, showing right frontal lobe infarction. Per report, small acute early subacute infarction within the right posterior medial frontal lobe. No associated hemorrhage. Multiple small chronic infarcts within the bilateral frontal lobes, right parietal lobe and left posterior temporal lobe. CT angiogram of head and neck with no dissection or aneurysm. Echocardiogram with ejection fraction of 67% grade 1 diastolic dysfunction. Neurology follow-up currently on aspirin for CVA prophylaxis. Subcutaneous Lovenox for DVT prophylaxis. TEE and loop recorder 03/08/2018. Therapy evaluations completed with recommendations of physical medicine rehab consult. Patient transferred to CIR on 03/09/2018 .   Patient currently requires min with mobility secondary to muscle weakness, decreased cardiorespiratoy endurance, unbalanced muscle activation, decreased coordination and decreased motor planning, decreased attention to left and decreased standing balance, decreased postural control, hemiplegia and decreased balance strategies.  Prior to hospitalization, patient was independent  with mobility and lived with Other (Comment)(sister) in a House home.  Home access is 3Stairs to enter.  Patient will benefit from skilled PT intervention to maximize safe functional mobility, minimize fall risk and decrease caregiver burden for planned discharge home with intermittent assist.  Anticipate patient will benefit from follow up [facility] at discharge.  PT - End of Session Activity Tolerance: Tolerates 30+ min activity without  fatigue Endurance Deficit: Yes Endurance Deficit Description: decreased PT Assessment  Rehab Potential (ACUTE/IP ONLY): Excellent PT Barriers to Discharge: Decreased caregiver support PT Barriers to Discharge Comments: Sister works during the day 3 days/week PT Patient demonstrates impairments in the following area(s): Balance;Endurance;Motor;Perception;Safety PT Transfers Functional Problem(s): Bed Mobility;Bed to Chair;Car;Furniture;Floor PT Locomotion Functional Problem(s): Stairs;Ambulation PT Plan PT Intensity: Minimum of 1-2 x/day ,45 to 90 minutes PT Frequency: 5 out of 7 days PT Duration Estimated Length of Stay: 5-7 days PT Treatment/Interventions: Ambulation/gait training;Disease management/prevention;Pain management;Stair training;Visual/perceptual remediation/compensation;Wheelchair propulsion/positioning;Therapeutic Activities;Patient/family education;DME/adaptive equipment instruction;Cognitive remediation/compensation;Functional electrical stimulation;Balance/vestibular training;Psychosocial support;Therapeutic Exercise;UE/LE Strength taining/ROM;Skin care/wound management;Functional mobility training;Community reintegration;Discharge planning;Neuromuscular re-education;UE/LE Coordination activities;Splinting/orthotics PT Transfers Anticipated Outcome(s): modified independent PT Locomotion Anticipated Outcome(s): modified independent PT Recommendation Follow Up Recommendations: Outpatient PT Patient destination: Home Equipment Recommended: To be determined  Skilled Therapeutic Intervention  Session 1:  Pt in supine and agreeable to therapy, denies pain. Performed functional mobility as detailed below including car transfer w/ CGA. Performed Berg Balance Scale as detailed below and explained significance of results to pt. Instructed pt in results of PT evaluation as detailed below, PT POC, rehab potential, rehab goals, and discharge recommendations. Additionally discussed CIR's  policies regarding fall safety and use of chair alarm and/or quick release belt. Pt verbalized understanding and in agreement. Ended session in supine, all needs in reach.  Session 2:  Pt in supine and agreeable to therapy, denies pain. Ambulated to/from therapy gym w/ close supervision w/o AD and increased time. Performed prolonged standing on airex pad w/ CGA and verbal/tactile cues for ankle balance strategies. Utilized LUE for clothespin task on basketball hoop, occasional verbal cues for grip/pincer technique w/ affected UE. Returned to room and ended session in supine, all needs in reach.   PT Evaluation Precautions/Restrictions Precautions Precautions: Fall Restrictions Weight Bearing Restrictions: No General PT Amount of Missed Time (min): 15 Minutes PT Missed Treatment Reason: Unavailable (Comment)(SW with patient filling out paperwork) Vital Signs Pain Pain Assessment Pain Scale: 0-10 Pain Score: 0-No pain Pain Type: Acute pain Pain Location: Arm Pain Orientation: Left Pain Descriptors / Indicators: Sore Pain Onset: On-going Pain Intervention(s): Therapeutic touch Home Living/Prior Functioning Home Living Available Help at Discharge: Family;Available PRN/intermittently(sister works during the day) Type of Home: House Home Access: Stairs to enter Technical brewer of Steps: 3 Entrance Stairs-Rails: None Home Layout: One level Bathroom Shower/Tub: Chiropodist: Handicapped height Bathroom Accessibility: Yes  Lives With: Other (Comment)(sister) Prior Function Level of Independence: Independent with basic ADLs;Independent with transfers;Independent with gait;Independent with homemaking with ambulation  Able to Take Stairs?: Yes Driving: Yes 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)  Vision/Perception  Vision - Assessment Eye Alignment: Within Passenger transport manager Range of Motion: Within Functional  Limits Alignment/Gaze Preference: Within Defined Limits Tracking/Visual Pursuits: Decreased smoothness of vertical tracking;Decreased smoothness of horizontal tracking Saccades: Decreased speed of saccadic movement Convergence: Within functional limits Diplopia Assessment: Other (comment)(present in 3 inch) Perception Perception: Impaired Inattention/Neglect: Does not attend to left side of body Praxis Praxis: Impaired  Cognition Overall Cognitive Status: Within Functional Limits for tasks assessed Arousal/Alertness: Awake/alert Orientation Level: Oriented X4 Attention: Selective Sustained Attention: Appears intact Selective Attention: Impaired Selective Attention Impairment: Functional complex;Verbal complex Memory: Appears intact Awareness Impairment: Anticipatory impairment Problem Solving: Impaired Problem Solving Impairment: Functional complex;Verbal complex Behaviors: Impulsive Safety/Judgment: Impaired Comments: decreased awareness of deficits, impulsivity Sensation Sensation Light Touch: Appears Intact Central sensation comments: Intact by assessment but pt reports pins and needles on L side Hot/Cold: Appears Intact Proprioception: Impaired by gross assessment Coordination  Gross Motor Movements are Fluid and Coordinated: No Fine Motor Movements are Fluid and Coordinated: No Coordination and Movement Description: hemiplegia, generalized weakness, moves very cautiously  Motor  Motor Motor: Hemiplegia Motor - Skilled Clinical Observations: Mild L hemi, UE>LE  Mobility Bed Mobility Bed Mobility: Rolling Right;Rolling Left;Supine to Sit;Sit to Supine Rolling Right: Supervision/verbal cueing Rolling Left: Supervision/Verbal cueing Supine to Sit: Supervision/Verbal cueing Sit to Supine: Supervision/Verbal cueing Transfers Transfers: Sit to Stand;Stand to Sit;Stand Pivot Transfers Sit to Stand: Contact Guard/Touching assist Stand to Sit: Contact Guard/Touching  assist Stand Pivot Transfers: Contact Guard/Touching assist Transfer (Assistive device): None Locomotion  Gait Ambulation: Yes Gait Assistance: Contact Guard/Touching assist Gait Distance (Feet): 250 Feet Assistive device: None Gait Gait: Yes Gait velocity: decreased Stairs / Additional Locomotion Stairs: Yes Stairs Assistance: Contact Guard/Touching assist Stair Management Technique: One rail Right Number of Stairs: 12 Height of Stairs: 6 Ramp: Contact Guard/touching assist Curb: Contact Guard/Touching assist Wheelchair Mobility Wheelchair Mobility: No  Trunk/Postural Assessment  Cervical Assessment Cervical Assessment: Within Functional Limits Thoracic Assessment Thoracic Assessment: Within Functional Limits Lumbar Assessment Lumbar Assessment: Within Functional Limits Postural Control Postural Control: Within Functional Limits  Balance Balance Balance Assessed: Yes Standardized Balance Assessment Standardized Balance Assessment: Berg Balance Test Berg Balance Test Sit to Stand: Able to stand without using hands and stabilize independently Standing Unsupported: Able to stand safely 2 minutes Sitting with Back Unsupported but Feet Supported on Floor or Stool: Able to sit safely and securely 2 minutes Stand to Sit: Sits safely with minimal use of hands Transfers: Able to transfer safely, minor use of hands Standing Unsupported with Eyes Closed: Able to stand 10 seconds safely Standing Ubsupported with Feet Together: Able to place feet together independently and stand for 1 minute with supervision From Standing, Reach Forward with Outstretched Arm: Can reach confidently >25 cm (10") From Standing Position, Pick up Object from Floor: Able to pick up shoe, needs supervision From Standing Position, Turn to Look Behind Over each Shoulder: Looks behind one side only/other side shows less weight shift Turn 360 Degrees: Able to turn 360 degrees safely but slowly Standing  Unsupported, Alternately Place Feet on Step/Stool: Able to stand independently and complete 8 steps >20 seconds Standing Unsupported, One Foot in Front: Able to plae foot ahead of the other independently and hold 30 seconds Standing on One Leg: Able to lift leg independently and hold 5-10 seconds Total Score: 48 Static Sitting Balance Static Sitting - Level of Assistance: 5: Stand by assistance Dynamic Sitting Balance Dynamic Sitting - Level of Assistance: 5: Stand by assistance Static Standing Balance Static Standing - Level of Assistance: 5: Stand by assistance Dynamic Standing Balance Dynamic Standing - Level of Assistance: 4: Min assist(CGA) Extremity Assessment  RLE Assessment RLE Assessment: Within Functional Limits LLE Assessment LLE Assessment: Exceptions to [facility] Passive Range of Motion (PROM) Comments: [facility] General Strength Comments: 5/5 except 4/5 hamstring and ant tib strength    Refer to Care Plan for Long Term Goals  Recommendations for other services: None   Discharge Criteria: Patient will be discharged from PT if patient refuses treatment 3 consecutive times without medical reason, if treatment goals not met, if there is a change in medical status, if patient makes no progress towards goals or if patient is discharged from hospital.  The above assessment, treatment plan, treatment alternatives and goals were discussed and mutually agreed upon: by patient  Kinzie Wickes K Shantai Tiedeman 03/10/2018, 11:00 AM

## 2018-03-10 NOTE — Progress Notes (Signed)
[facility]patient [facility] Individual Statement of Services  Patient Name:  Jesse Price  Date:  03/10/2018  Welcome to the [facility]patient Rehabilitation [facility].  Our goal is to provide you with an individualized program based on your diagnosis and situation, designed to meet your specific needs.  With this comprehensive rehabilitation program, you will be expected to participate in at least 3 hours of rehabilitation therapies Monday-Friday, with modified therapy programming on the weekends.  Your rehabilitation program will include the following services:  Physical Therapy (PT), Occupational Therapy (OT), 24 hour per day rehabilitation nursing, Neuropsychology, Case Management (Social Worker), Rehabilitation Medicine, Nutrition Services and Pharmacy Services  Weekly team conferences will be held on Wednesdays to discuss your progress.  Your Social Worker will talk with you frequently to get your input and to update you on team discussions.  Team conferences with you and your family in attendance may also be held.  Expected length of stay:  5 to 7 days  Overall anticipated outcome:  Independent with assistive device and supervision for stairs and floor transfers  Depending on your progress and recovery, your program may change. Your Social Worker will coordinate services and will keep you informed of any changes. Your Social Worker's name and contact numbers are listed  below.  The following services may also be recommended but are not provided by the Inpatient Rehabilitation Center:   Driving Evaluations  Home Health Rehabiltiation Services  Outpatient Rehabilitation Services  Vocational Rehabilitation   Arrangements will be made to provide these services after discharge if needed.  Arrangements include referral to agencies that provide these services.  Your insurance has been verified to be:  [employer] Your primary doctor is:  Dr. Crosby Oyster  Pertinent information will be shared  with your doctor and your insurance company.  Social Worker:  Staci Acosta, LCSW  [phone] or [phone]  Information discussed with and copy given to patient by: Elvera Lennox, 03/10/2018, 1:33 PM

## 2018-03-10 NOTE — Progress Notes (Signed)
[place] PHYSICAL MEDICINE & REHABILITATION PROGRESS NOTE   Subjective/Complaints:  Discussed rehab  ROS no CP, SOB, N/V?D   Objective:   No results found. Recent Labs    03/09/18 1352 03/10/18 0532  WBC 11.7* 10.3  HGB 15.7 15.0  HCT 47.2 45.0  PLT 355 346   Recent Labs    03/09/18 1352 03/10/18 0532  NA  --  136  K  --  4.4  CL  --  104  CO2  --  27  GLUCOSE  --  99  BUN  --  18  CREATININE 1.38* 1.29*  CALCIUM  --  9.5    Intake/Output Summary (Last 24 hours) at 03/10/2018 0753 Last data filed at 03/10/2018 0528 Gross per 24 hour  Intake 240 ml  Output 1500 ml  Net -1260 ml     Physical Exam: Vital Signs Blood pressure 103/67, pulse 66, temperature (!) 97.5 F (36.4 C), temperature source Oral, resp. rate 18, height 5' 6.5" (1.689 m), weight 76.6 kg, SpO2 100 %.   General: No acute distress Mood and affect are appropriate Heart: Regular rate and rhythm no rubs murmurs or extra sounds Lungs: Clear to auscultation, breathing unlabored, no rales or wheezes Abdomen: Positive bowel sounds, soft nontender to palpation, nondistended Extremities: No clubbing, cyanosis, or edema Skin: No evidence of breakdown, no evidence of rash Neurologic: Cranial nerves II through XII intact, motor strength is 5/5 in R deltoid, bicep, tricep, grip, hip flexor, knee extensors, ankle dorsiflexor and plantar flexor, 3- Left delt 4/5 bi tri , 3- finger flex and ext, 4/5 LLE except 3- ankle DF/PF, toe flex/ext Sensory exam normal sensation to light touch and proprioception in bilateral upper and lower extremities Cerebellar exam normal finger to nose to finger as well as heel to shin in bilateral upper and lower extremities Musculoskeletal: Full range of motion in all 4 extremities. No joint swelling   Assessment/Plan: 1. Functional deficits secondary to Right  frontal infarct which require 3+ hours per day of interdisciplinary therapy in a comprehensive inpatient rehab  setting.  Physiatrist is providing close team supervision and 24 hour management of active medical problems listed below.  Physiatrist and rehab team continue to assess barriers to discharge/monitor patient progress toward functional and medical goals  Care Tool:  Bathing              Bathing assist       Upper Body Dressing/Undressing Upper body dressing        Upper body assist      Lower Body Dressing/Undressing Lower body dressing            Lower body assist       Toileting Toileting    Toileting assist Assist for toileting: Set up assist Assistive Device Comment: urinal   Transfers Chair/bed transfer  Transfers assist           Locomotion Ambulation   Ambulation assist              Walk 10 feet activity   Assist           Walk 50 feet activity   Assist           Walk 150 feet activity   Assist           Walk 10 feet on uneven surface  activity   Assist           Wheelchair     Assist  Wheelchair 50 feet with 2 turns activity    Assist            Wheelchair 150 feet activity     Assist          Medical Problem List and Plan: 1.  Left-sided weakness secondary to right frontal lobe infarction.  Status post loop recorder- ASA 325mg  daily CIR evals today 2.  DVT Prophylaxis/Anticoagulation: Subcutaneous Lovenox.  Monitor for any bleeding episodes 3. Pain Management: Tylenol as needed 4. Mood: Provide emotional support 5. Neuropsych: This patient is capable of making decisions on his own behalf. 6. Skin/Wound Care: Routine skin checks 7. Fluids/Electrolytes/Nutrition: Routine in and outs with follow-up chemistries 8.  Hypertension.  Permissive hypertension (OK if < 220/120) resume home dose of Norvasc 5 mg daily as needed Vitals:   03/09/18 1959 03/10/18 0527  BP: 129/80 103/67  Pulse: 68 66  Resp: 18 18  Temp: 97.7 F (36.5 C) (!) 97.5 F (36.4 C)  SpO2:  100% 100%  Pt is normotensive without meds 9.  Hyperlipidemia.  Lipitor 10.  CKD stage II.  Follow-up chemistries ok, stable D/C IV 11.  Tobacco and alcohol use.  Provide counseling    LOS: 1 days A FACE TO FACE EVALUATION WAS PERFORMED  Erick Colace 03/10/2018, 7:53 AM

## 2018-03-10 NOTE — Progress Notes (Signed)
Occupational Therapy Session Note  Patient Details  Name: Jesse Price MRN: [id number] Date of Birth: Jul 12, 1965  Today's Date: 03/10/2018 OT Individual Time: 1345-1430 OT Individual Time Calculation (min): 45 min    Short Term Goals: Week 1:  OT Short Term Goal 1 (Week 1): STG= LTG d/t ELOS  Skilled Therapeutic Interventions/Progress Updates:  OT intervention with focus on LUE FMC and strengthening.  Pt issued small foam cubes and instructed on picking up with pincher grasp and transferring to cup with focus on grasp/release pattern.  Pt also issued med soft theraputty with small beads embedded.  Pt instructed on removing beads and placing in cup.  Discussed additional activities with theraputty.  Will discuss in more detail during later session.  Pt remained seated EOB with bed alarm activated and all needs within reach.   Therapy Documentation Precautions:  Precautions Precautions: Fall Restrictions Weight Bearing Restrictions: No Pain:  Pt c/o "muscle soreness" volar surface of L forearm; massage   Therapy/Group: Individual Therapy  Rich Brave 03/10/2018, 2:42 PM

## 2018-03-10 NOTE — Plan of Care (Signed)
  Problem: Consults Goal: RH STROKE PATIENT EDUCATION Description See Patient Education module for education specifics  Outcome: Progressing Goal: Diabetes Guidelines if Diabetic/Glucose > 140 Description If diabetic or lab glucose is > 140 mg/dl - Initiate Diabetes/Hyperglycemia Guidelines & Document Interventions  Outcome: Progressing   Problem: RH BOWEL ELIMINATION Goal: RH STG MANAGE BOWEL WITH ASSISTANCE Description STG Manage Bowel with min Assistance.  Outcome: Progressing Goal: RH STG MANAGE BOWEL W/MEDICATION W/ASSISTANCE Description STG Manage Bowel with Medication with min Assistance.  Outcome: Progressing   Problem: RH BLADDER ELIMINATION Goal: RH STG MANAGE BLADDER WITH ASSISTANCE Description STG Manage Bladder With min Assistance  Outcome: Progressing Goal: RH STG MANAGE BLADDER WITH MEDICATION WITH ASSISTANCE Description STG Manage Bladder With Medication With min Assistance.  Outcome: Progressing   Problem: RH SKIN INTEGRITY Goal: RH STG SKIN FREE OF INFECTION/BREAKDOWN Outcome: Progressing Goal: RH STG MAINTAIN SKIN INTEGRITY WITH ASSISTANCE Description STG Maintain Skin Integrity With min Assistance.  Outcome: Progressing   Problem: RH SAFETY Goal: RH STG ADHERE TO SAFETY PRECAUTIONS W/ASSISTANCE/DEVICE Description STG Adhere to Safety Precautions With min Assistance/Device.  Outcome: Progressing   Problem: RH PAIN MANAGEMENT Goal: RH STG PAIN MANAGED AT OR BELOW PT'S PAIN GOAL Outcome: Progressing   Problem: RH KNOWLEDGE DEFICIT Goal: RH STG INCREASE KNOWLEDGE OF HYPERTENSION Outcome: Progressing Goal: RH STG INCREASE KNOWLEGDE OF HYPERLIPIDEMIA Outcome: Progressing Goal: RH STG INCREASE KNOWLEDGE OF STROKE PROPHYLAXIS Outcome: Progressing   Problem: RH COGNITION-NURSING Goal: RH STG USES MEMORY AIDS/STRATEGIES W/ASSIST TO PROBLEM SOLVE Description STG Uses Memory Aids/Strategies With Assistance to Problem Solve. Outcome: Not Applicable

## 2018-03-11 ENCOUNTER — Inpatient Hospital Stay (HOSPITAL_COMMUNITY): Payer: Commercial Managed Care - PPO | Admitting: Physical Therapy

## 2018-03-11 ENCOUNTER — Inpatient Hospital Stay (HOSPITAL_COMMUNITY): Payer: Commercial Managed Care - PPO

## 2018-03-11 NOTE — Progress Notes (Addendum)
Social Work Patient ID: Jesse Price, male   DOB: September 04, 1965, 52 y.o.   MRN: [id number] Therapy team feels pt will reach his goals by Tuesday and feel he will be ready for discharge then. Awaiting confirmation from MD. Pt does want to go to OP therapies and has transportation for this. Await MD response. MD in agreement with discharge on Tuesday will work on discharge needs.

## 2018-03-11 NOTE — Progress Notes (Signed)
Physical Therapy Session Note  Patient Details  Name: Jesse Price MRN: [id number] Date of Birth: [birth year]  Today's Date: 03/11/2018 PT Individual Time: 4098-1191 AND 1650-1700 PT Individual Time Calculation (min): 55 min AND 70 min  Short Term Goals: Week 1:  PT Short Term Goal 1 (Week 1): =LTGs due to ELOS  Skilled Therapeutic Interventions/Progress Updates:   Session 1:  Pt in supine and agreeable to therapy, denies pain. Session focused on endurance w/ functional mobility and dynamic balance/gait. Ambulated around unit w/ supervision in >150' bouts w/o AD. Performed NuStep 7 min @ level 5 for endurance training. Assessed perception w/ dynavision in all planes. Reaction time similar in both L and R visual fields, approx. 1 sec slower when utilizing LUE instead of RUE. Worked on higher level balance while performing Mini-Best Test, scored 26/28 and explained significance of results to pt including aspects of daily life that would challenge his balance including cognitive dual tasks and standing on toes to reach high objects. Returned to room and ended session in supine, all needs in reach.   Session 2:  Pt in supine and agreeable to therapy, denies pain. Ambulated to/from toilet w/ supervision, supervision for hand hygiene at sink as well. Ambulated around unit w/ supervision w/o AD. Session focused on dynamic standing balance. Performed Wii bowling and tennis w/ supervision using LUE, emphasis on maintaining balance w/ stepping and trunk rotation activities. Worked on standing balance and motor skills w/ LUE including peg board and catching/tossing ball. Stood w/ feet together on firm surface and then on foam surface w/o UE support when tossing ball, close supervision, to challenge ankle and hip balance strategies. Added in cognitive dual tasking to catching ball by listing football teams and saying ABCs backwards. Pt's sister present at this time and therapist updated her regarding pt's  CLOF and d/c recommendations, per pt's request. She is unable to pick him up until evening of pt's estimated d/c date, she is requesting to pick him up Monday evening if possible. Both pt and sister agreeable to wait until Monday morning when SW and MD are present to have that conversation with treatment team, both verbalized understanding that it may not be able to occur and that pt's sister can pick him up Tuesday evening regardless. Will make medical team aware of their request. Returned to room and ended session in supine, all needs in reach.  Therapy Documentation Precautions:  Precautions Precautions: Fall Restrictions Weight Bearing Restrictions: No  Therapy/Group: Individual Therapy  Rufina Kimery K Anessa Charley 03/11/2018, 11:24 AM

## 2018-03-11 NOTE — IPOC Note (Signed)
Overall Plan of Care [facility]) Patient Details Name: Szymon Foiles MRN: [id number] DOB: 08/07/1965  Admitting Diagnosis: <principal problem not specified>  Hospital Problems: Active Problems:   Embolic cerebral infarction [facility])     Functional Problem List: Nursing Behavior, Bladder, Bowel, Medication Management, Pain, Safety, Skin Integrity  PT Balance, Endurance, Motor, Perception, Safety  OT Balance, Vision, Safety, Endurance, Motor  SLP    TR         Basic ADL's: OT Eating, Grooming, Bathing, Dressing, Toileting     Advanced  ADL's: OT       Transfers: PT Bed Mobility, Bed to Chair, Car, [employer], Floor  OT Toilet, Tub/Shower     Locomotion: PT Stairs, Ambulation     Additional Impairments: OT Fuctional Use of Upper Extremity  SLP        TR      Anticipated Outcomes Item Anticipated Outcome  Self Feeding mod I  Swallowing      Basic self-care  mod I  Toileting  mod I   Bathroom Transfers mod I  Bowel/Bladder  LBM 03/07/18, CONT B/B   Transfers  modified independent  Locomotion  modified independent  Communication     Cognition     Pain  Denies pain. tolerable 6/10  Safety/Judgment  Refrain from falls/injuries. Call light within reach, bed/chair alarm, proper footwear.    Therapy Plan: PT Intensity: Minimum of 1-2 x/day ,45 to 90 minutes PT Frequency: 5 out of 7 days PT Duration Estimated Length of Stay: 5-7 days OT Intensity: Minimum of 1-2 x/day, 45 to 90 minutes OT Frequency: 5 out of 7 days OT Duration/Estimated Length of Stay: 5-7 days      Team Interventions: Nursing Interventions Patient/Family Education, Bladder Management, Bowel Management, Pain Management, Medication Management  PT interventions Ambulation/gait training, Disease management/prevention, Pain management, Stair training, Visual/perceptual remediation/compensation, Wheelchair propulsion/positioning, Therapeutic Activities, Patient/family education, DME/adaptive  equipment instruction, Cognitive remediation/compensation, Functional electrical stimulation, Warden/ranger, Psychosocial support, Therapeutic Exercise, UE/LE Strength taining/ROM, Skin care/wound management, Functional mobility training, Community reintegration, Discharge planning, Neuromuscular re-education, UE/LE Coordination activities, Splinting/orthotics  OT Interventions Warden/ranger, Community reintegration, Disease mangement/prevention, Equities trader education, Self Care/advanced ADL retraining, UE/LE Coordination activities, Therapeutic Exercise, Discharge planning, DME/adaptive equipment instruction, Functional mobility training, Psychosocial support, Pain management, Skin care/wound managment, Therapeutic Activities, UE/LE Strength taining/ROM, Visual/perceptual remediation/compensation  SLP Interventions    TR Interventions    SW/CM Interventions Discharge Planning, Psychosocial Support, Patient/Family Education   Barriers to Discharge MD  Medical stability  Nursing      PT Decreased caregiver support Sister works during the day 3 days/week  OT      SLP      SW       Team Discharge Planning: Destination: PT-Home ,OT- Home , SLP-  Projected Follow-up: PT-Outpatient PT, OT-  None, SLP-  Projected Equipment Needs: PT-To be determined, OT- To be determined, SLP-  Equipment Details: PT- , OT-  Patient/family involved in discharge planning: PT- Patient,  OT-Patient, SLP-   MD ELOS: 5-7 days Medical Rehab Prognosis:  Excellent Assessment: The patient has been admitted for CIR therapies with the diagnosis of CVA. The team will be addressing functional mobility, strength, stamina, balance, safety, adaptive techniques and equipment, self-care, bowel and bladder mgt, patient and caregiver education, NMR, community reentry, ego support. Goals have been set at mod I for mobility and self-care.    Ranelle Oyster, MD, [facility]      See Team Conference  Notes for weekly updates to the plan  of care

## 2018-03-11 NOTE — Progress Notes (Signed)
Occupational Therapy Session Note  Patient Details  Name: Jesse Price MRN: [id number] Date of Birth: 1965/10/30  Today's Date: 03/11/2018 OT Individual Time: 0700-0800 OT Individual Time Calculation (min): 60 min    Short Term Goals: Week 1:  OT Short Term Goal 1 (Week 1): STG= LTG d/t ELOS  Skilled Therapeutic Interventions/Progress Updates:    1:1. Pt recived seated in bed. No pain reported. Pt sits EOB and set up with red foam handle on fork with superviison and VC ofr holding fork handle with tripod grip not gross grasp. Pt ambulates with no AD and supervision into bathroom. Pt showers sit to stand level with supervisiona nd VC for not using grab bar as this is not avaiable at home and forced use of LUE for washing 75% of body. Pt dresses EOB with sueprvision using hemi techniques without VC. Pt stoops to floor to clean up towels and requires VC for attention to LUE to let go of towels when placing into dirty linen bag. Pt applies lotion to feet for BUE coordination/LUE NMR. OT applies teds. Pt completes oral care standing at sink  With supervision. Exited session with pt seated in bed call light in reach and exit alarm on.  Therapy Documentation Precautions:  Precautions Precautions: Fall Restrictions Weight Bearing Restrictions: No General:   Therapy/Group: Individual Therapy  [name] 03/11/2018, 7:06 AM

## 2018-03-11 NOTE — Progress Notes (Signed)
[facility] PHYSICAL MEDICINE & REHABILITATION PROGRESS NOTE   Subjective/Complaints:  Left hand getting a little stronger, L handed , works as Psychologist, occupational, drives to work  ROS no CP, SOB, N/V?D   Objective:   No results found. Recent Labs    03/09/18 1352 03/10/18 0532  WBC 11.7* 10.3  HGB 15.7 15.0  HCT 47.2 45.0  PLT 355 346   Recent Labs    03/09/18 1352 03/10/18 0532  NA  --  136  K  --  4.4  CL  --  104  CO2  --  27  GLUCOSE  --  99  BUN  --  18  CREATININE 1.38* 1.29*  CALCIUM  --  9.5    Intake/Output Summary (Last 24 hours) at 03/11/2018 0734 Last data filed at 03/11/2018 0323 Gross per 24 hour  Intake 720 ml  Output 1200 ml  Net -480 ml     Physical Exam: Vital Signs Blood pressure (!) 126/94, pulse 73, temperature 97.9 F (36.6 C), resp. rate 18, height 5' 6.5" (1.689 m), weight 76.6 kg, SpO2 97 %.   General: No acute distress Mood and affect are appropriate Heart: Regular rate and rhythm no rubs murmurs or extra sounds Lungs: Clear to auscultation, breathing unlabored, no rales or wheezes Abdomen: Positive bowel sounds, soft nontender to palpation, nondistended Extremities: No clubbing, cyanosis, or edema Skin: No evidence of breakdown, no evidence of rash Neurologic: Cranial nerves II through XII intact, motor strength is 5/5 in R deltoid, bicep, tricep, grip, hip flexor, knee extensors, ankle dorsiflexor and plantar flexor, 3- Left delt 4/5 bi tri , 3- finger flex and ext, 4/5 LLE except 3- ankle DF/PF, toe flex/ext Sensory exam normal sensation to light touch and proprioception in bilateral upper and lower extremities Cerebellar exam normal finger to nose to finger as well as heel to shin in bilateral upper and lower extremities Musculoskeletal: Full range of motion in all 4 extremities. No joint swelling   Assessment/Plan: 1. Functional deficits secondary to Right  frontal infarct which require 3+ hours per day of interdisciplinary  therapy in a comprehensive inpatient rehab setting.  Physiatrist is providing close team supervision and 24 hour management of active medical problems listed below.  Physiatrist and rehab team continue to assess barriers to discharge/monitor patient progress toward functional and medical goals  Care Tool:  Bathing    Body parts bathed by patient: Right arm, Left upper leg, Right lower leg, Left arm, Chest, Front perineal area, Left lower leg, Abdomen, Face, Buttocks, Right upper leg         Bathing assist Assist Level: Contact Guard/Touching assist     Upper Body Dressing/Undressing Upper body dressing   What is the patient wearing?: Pull over shirt    Upper body assist Assist Level: Minimal Assistance - Patient > 75%    Lower Body Dressing/Undressing Lower body dressing      What is the patient wearing?: Pants     Lower body assist Assist for lower body dressing: Minimal Assistance - Patient > 75%     Toileting Toileting    Toileting assist Assist for toileting: Set up assist Assistive Device Comment: urinal   Transfers Chair/bed transfer  Transfers assist     Chair/bed transfer assist level: Contact Guard/Touching assist     Locomotion Ambulation   Ambulation assist      Assist level: Contact Guard/Touching assist Assistive device: Other (comment)(none) Max distance: 150'   Walk 10 feet activity  Assist     Assist level: Contact Guard/Touching assist Assistive device: Other (comment)(none)   Walk 50 feet activity   Assist    Assist level: Contact Guard/Touching assist Assistive device: Other (comment)(none)    Walk 150 feet activity   Assist    Assist level: Contact Guard/Touching assist Assistive device: Other (comment)(none)    Walk 10 feet on uneven surface  activity   Assist     Assist level: Contact Guard/Touching assist Assistive device: Other (comment)(none)   Wheelchair     Assist Will patient use  wheelchair at discharge?: No   Wheelchair activity did not occur: N/A         Wheelchair 50 feet with 2 turns activity    Assist    Wheelchair 50 feet with 2 turns activity did not occur: N/A       Wheelchair 150 feet activity     Assist Wheelchair 150 feet activity did not occur: N/A        Medical Problem List and Plan: 1.  Left-sided weakness secondary to right frontal lobe infarction. Onset 03/05/18  Status post loop recorder- ASA 325mg  daily CIR evals today 2.  DVT Prophylaxis/Anticoagulation: Subcutaneous Lovenox.  Monitor for any bleeding episodes CBC normal 3. Pain Management: Tylenol as needed 4. Mood: Provide emotional support 5. Neuropsych: This patient is capable of making decisions on his own behalf. 6. Skin/Wound Care: Routine skin checks 7. Fluids/Electrolytes/Nutrition: Routine in and outs with follow-up chemistries 8.  Hypertension.  Permissive hypertension (OK if < 220/120) resume home dose of Norvasc 5 mg daily as needed Vitals:   03/10/18 1421 03/10/18 2104  BP: (!) 132/91 (!) 126/94  Pulse: 74 73  Resp: 18 18  Temp: 97.9 F (36.6 C) 97.9 F (36.6 C)  SpO2: 98% 97%  mild elevation of systolic will cont to monitor prior to resuming amlodipine 9.  Hyperlipidemia.  Lipitor 10.  CKD stage II.  Follow-up chemistries creat improving 11.  Tobacco and alcohol use.  Provide counseling    LOS: 2 days A FACE TO FACE EVALUATION WAS PERFORMED  Erick Colace 03/11/2018, 7:34 AM

## 2018-03-12 ENCOUNTER — Inpatient Hospital Stay (HOSPITAL_COMMUNITY): Payer: Commercial Managed Care - PPO | Admitting: Physical Therapy

## 2018-03-12 ENCOUNTER — Inpatient Hospital Stay (HOSPITAL_COMMUNITY): Payer: Commercial Managed Care - PPO

## 2018-03-12 DIAGNOSIS — I63421 Cerebral infarction due to embolism of right anterior cerebral artery: Secondary | ICD-10-CM

## 2018-03-12 NOTE — Plan of Care (Signed)
  Problem: Consults Goal: RH STROKE PATIENT EDUCATION Description See Patient Education module for education specifics  Outcome: Progressing Goal: Diabetes Guidelines if Diabetic/Glucose > 140 Description If diabetic or lab glucose is > 140 mg/dl - Initiate Diabetes/Hyperglycemia Guidelines & Document Interventions  Outcome: Progressing   Problem: RH BOWEL ELIMINATION Goal: RH STG MANAGE BOWEL WITH ASSISTANCE Description STG Manage Bowel with min Assistance.  Outcome: Progressing Goal: RH STG MANAGE BOWEL W/MEDICATION W/ASSISTANCE Description STG Manage Bowel with Medication with min Assistance.  Outcome: Progressing   Problem: RH BLADDER ELIMINATION Goal: RH STG MANAGE BLADDER WITH ASSISTANCE Description STG Manage Bladder With min Assistance  Outcome: Progressing Goal: RH STG MANAGE BLADDER WITH MEDICATION WITH ASSISTANCE Description STG Manage Bladder With Medication With min Assistance.  Outcome: Progressing   Problem: RH SKIN INTEGRITY Goal: RH STG SKIN FREE OF INFECTION/BREAKDOWN Outcome: Progressing Goal: RH STG MAINTAIN SKIN INTEGRITY WITH ASSISTANCE Description STG Maintain Skin Integrity With min Assistance.  Outcome: Progressing   Problem: RH SAFETY Goal: RH STG ADHERE TO SAFETY PRECAUTIONS W/ASSISTANCE/DEVICE Description STG Adhere to Safety Precautions With min Assistance/Device.  Outcome: Progressing   Problem: RH PAIN MANAGEMENT Goal: RH STG PAIN MANAGED AT OR BELOW PT'S PAIN GOAL Outcome: Progressing   Problem: RH KNOWLEDGE DEFICIT Goal: RH STG INCREASE KNOWLEDGE OF HYPERTENSION Outcome: Progressing Goal: RH STG INCREASE KNOWLEGDE OF HYPERLIPIDEMIA Outcome: Progressing Goal: RH STG INCREASE KNOWLEDGE OF STROKE PROPHYLAXIS Outcome: Progressing

## 2018-03-12 NOTE — Progress Notes (Signed)
Rested good during night. Continent B & B, using urinal, staff emptying. Mainly concerned about left hand and LUE, "it's still weak." Also complains of tingling at times. Alfredo Martinez A

## 2018-03-12 NOTE — Progress Notes (Signed)
Physical Therapy Session Note  Patient Details  Name: Jesse Price MRN: [id number] Date of Birth: Oct 17, 1965  Today's Date: 03/12/2018 PT Individual Time: 1100-1150 AND 1440-1550 PT Individual Time Calculation (min): 50 min AND 70 min  Short Term Goals: Week 1:  PT Short Term Goal 1 (Week 1): =LTGs due to ELOS  Skilled Therapeutic Interventions/Progress Updates:   Session 1:  Pt in supine and agreeable to therapy, denies pain. Session focused on functional balance and incorporating LUE into functional tasks. Ambulated around unit w/ distant supervision w/o AD. Performed Wii bowling w/ LUE and supervision for stepping and dynamic balance strategies. Verbal cues for safety. Performed BUE tasks while standing on foam surface and foam incline w/ supervision. Occasional verbal cues for technique w/ LUE fine motor skills and ankle balance strategies. Tossed ball back and forth on foam to work on balance w/ postural perturbations. Close supervision for all standing balance tasks for safety, pt able to self correct any LOB safely. Performed limits of stability training on biodex, scored 47%, 47%, and 48% on intermediate level w/o UE support. Performed LUE fine motor tasks during intermittent seated rest breaks including practicing buttoning and playing Connect 4. Returned to room and ended session seated EOB w/ independence, all needs in reach.   Session 2:  Pt in supine and agreeable to therapy, denies pain. Sister present asking questions regarding pt being safe at home by himself at d/c and expressing concerns about him having another stroke. Discussed her coming in on Monday morning to talk w/ MD about any medical concerns and also discussed his CLOF and anticipated ability to be at the modified independent level at home. Encouraged both pt and sister to continue discussions regarding d/c plan so both are comfortable with the arrangement. Session focused on community mobility, endurance training, and  global strengthening. Performed NuStep 7 min @ level 5. Ambulated around unit and to outside hospital w/ supervision. Negotiated multiple sets of stairs, curbs, uneven surfaces, ramps, and busy environments w/ supervision overall and no verbal cues needed for safety or environmental awareness. Ambulated 20+ minutes at a time before needing rest break. Returned to unit and performed strengthening exercises in quadruped and in tall kneeling. Performed bird-dog exercise 3x10 reps and modified push-ups x30 reps. Used LUE to play bean bag toss game while in tall kneeling requiring adequate trunk postural control in response to perturbations. Performed hand dynamometry, RUE @ 50, 50, and 55; LUE at 40, 45, and 46. Explained significance of results to pt and educated on emphasizing fine motor tasks as this is his primary impairment. Provided w/ large nut/bolt to work on fine motor skills outside of therapy. Returned to room and ended session in supine, all needs in reach.   Therapy Documentation Precautions:  Precautions Precautions: Fall Restrictions Weight Bearing Restrictions: No Pain: Pain Assessment Pain Scale: 0-10 Pain Score: 0-No pain  Therapy/Group: Individual Therapy  Viriginia Amendola Melton Krebs 03/12/2018, 11:55 AM

## 2018-03-12 NOTE — Progress Notes (Signed)
Patient ID: Jesse Price, male   DOB: 07-12-1965, 52 y.o.   MRN: [id number]   Jesse Price is a 52 y.o. male who was admitted for CIR with functional deficits secondary to a right frontal infarct  Past Medical History:  Diagnosis Date  . Asthma   . Hypertension      Subjective: No new complaints. No new problems.  Very pleased with progress.  Left hand grip slowly improving  Objective: Vital signs in last 24 hours: Temp:  [97.8 F (36.6 C)-98.5 F (36.9 C)] 98 F (36.7 C) (11/02 0537) Pulse Rate:  [67-74] 67 (11/02 0537) Resp:  [18] 18 (11/01 2023) BP: (98-119)/(72-88) 98/72 (11/02 0537) SpO2:  [100 %] 100 % (11/02 0537) Weight change:  Last BM Date: 03/11/18(per patient)  Intake/Output from previous day: 11/01 0701 - 11/02 0700 In: 960 [P.O.:960] Out: 1150 [Urine:1150]   Patient Vitals for the past 24 hrs:  BP Temp Temp src Pulse Resp SpO2  03/12/18 0537 98/72 98 F (36.7 C) - 67 - 100 %  03/11/18 2023 114/88 97.8 F (36.6 C) - 72 18 100 %  03/11/18 1406 119/76 98.5 F (36.9 C) Oral 74 18 100 %     Physical Exam General: No apparent distress   HEENT: not dry Lungs: Normal effort. Lungs clear to auscultation, no crackles or wheezes. Cardiovascular: Regular rate and rhythm, no edema Abdomen: S/NT/ND; BS(+) Musculoskeletal:  unchanged Neurological: No new neurological deficits with left-sided weakness Wounds: N/A    Extremities.  No edema Mental state: Alert, oriented, cooperative    Lab Results: BMET    Component Value Date/Time   NA 136 03/10/2018 0532   K 4.4 03/10/2018 0532   CL 104 03/10/2018 0532   CO2 27 03/10/2018 0532   GLUCOSE 99 03/10/2018 0532   BUN 18 03/10/2018 0532   CREATININE 1.29 (H) 03/10/2018 0532   CREATININE 1.60 (H) 03/10/2017 1640   CALCIUM 9.5 03/10/2018 0532   GFRNONAA >60 03/10/2018 0532   GFRAA >60 03/10/2018 0532   CBC    Component Value Date/Time   WBC 10.3 03/10/2018 0532   RBC 4.92 03/10/2018 0532   HGB  15.0 03/10/2018 0532   HCT 45.0 03/10/2018 0532   PLT 346 03/10/2018 0532   MCV 91.5 03/10/2018 0532   MCH 30.5 03/10/2018 0532   MCHC 33.3 03/10/2018 0532   RDW 12.6 03/10/2018 0532   LYMPHSABS 2.5 03/10/2018 0532   MONOABS 1.1 (H) 03/10/2018 0532   EOSABS 0.4 03/10/2018 0532   BASOSABS 0.1 03/10/2018 0532    Medications: I have reviewed the patient's current medications.  Assessment/Plan:  Functional deficits secondary to a right frontal infarct with left-sided weakness DVT prophylaxis continue Lovenox Essential hypertension.  Blood pressure low normal today.  We will continue to observe off amlodipine    Length of stay, days: 3  Gordy Savers , MD 03/12/2018, 11:28 AM

## 2018-03-12 NOTE — Progress Notes (Signed)
Physical Therapy Discharge Summary  Patient Details  Name: Jerold Yoss MRN: [id number] Date of Birth: 1965-06-14  Today's Date: 03/14/2018 PT Individual Time: 0900-1000 and 1515-1600 PT Individual Time Calculation (min): 60 min and 45 min    Patient has met 10 of 10 long term goals due to improved activity tolerance, improved balance, improved postural control, increased strength, ability to compensate for deficits and improved coordination.  Patient to discharge at an ambulatory level Modified Independent.   Patient's care partner is independent to provide the necessary physical assistance at discharge. Pt returning home to live w/ sister who works during the day 3 days per week.   Reasons goals not met: n/a  Recommendation:  Patient will benefit from ongoing skilled PT services in outpatient setting to continue to advance safe functional mobility, address ongoing impairments in balance, and minimize fall risk.  Equipment: No equipment provided  Reasons for discharge: treatment goals met and discharge from hospital  Patient/family agrees with progress made and goals achieved: Yes   PT Treatment Interventions: Session 1: Pt seated in recliner upon PT arrival, agreeable to therapy tx and denies pain. Pt ambulated throughout the unit this session 300+ ft without AD Mod I. Pt ambulated to the therapy gym Mod I and ascended/descended 12 steps without rails and reciprocal pattern independently. Pt participated in berg balance test as detailed below. Pt ambulated to rehab apartment and performed furniture transfers and bed mobility independently. Pt ambulated to ortho gym and performed car transfer independently. Pt ambulated from unit>atrium>north tower and back to unit 1000 ft independently working on community ambulation and navigating busy hallways. Pt worked on dynamic standing balance to participate in x 2 games of wii bowling. Pt ambulated back to room and left seated with needs in  reach.   Session 2: Pt seated in recliner upon PT arrival, agreeable to therapy tx and denies pain. Pt ambulated from room>outside hospital x 500 ft independently. Pt worked on ambulation on unlevel surfaces, ambulating in grass, navigating inclines/declines, ascending/descending stairs all outside of hospital for community ambulation x 1000 ft, independently. Pt ambulated back to unit independently. Pt performed floor transfer this session without having to pull up on mat, independent. Pt performed planks for core strengthening on extended elbows 2 x 30 sec. Pt performed bird dog exercise 2 x 10 alternating opposite UE/LE extension for strengthening. Pt worked on dynamic standing balance in order to kick soccer ball back and forth, also worked on Dietitian while walking/dribbling soccer ball back to room. Pt left in room, independent.             PT Discharge Precautions/Restrictions Precautions Precautions: Fall Restrictions Weight Bearing Restrictions: No Vital Signs Therapy Vitals Temp: 97.8 F (36.6 C) Temp Source: Oral Pulse Rate: 70 Resp: 19 BP: 140/89 Patient Position (if appropriate): Lying Oxygen Therapy SpO2: 100 % O2 Device: Room Air Pain   denies pain Cognition Overall Cognitive Status: Within Functional Limits for tasks assessed Arousal/Alertness: Awake/alert Orientation Level: Oriented X4 Attention: Selective Sustained Attention: Appears intact Safety/Judgment: Appears intact Sensation Sensation Light Touch: Impaired Detail Central sensation comments: snesation grossly intact, reports "different feeling" on the L UE but denies numbness/tingling Proprioception: Appears Intact Coordination Gross Motor Movements are Fluid and Coordinated: No Fine Motor Movements are Fluid and Coordinated: No Coordination and Movement Description: L UE coordination impaired Motor  Motor Motor: Hemiplegia Motor - Discharge Observations: mild L hemiparesis UE>LE   Mobility Bed Mobility Bed Mobility: Rolling Right;Rolling Left;Supine to Sit;Sit to Supine Rolling  Right: Independent Rolling Left: Independent Supine to Sit: Independent Sit to Supine: Independent Transfers Transfers: Sit to Stand;Stand to Sit;Stand Pivot Transfers Sit to Stand: Independent Stand to Sit: Independent Stand Pivot Transfers: Independent Transfer (Assistive device): None Locomotion  Gait Ambulation: Yes Gait Assistance: Independent Gait Distance (Feet): 300 Feet Assistive device: None Gait Gait: Yes Gait Pattern: Within Functional Limits Gait velocity: decreased Stairs / Additional Locomotion Stairs: Yes Stairs Assistance: Independent with assistive device Stair Management Technique: One rail Left Number of Stairs: 12 Height of Stairs: 6  Trunk/Postural Assessment  Cervical Assessment Cervical Assessment: Within Functional Limits Thoracic Assessment Thoracic Assessment: Within Functional Limits Lumbar Assessment Lumbar Assessment: Within Functional Limits Postural Control Postural Control: Within Functional Limits  Balance Balance Balance Assessed: Yes Standardized Balance Assessment Standardized Balance Assessment: Berg Balance Test Berg Balance Test Sit to Stand: Able to stand without using hands and stabilize independently Standing Unsupported: Able to stand safely 2 minutes Sitting with Back Unsupported but Feet Supported on Floor or Stool: Able to sit safely and securely 2 minutes Stand to Sit: Sits safely with minimal use of hands Transfers: Able to transfer safely, minor use of hands Standing Unsupported with Eyes Closed: Able to stand 10 seconds safely Standing Ubsupported with Feet Together: Able to place feet together independently and stand 1 minute safely From Standing, Reach Forward with Outstretched Arm: Can reach confidently >25 cm (10") From Standing Position, Pick up Object from Floor: Able to pick up shoe safely and easily From  Standing Position, Turn to Look Behind Over each Shoulder: Looks behind from both sides and weight shifts well Turn 360 Degrees: Able to turn 360 degrees safely in 4 seconds or less Standing Unsupported, Alternately Place Feet on Step/Stool: Able to stand independently and safely and complete 8 steps in 20 seconds Standing Unsupported, One Foot in Front: Able to place foot tandem independently and hold 30 seconds Standing on One Leg: Able to lift leg independently and hold > 10 seconds Total Score: 56 Static Sitting Balance Static Sitting - Level of Assistance: 7: Independent Dynamic Sitting Balance Dynamic Sitting - Level of Assistance: 7: Independent Static Standing Balance Static Standing - Level of Assistance: 7: Independent Dynamic Standing Balance Dynamic Standing - Level of Assistance: 7: Independent Extremity Assessment  RLE Assessment RLE Assessment: Within Functional Limits LLE Assessment LLE Assessment: Exceptions to [facility] Passive Range of Motion (PROM) Comments: [facility] General Strength Comments: 5/5 except 4/5 hamstring and ant tib strength  Netta Corrigan, PT, DPT 03/14/18  9:13 AM

## 2018-03-12 NOTE — Progress Notes (Signed)
Occupational Therapy Session Note  Patient Details  Name: Jesse Price MRN: [id number] Date of Birth: 1966-01-17  Today's Date: 03/12/2018 OT Individual Time: 9390-3009 OT Individual Time Calculation (min): 60 min    Short Term Goals: Week 1:  OT Short Term Goal 1 (Week 1): STG= LTG d/t ELOS  Skilled Therapeutic Interventions/Progress Updates:    1:1. Pt bathes standing at shower level with supervision. Pt requires question cues to gather all needed items prior to shower. Pt demo good safety awareness during dressing sitting to thread BLE into pants. Pt dons shirt in standing with MOD I. Pt dons teds with supervision/VC for technique and increased time to bunch up stocking prior to putting toes in. Pt grooms in standing with MOD I using LUE as dominant hand. Pt uses towel to dry off walls of shower for NMR. Pt completes 9HPT LUE 1 min 46 sec and RUE 28.9 seconds. Pt completes palm<>finger translation of checkers and coins for improved FMC/NMR of LUE and VC for decreasing compensatory techniques of shaking and using gravity. Pt demo decreased control of coins d/t small size initially dropping 30% of time. Pt returned to room and OT exits session with call light in reach, exit alamr on and all needs met  Therapy Documentation Precautions:  Precautions Precautions: Fall Restrictions Weight Bearing Restrictions: No General:   Vital Signs: Therapy Vitals Temp: 98 F (36.7 C) Pulse Rate: 67 BP: 98/72 Patient Position (if appropriate): Lying Oxygen Therapy SpO2: 100 % O2 Device: Room Air Pain:   ADL: ADL Grooming: Minimal cueing, Contact guard Where Assessed-Grooming: Edge of bed Upper Body Bathing: Contact guard Where Assessed-Upper Body Bathing: Shower Lower Body Bathing: Contact guard Where Assessed-Lower Body Bathing: Shower Upper Body Dressing: Minimal assistance, Minimal cueing Where Assessed-Upper Body Dressing: Edge of bed Lower Body Dressing: Minimal assistance,  Minimal cueing Where Assessed-Lower Body Dressing: Edge of bed Toileting: Minimal assistance Where Assessed-Toileting: Glass blower/designer: Psychiatric nurse Method: Counselling psychologist: Energy manager: Environmental education officer Method: Heritage manager: Civil engineer, contracting with back Glass blower/designer   Exercises:   Other Treatments:     Therapy/Group: Individual Therapy    Tonny Branch 03/12/2018, 9:09 AM

## 2018-03-13 ENCOUNTER — Inpatient Hospital Stay (HOSPITAL_COMMUNITY): Payer: Commercial Managed Care - PPO

## 2018-03-13 NOTE — Progress Notes (Signed)
Occupational Therapy Session Note  Patient Details  Name: Jesse Price MRN: [id number] Date of Birth: 1965/06/28  Today's Date: 03/13/2018 OT Individual Time: 0930-1030 OT Individual Time Calculation (min): 60 min    Short Term Goals: Week 1:  OT Short Term Goal 1 (Week 1): STG= LTG d/t ELOS  Skilled Therapeutic Interventions/Progress Updates:    1;1. Pt easily aroused from sleep upon entering room. Pt gathers all items for shower with 1 VC for recalling where wash clothes are placed. Pt bathes standing with distant supervision. Pt dresses sit to stand from EOB with MOD I recalling to sit prior to threading BLE as pt gathers clothing prior to dressing. Pt educated on wearing non skid socks/house shoes when ambulating around home as opposed to barefoot to decrease fall risk. Pt grooms at sink in standing with MOD I. Pt ambulates throughout session with distant supervision and no AD. 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 Pt able to carry heavy tool box with LUE to elevated mat and stand to select tools to change wheel height min VC for looking at LUE when attempting to let go of tool. Pt able to manage one tool in each hand for BUE coordination  Therapy Documentation Precautions:  Precautions Precautions: Fall Restrictions Weight Bearing Restrictions: No General:   Vital Signs:   Pain: Pain Assessment Pain Scale: 0-10 Pain Score: 0-No pain ADL: ADL Grooming: Minimal cueing, Contact guard Where Assessed-Grooming: Edge of bed Upper Body Bathing: Contact guard Where Assessed-Upper Body Bathing: Shower Lower Body Bathing: Contact guard Where Assessed-Lower Body Bathing: Shower Upper Body Dressing: Minimal assistance, Minimal cueing Where Assessed-Upper Body Dressing: Edge of bed Lower Body Dressing: Minimal assistance, Minimal cueing Where Assessed-Lower Body Dressing: Edge of bed Toileting: Minimal  assistance Where Assessed-Toileting: Teacher, adult education: Curator Method: Proofreader: Acupuncturist: Insurance underwriter Method: Designer, industrial/product: Information systems manager with back Clinical research associate   Exercises:   Other Treatments:     Therapy/Group: Individual Therapy  [name] 03/13/2018, 9:40 AM

## 2018-03-13 NOTE — Progress Notes (Signed)
Patient ID: Jesse Price, male   DOB: 06/02/1965, 52 y.o.   MRN: [id number]   Jesse Price is a 52 y.o. male who was admitted for CIR with functional deficits secondary to a right frontal embolic stroke.  Past Medical History:  Diagnosis Date  . Asthma   . Hypertension      Subjective: No new complaints. No new problems. Slept well. Feeling OK and pleased with progress.  Main deficit is decreased left grip strength  Objective: Vital signs in last 24 hours: Temp:  [97.8 F (36.6 C)-98.2 F (36.8 C)] 97.8 F (36.6 C) (11/03 0535) Pulse Rate:  [77-84] 77 (11/03 0535) Resp:  [16-20] 16 (11/03 0535) BP: (108-133)/(71-92) 108/71 (11/03 0535) SpO2:  [96 %-99 %] 99 % (11/03 0535) Weight change:  Last BM Date: 03/11/18  Intake/Output from previous day: 11/02 0701 - 11/03 0700 In: 1040 [P.O.:1040] Out: 2050 [Urine:2050]  Patient Vitals for the past 24 hrs:  BP Temp Pulse Resp SpO2  03/13/18 0535 108/71 97.8 F (36.6 C) 77 16 99 %  03/12/18 1946 108/76 98.1 F (36.7 C) 79 18 96 %  03/12/18 1609 (!) 133/92 98.2 F (36.8 C) 84 20 96 %    Physical Exam General: No apparent distress   HEENT: not dry Lungs: Normal effort. Lungs clear to auscultation, no crackles or wheezes. Cardiovascular: Regular rate and rhythm, no edema Abdomen: S/NT/ND; BS(+) Musculoskeletal:  unchanged Neurological: No new neurological deficits with left-sided weakness Wounds: N/A    Skin: clear  ILR anterior chest Mental state: Alert, oriented, cooperative    Lab Results: BMET    Component Value Date/Time   NA 136 03/10/2018 0532   K 4.4 03/10/2018 0532   CL 104 03/10/2018 0532   CO2 27 03/10/2018 0532   GLUCOSE 99 03/10/2018 0532   BUN 18 03/10/2018 0532   CREATININE 1.29 (H) 03/10/2018 0532   CREATININE 1.60 (H) 03/10/2017 1640   CALCIUM 9.5 03/10/2018 0532   GFRNONAA >60 03/10/2018 0532   GFRAA >60 03/10/2018 0532   CBC    Component Value Date/Time   WBC 10.3 03/10/2018 0532   RBC 4.92 03/10/2018 0532   HGB 15.0 03/10/2018 0532   HCT 45.0 03/10/2018 0532   PLT 346 03/10/2018 0532   MCV 91.5 03/10/2018 0532   MCH 30.5 03/10/2018 0532   MCHC 33.3 03/10/2018 0532   RDW 12.6 03/10/2018 0532   LYMPHSABS 2.5 03/10/2018 0532   MONOABS 1.1 (H) 03/10/2018 0532   EOSABS 0.4 03/10/2018 0532   BASOSABS 0.1 03/10/2018 0532    Medications: I have reviewed the patient's current medications.  Assessment/Plan:  Status post right frontal infarct with left-sided weakness.  Continue CIR DVT prophylaxis.  Continue Lovenox Essential hypertension.  Blood pressure remains low normal.  We will continue to hold amlodipine    Length of stay, days: 4  Gordy Savers , MD 03/13/2018, 9:56 AM

## 2018-03-14 ENCOUNTER — Inpatient Hospital Stay (HOSPITAL_COMMUNITY): Payer: Commercial Managed Care - PPO | Admitting: Physical Therapy

## 2018-03-14 ENCOUNTER — Inpatient Hospital Stay (HOSPITAL_COMMUNITY): Payer: Commercial Managed Care - PPO | Admitting: Occupational Therapy

## 2018-03-14 ENCOUNTER — Inpatient Hospital Stay (HOSPITAL_COMMUNITY): Payer: Commercial Managed Care - PPO

## 2018-03-14 MED ORDER — AMLODIPINE BESYLATE 2.5 MG PO TABS
2.5000 mg | ORAL_TABLET | Freq: Every day | ORAL | Status: DC
  Administered 2018-03-14 – 2018-03-15 (×2): 2.5 mg via ORAL
  Filled 2018-03-14 (×2): qty 1

## 2018-03-14 NOTE — Discharge Summary (Signed)
Jesse Price, Jesse Price MEDICAL RECORD [id number] ACCOUNT [id number] DATE OF BIRTH:[birth year] FACILITY: [facility] LOCATION: [facility] PHYSICIAN:ANDREW Wynn Banker, MD  DISCHARGE SUMMARY  DATE OF DISCHARGE:  03/15/2018  DISCHARGE DIAGNOSES: 1.  Right frontal lobe infarction status post loop recorder. 2.  Subcutaneous Lovenox for deep venous thrombosis prophylaxis. 3.  Hypertension. 4.  Hyperlipidemia. 5.  Chronic kidney disease, stage II. 6.  Tobacco/alcohol use.  HOSPITAL COURSE:  This is a 52 year old right-handed male with history of hypertension, tobacco abuse.  CKD stage II.  Lives with his sister.  Independent prior to admission working full time.  Presented 03/05/2018 with left-sided weakness.  Cranial CT  scan showed cytotoxic edema in the right mid frontal lobe extending into the adjacent anterior superior right centrum semiovale.  Patient did not receive tPA.  MRI of the brain reviewed showing right frontal lobe infarction.  Per report, small acute  early subacute infarction within the right posterior medial frontal lobe.  No associated hemorrhage.  Multiple chronic infarcts within the bilateral frontal lobes, right parietal lobe and left posterior temporal lobe.  CT angiogram of head and neck with  no dissection or aneurysm.  Echocardiogram with ejection fraction of 60%, grade I diastolic dysfunction.  Neurology followup.  Maintained on aspirin for CVA prophylaxis.  Subcutaneous Lovenox for DVT prophylaxis.  TEE showed no evidence of thrombus.   Ejection fraction 55%, no wall motion abnormalities.  A loop recorder was placed per cardiology services 03/08/2018.  The patient was admitted for comprehensive rehabilitation program.  PAST MEDICAL HISTORY:  See discharge diagnoses.  SOCIAL HISTORY:  Lives with his sister.  Independent prior to admission.  FUNCTIONAL STATUS:  Upon admission to rehab services, moderate assist 20 feet 1 person handheld assist, minimal guard stand pivot  transfers, min mod assist with activities of daily living.  PHYSICAL EXAMINATION: VITAL SIGNS:  Blood pressure 126/89, pulse 70, temperature 98, respirations 18. GENERAL:  Alert male in no acute distress, made good eye contact with examiner.  Fair awareness of deficits. HEENT:  EOMs intact. NECK:  Supple, nontender, no JVD. CARDIOVASCULAR:  Rate controlled. ABDOMEN:  Soft, nontender, good bowel sounds. LUNGS:  Clear to auscultation without wheeze.  REHABILITATION HOSPITAL COURSE:  The patient was admitted to inpatient rehabilitation services.  Therapies initiated on a 3-hour daily basis, consisting of physical therapy, occupational therapy and rehabilitation nursing.  The following issues were  addressed during patient's rehabilitation stay:  Pertaining to the right frontal lobe infarction, remained stable.  He will continue on aspirin therapy status post loop recorder.  Follow up neurology services.    Subcutaneous Lovenox for DVT prophylaxis.  No bleeding episodes.    Blood pressure is controlled and monitored.  He would follow up with his primary MD.    Lipitor ongoing for hyperlipidemia.   CKD stage II, stable.  Follow up again primary MD.    Noted history of tobacco or alcohol use.  He was on a Nicoderm patch.  He received full counts regards to cessation of illicit drug products, alcohol and also no driving.    The patient received weekly collaborative interdisciplinary team conferences to discuss estimated length of stay, family teaching, any barriers to his discharge.  He can ambulate around the unit distant supervision without assistive device, performed  basic tasks while standing with supervision.  Close supervision for all balance activities.  Navigated stairs.  Gathered belongings for activities of daily living and homemaking.  He was able to dictate his care.  Full family teaching completed and  planned discharge to home.  DISCHARGE MEDICATIONS:  Included aspirin 325 mg  p.o. daily, Lipitor 80 mg p.o. daily, folic acid 1 mg daily, multivitamin daily, Nicoderm patch taper as directed.  DIET:  Regular.  The patient would follow up with Dr. Claudette Laws at the outpatient rehab center as directed; Dr. Delia Heady, call for appointment; Dr. Gilman Schmidt, cardiology services, Dr. Lavonia Drafts for medical management.  SPECIAL INSTRUCTIONS:  No driving, smoking or alcohol.  AN/NUANCE D:03/14/2018 T:03/14/2018 JOB:[id number]

## 2018-03-14 NOTE — Progress Notes (Addendum)
[facility] PHYSICAL MEDICINE & REHABILITATION PROGRESS NOTE   Subjective/Complaints:  No issues overnite  ROS no CP, SOB, N/V?D   Objective:   No results found. No results for input(s): WBC, HGB, HCT, PLT in the last 72 hours. No results for input(s): NA, K, CL, CO2, GLUCOSE, BUN, CREATININE, CALCIUM in the last 72 hours.  Intake/Output Summary (Last 24 hours) at 03/14/2018 0733 Last data filed at 03/13/2018 2300 Gross per 24 hour  Intake 1060 ml  Output 300 ml  Net 760 ml     Physical Exam: Vital Signs Blood pressure 140/89, pulse 70, temperature 97.8 F (36.6 C), temperature source Oral, resp. rate 19, height 5' 6.5" (1.689 m), weight 76.6 kg, SpO2 100 %.   General: No acute distress Mood and affect are appropriate Heart: Regular rate and rhythm no rubs murmurs or extra sounds Lungs: Clear to auscultation, breathing unlabored, no rales or wheezes Abdomen: Positive bowel sounds, soft nontender to palpation, nondistended Extremities: No clubbing, cyanosis, or edema Skin: No evidence of breakdown, no evidence of rash Neurologic: Cranial nerves II through XII intact, motor strength is 5/5 in R deltoid, bicep, tricep, grip, hip flexor, knee extensors, ankle dorsiflexor and plantar flexor, 3- Left delt 4/5 bi tri , 3- finger flex and ext, 5/5 LLE except 4 ankle DF/PF, toe flex/ext Sensory exam normal sensation to light touch and proprioception in bilateral upper and lower extremities Cerebellar exam normal finger to nose to finger as well as heel to shin in bilateral upper and lower extremities Musculoskeletal: Full range of motion in all 4 extremities. No joint swelling   Assessment/Plan: 1. Functional deficits secondary to Right  frontal infarct which require 3+ hours per day of interdisciplinary therapy in a comprehensive inpatient rehab setting.  Physiatrist is providing close team supervision and 24 hour management of active medical problems listed  below.  Physiatrist and rehab team continue to assess barriers to discharge/monitor patient progress toward functional and medical goals  Care Tool:  Bathing    Body parts bathed by patient: Right arm, Left upper leg, Right lower leg, Left arm, Chest, Front perineal area, Left lower leg, Abdomen, Face, Buttocks, Right upper leg         Bathing assist Assist Level: Supervision/Verbal cueing     Upper Body Dressing/Undressing Upper body dressing   What is the patient wearing?: Pull over shirt    Upper body assist Assist Level: Supervision/Verbal cueing    Lower Body Dressing/Undressing Lower body dressing      What is the patient wearing?: Pants     Lower body assist Assist for lower body dressing: Independent     Toileting Toileting    Toileting assist Assist for toileting: Supervision/Verbal cueing Assistive Device Comment: urinal   Transfers Chair/bed transfer  Transfers assist     Chair/bed transfer assist level: Supervision/Verbal cueing     Locomotion Ambulation   Ambulation assist      Assist level: Supervision/Verbal cueing Assistive device: Other (comment)(none) Max distance: 150'   Walk 10 feet activity   Assist     Assist level: Supervision/Verbal cueing Assistive device: Other (comment)(none)   Walk 50 feet activity   Assist    Assist level: Supervision/Verbal cueing Assistive device: Other (comment)(none)    Walk 150 feet activity   Assist    Assist level: Supervision/Verbal cueing Assistive device: Other (comment)(none)    Walk 10 feet on uneven surface  activity   Assist     Assist level: Contact Guard/Touching assist Assistive  device: Other (comment)(none)   Wheelchair     Assist Will patient use wheelchair at discharge?: No   Wheelchair activity did not occur: N/A         Wheelchair 50 feet with 2 turns activity    Assist    Wheelchair 50 feet with 2 turns activity did not occur:  N/A       Wheelchair 150 feet activity     Assist Wheelchair 150 feet activity did not occur: N/A        Medical Problem List and Plan: 1.  Left-sided weakness secondary to right frontal lobe infarction. Onset 03/05/18  Status post loop recorder- ASA 325mg  daily Plan D/C in am- no driving until MD f/u, est RTW 2-3 mo 2.  DVT Prophylaxis/Anticoagulation: Subcutaneous Lovenox.  Monitor for any bleeding episodes CBC normal 3. Pain Management: Tylenol as needed 4. Mood: Provide emotional support 5. Neuropsych: This patient is capable of making decisions on his own behalf. 6. Skin/Wound Care: Routine skin checks 7. Fluids/Electrolytes/Nutrition: Routine in and outs with follow-up chemistries 8.  Hypertension.  Vitals:   03/13/18 1927 03/14/18 0534  BP: 130/89 140/89  Pulse: 75 70  Resp: 18 19  Temp: 98 F (36.7 C) 97.8 F (36.6 C)  SpO2: 100% 100%  mild elevation of BP will resume low dose amlodipine 9.  Hyperlipidemia.  Lipitor 10.  CKD stage II. 11.  Tobacco and alcohol use.  Provide counseling    LOS: 5 days A FACE TO FACE EVALUATION WAS PERFORMED  Erick Colace 03/14/2018, 7:33 AM

## 2018-03-14 NOTE — Progress Notes (Signed)
Physical Therapy Session Note  Patient Details  Name: Jesse Price MRN: [id number] Date of Birth: 03/07/1966  Today's Date: 03/14/2018 PT Individual Time: 1330-1415 PT Individual Time Calculation (min): 45 min   Short Term Goals: Week 1:  PT Short Term Goal 1 (Week 1): =LTGs due to ELOS  Skilled Therapeutic Interventions/Progress Updates:    Patient received up in chair, pleasant and willing to work with PT today. Able to easily ambulate independently in hallway of unit without significant concern for gait deviation or loss of balance. Focused session on balance and multi-tasking based activities today, including cross-midline reaching in front of dynavision while in tandem stance on foam pad and SLS on solid surface, boxing combinations mixed with bob/weave for reaction time and dynamic balance training while moving forwards and backwards, and sidestepping and forward/backwards walking while tossing ball and participating in conversation with PT. Finished with high intensity interval training on Nustep with B UEs/LEs on level 8; performed 5 10-second high intensity sprinting sets with multiple rest breaks of 1-2 minutes between each sprinting set, patient able to reach 142SPM during sprint intervals. He ambulated back to his room with therapist and was left up in his recliner, all needs otherwise met this afternoon.   Therapy Documentation Precautions:  Precautions Precautions: Fall Restrictions Weight Bearing Restrictions: No General:   Pain: Pain Assessment Pain Scale: 0-10 Pain Score: 0-No pain    Therapy/Group: Individual Therapy  Kristen Unger PT, DPT, CBIS  Supplemental Physical Therapist [place]    Pager [phone] Acute Rehab Office [phone]   03/14/2018, 3:47 PM  

## 2018-03-14 NOTE — Progress Notes (Signed)
Occupational Therapy Session Note  Patient Details  Name: Jesse Price MRN: [id number] Date of Birth: 1965-06-06  Today's Date: 03/14/2018 OT Individual Time: 1000-1100 OT Individual Time Calculation (min): 60 min    Short Term Goals: Week 1:  OT Short Term Goal 1 (Week 1): STG= LTG d/t ELOS  Skilled Therapeutic Interventions/Progress Updates:    Pt seen this session to reassess his LUE [facility] and give home exercise recommendations.  Pt had completed shower and all self care on his own without A earlier.  Pt worked on [employer], manipulation of small objects and turning tennis ball in his hand. Pt provided with tennis ball and extra red foam handles for him to use with his utensils. Reviewed recommendations of no driving and for outpt OT.  Pt returned to room and independent sign placed on door.   Therapy Documentation Precautions:  Precautions Precautions: Fall Restrictions Weight Bearing Restrictions: No   Pain: Pain Assessment Pain Scale: 0-10 Pain Score: 0-No pain ADL: ADL Eating: Independent Grooming: Independent Where Assessed-Grooming: Edge of bed Upper Body Bathing: Independent Where Assessed-Upper Body Bathing: Shower Lower Body Bathing: Independent Where Assessed-Lower Body Bathing: Shower Upper Body Dressing: Independent Where Assessed-Upper Body Dressing: Edge of bed Lower Body Dressing: Independent Where Assessed-Lower Body Dressing: Edge of bed Toileting: Independent Where Assessed-Toileting: Teacher, adult education: Community education officer Method: Proofreader: Chiropractor Transfer: Designer, industrial/product Method: Nutritional therapist Transfer: Psychologist, counselling Method: Designer, industrial/product: Information systems manager with back ADL Comments: no DME needed Vision Baseline Vision/History: Wears glasses Patient Visual Report: Blurring of vision Ocular Range of Motion: Within  Functional Limits Tracking/Visual Pursuits: Decreased smoothness of vertical tracking;Unable to hold eye position out of midline;Other (comment)(unable to hold eyes out of midline in superior fields) Saccades: Within functional limits Convergence: Within functional limits Visual Fields: No apparent deficits Perception  Perception: Impaired Inattention/Neglect: (minimally impaired L side awareness in ambulation) Praxis Praxis: Intact Exercises:   Other Treatments:     Therapy/Group: Individual Therapy  SAGUIER,JULIA 03/14/2018, 1:18 PM

## 2018-03-14 NOTE — Progress Notes (Signed)
Occupational Therapy Discharge Summary  Patient Details  Name: [name] MRN: [id number] Date of Birth: 1966-04-02  Today's Date: 03/14/2018   Patient has met 40 of 11 long term goals due to improved activity tolerance, improved balance, ability to compensate for deficits, functional use of  LEFT upper and LEFT lower extremity, improved attention, improved awareness and improved coordination.  Patient to discharge at overall Independent level.  Patient's care partner is independent to provide the necessary physical assistance at discharge.    Reasons goals not met: n/a  Recommendation:  Patient will benefit from ongoing skilled OT services in outpatient setting to continue to advance functional skills in the area of iADL and Vocation.  Equipment: No equipment provided  Reasons for discharge: treatment goals met  Patient/family agrees with progress made and goals achieved: Yes  OT Discharge Precautions/Restrictions  Restrictions Weight Bearing Restrictions: No    Pain Pain Assessment Pain Scale: 0-10 Pain Score: 0-No pain ADL ADL Eating: Independent Grooming: Independent Where Assessed-Grooming: Edge of bed Upper Body Bathing: Independent Where Assessed-Upper Body Bathing: Shower Lower Body Bathing: Independent Where Assessed-Lower Body Bathing: Shower Upper Body Dressing: Independent Where Assessed-Upper Body Dressing: Edge of bed Lower Body Dressing: Independent Where Assessed-Lower Body Dressing: Edge of bed Toileting: Independent Where Assessed-Toileting: Glass blower/designer: Programmer, applications Method: Counselling psychologist: Ambulance person Transfer: Theatre stage manager Method: Wellsite geologist Transfer: IT consultant Method: Heritage manager: Civil engineer, contracting with back ADL Comments: no DME needed Vision Baseline Vision/History: Wears glasses Patient Visual  Report: Blurring of vision Ocular Range of Motion: Within Functional Limits Tracking/Visual Pursuits: Decreased smoothness of vertical tracking;Unable to hold eye position out of midline;Other (comment)(unable to hold eyes out of midline in superior fields) Saccades: Within functional limits Convergence: Within functional limits Visual Fields: No apparent deficits Perception  Perception: Impaired Inattention/Neglect: (minimally impaired L side awareness in ambulation) Praxis Praxis: Intact Cognition Overall Cognitive Status: Within Functional Limits for tasks assessed Orientation Level: Oriented X4 Sensation Coordination Coordination and Movement Description: L UE coordination impaired with FM tasks    box and block test = R side 39 sec, L side 34 seconds 9 Hole Peg Test: R side 30. 66 seconds, L side 1.24 seconds Motor  Motor Motor - Discharge Observations: mild L hemiparesis UE>LE; LUE 4+/5 Mobility    I without AD Trunk/Postural Assessment    WFL Balance  dynamic standing balance WFL Extremity/Trunk Assessment RUE Assessment RUE Assessment: Within Functional Limits LUE Assessment General Strength Comments: 4+/5, decreased [place];  L grasp = 105 lbs, R grasp = 100 lbs Brunstrum level for arm: Stage V Relative Independence from Synergy Brunstrum level for hand: Stage V Independence from basic synergies   Dominic Rhome 03/14/2018, 1:16 PM

## 2018-03-14 NOTE — Discharge Summary (Signed)
Discharge summary job [id number]

## 2018-03-15 DIAGNOSIS — R269 Unspecified abnormalities of gait and mobility: Secondary | ICD-10-CM

## 2018-03-15 DIAGNOSIS — I10 Essential (primary) hypertension: Secondary | ICD-10-CM

## 2018-03-15 DIAGNOSIS — I69398 Other sequelae of cerebral infarction: Secondary | ICD-10-CM

## 2018-03-15 MED ORDER — ACETAMINOPHEN 325 MG PO TABS: 325.0000 mg | ORAL_TABLET | ORAL | Status: DC | PRN

## 2018-03-15 MED ORDER — ATORVASTATIN CALCIUM 80 MG PO TABS: 80.0000 mg | ORAL_TABLET | Freq: Every day | ORAL | 0 refills | Status: DC

## 2018-03-15 MED ORDER — THIAMINE HCL 100 MG PO TABS: 100.0000 mg | ORAL_TABLET | Freq: Every day | ORAL | 0 refills | Status: DC

## 2018-03-15 MED ORDER — AMLODIPINE BESYLATE 2.5 MG PO TABS: 2.5000 mg | ORAL_TABLET | Freq: Every day | ORAL | 1 refills | Status: DC

## 2018-03-15 MED ORDER — FOLIC ACID 1 MG PO TABS: 1.0000 mg | ORAL_TABLET | Freq: Every day | ORAL | 0 refills | Status: DC

## 2018-03-15 MED ORDER — NICOTINE 21 MG/24HR TD PT24: MEDICATED_PATCH | TRANSDERMAL | 0 refills | Status: DC

## 2018-03-15 NOTE — Progress Notes (Signed)
Social Work Discharge Note  The overall goal for the admission was met for:   Discharge location: Yes  Length of Stay: Yes - 6 days  Discharge activity level: Yes - independent  Home/community participation: Yes  Services provided included: MD, RD, PT, OT, RN, Pharmacy and SW  Financial Services: Private Insurance: UMR  Follow-up services arranged: Outpatient: PT/OT at [facility] and Patient/Family has no preference for HH/DME agencies  Comments (or additional information): Pt feels ready to go home and did well on CIR.  Lives with his sister.  Patient/Family verbalized understanding of follow-up arrangements: Yes  Individual responsible for coordination of the follow-up plan: pt and his sister (for transportation)  Confirmed correct DME delivered: Trey Sailors 03/15/2018    Darald Uzzle, Silvestre Mesi

## 2018-03-15 NOTE — Progress Notes (Signed)
[place] PHYSICAL MEDICINE & REHABILITATION PROGRESS NOTE   Subjective/Complaints:  Discussed no driving and no return to work Reports hand strength is equal  ROS no CP, SOB, N/V?D   Objective:   No results found. No results for input(s): WBC, HGB, HCT, PLT in the last 72 hours. No results for input(s): NA, K, CL, CO2, GLUCOSE, BUN, CREATININE, CALCIUM in the last 72 hours.  Intake/Output Summary (Last 24 hours) at 03/15/2018 0745 Last data filed at 03/14/2018 1700 Gross per 24 hour  Intake 1080 ml  Output -  Net 1080 ml     Physical Exam: Vital Signs Blood pressure 123/75, pulse 66, temperature 98.5 F (36.9 C), temperature source Oral, resp. rate 18, height 5' 6.5" (1.689 m), weight 76.6 kg, SpO2 100 %.   General: No acute distress Mood and affect are appropriate Heart: Regular rate and rhythm no rubs murmurs or extra sounds Lungs: Clear to auscultation, breathing unlabored, no rales or wheezes Abdomen: Positive bowel sounds, soft nontender to palpation, nondistended Extremities: No clubbing, cyanosis, or edema Skin: No evidence of breakdown, no evidence of rash Neurologic: Cranial nerves II through XII intact, motor strength is 5/5 in R deltoid, bicep, tricep, grip, hip flexor, knee extensors, ankle dorsiflexor and plantar flexor, 3- Left delt 4/5 bi tri , 3- finger flex and ext, 5/5 LLE except 4 ankle DF/PF, toe flex/ext Sensory exam normal sensation to light touch and proprioception in bilateral upper and lower extremities Cerebellar exam normal finger to nose to finger as well as heel to shin in bilateral upper and lower extremities Musculoskeletal: Full range of motion in all 4 extremities. No joint swelling   Assessment/Plan: 1. Functional deficits secondary to Right  frontal infarct  Stable for D/C today F/u PCP in 3-4 weeks F/u PM&R 2 weeks See D/C summary See D/C instructions Care Tool:  Bathing    Body parts bathed by patient: Right arm, Left  upper leg, Right lower leg, Left arm, Chest, Front perineal area, Left lower leg, Abdomen, Face, Buttocks, Right upper leg         Bathing assist Assist Level: Independent     Upper Body Dressing/Undressing Upper body dressing   What is the patient wearing?: Pull over shirt    Upper body assist Assist Level: Independent    Lower Body Dressing/Undressing Lower body dressing      What is the patient wearing?: Underwear/pull up, Pants     Lower body assist Assist for lower body dressing: Independent     Toileting Toileting    Toileting assist Assist for toileting: Independent Assistive Device Comment: urinal   Transfers Chair/bed transfer  Transfers assist     Chair/bed transfer assist level: Independent     Locomotion Ambulation   Ambulation assist      Assist level: Independent Assistive device: Other (comment)(none) Max distance: 200   Walk 10 feet activity   Assist     Assist level: Independent Assistive device: Other (comment)(none )   Walk 50 feet activity   Assist    Assist level: Independent Assistive device: Other (comment)(none )    Walk 150 feet activity   Assist    Assist level: Independent Assistive device: Other (comment)(none )    Walk 10 feet on uneven surface  activity   Assist     Assist level: Independent Assistive device: Other (comment)(none)   Wheelchair     Assist Will patient use wheelchair at discharge?: No   Wheelchair activity did not occur: N/A  Wheelchair 50 feet with 2 turns activity    Assist    Wheelchair 50 feet with 2 turns activity did not occur: N/A       Wheelchair 150 feet activity     Assist Wheelchair 150 feet activity did not occur: N/A        Medical Problem List and Plan: 1.  Left-sided weakness secondary to right frontal lobe infarction. Onset 03/05/18  Status post loop recorder- ASA 325mg  daily Plan D/C today 2.  DVT  Prophylaxis/Anticoagulation: Subcutaneous Lovenox.  Monitor for any bleeding episodes CBC normal 3. Pain Management: Tylenol as needed 4. Mood: Provide emotional support 5. Neuropsych: This patient is capable of making decisions on his own behalf. 6. Skin/Wound Care: Routine skin checks 7. Fluids/Electrolytes/Nutrition: Routine in and outs with follow-up chemistries 8.  Hypertension.  Vitals:   03/14/18 1940 03/15/18 0457  BP: 119/90 123/75  Pulse: 74 66  Resp: 18 18  Temp: 98 F (36.7 C) 98.5 F (36.9 C)  SpO2: 100% 100%  controlled amlodipine 9.  Hyperlipidemia.  Lipitor 10.  CKD stage II. 11.  Tobacco and alcohol use.  Provide counseling    LOS: 6 days A FACE TO FACE EVALUATION WAS PERFORMED  Erick Colace 03/15/2018, 7:45 AM

## 2018-03-15 NOTE — Discharge Instructions (Signed)
Inpatient Rehab Discharge Instructions  Jesse Price Discharge date and time: No discharge date for patient encounter.   Activities/Precautions/ Functional Status: Activity: activity as tolerated Diet: regular diet Wound Care: none needed Functional status:  ___ No restrictions     ___ Walk up steps independently ___ 24/7 supervision/assistance   ___ Walk up steps with assistance ___ Intermittent supervision/assistance  ___ Bathe/dress independently ___ Walk with walker     _x__ Bathe/dress with assistance ___ Walk Independently    ___ Shower independently ___ Walk with assistance    ___ Shower with assistance ___ No alcohol     ___ Return to work/school ________    COMMUNITY REFERRALS UPON DISCHARGE:    Outpatient: PT     OT                   Agency:  [facility]   Phone: [phone]                Appointment Date/Time:  03/21/18 @ 9:30am and 10:15am  (please arrive by 9:15am)      Special Instructions: No driving, smoking or alcohol STROKE/TIA DISCHARGE INSTRUCTIONS SMOKING Cigarette smoking nearly doubles your risk of having a stroke & is the single most alterable risk factor  If you smoke or have smoked in the last 12 months, you are advised to quit smoking for your health.  Most of the excess cardiovascular risk related to smoking disappears within a year of stopping.  Ask you doctor about anti-smoking medications  [place] Quit Line: 1-800-QUIT NOW  Free Smoking Cessation Classes [phone]  CHOLESTEROL Know your levels; limit fat & cholesterol in your diet  Lipid Panel     Component Value Date/Time   CHOL 172 03/06/2018 0223   TRIG 100 03/06/2018 0223   HDL 77 03/06/2018 0223   CHOLHDL 2.2 03/06/2018 0223   VLDL 20 03/06/2018 0223   LDLCALC 75 03/06/2018 0223      Many patients benefit from treatment even if their cholesterol is at goal.  Goal: Total Cholesterol (CHOL) less than 160  Goal:  Triglycerides (TRIG) less than 150  Goal:  HDL  greater than 40  Goal:  LDL (LDLCALC) less than 100   BLOOD PRESSURE American Stroke Association blood pressure target is less that 120/80 mm/Hg  Your discharge blood pressure is:  BP: 103/67  Monitor your blood pressure  Limit your salt and alcohol intake  Many individuals will require more than one medication for high blood pressure  DIABETES (A1c is a blood sugar average for last 3 months) Goal HGBA1c is under 7% (HBGA1c is blood sugar average for last 3 months)  Diabetes: No known diagnosis of diabetes    Lab Results  Component Value Date   HGBA1C 5.6 03/06/2018     Your HGBA1c can be lowered with medications, healthy diet, and exercise.  Check your blood sugar as directed by your physician  Call your physician if you experience unexplained or low blood sugars.  PHYSICAL ACTIVITY/REHABILITATION Goal is 30 minutes at least 4 days per week  Activity: Increase activity slowly, Therapies: Physical Therapy: Home Health Return to work:   Activity decreases your risk of heart attack and stroke and makes your heart stronger.  It helps control your weight and blood pressure; helps you relax and can improve your mood.  Participate in a regular exercise program.  Talk with your doctor about the best form of exercise for you (dancing, walking, swimming, cycling).  DIET/WEIGHT Goal is  to maintain a healthy weight  Your discharge diet is:  Diet Order            Diet Heart Room service appropriate? Yes; Fluid consistency: Thin  Diet effective now              liquids Your height is:  Height: 5' 6.5" (168.9 cm) Your current weight is: Weight: 76.6 kg Your Body Mass Index (BMI) is:  BMI (Calculated): 26.85  Following the type of diet specifically designed for you will help prevent another stroke.  Your goal weight range is:    Your goal Body Mass Index (BMI) is 19-24.  Healthy food habits can help reduce 3 risk factors for stroke:  High cholesterol, hypertension, and excess  weight.  RESOURCES Stroke/Support Group:  Call [phone]   STROKE EDUCATION PROVIDED/REVIEWED AND GIVEN TO PATIENT Stroke warning signs and symptoms How to activate emergency medical system (call 911). Medications prescribed at discharge. Need for follow-up after discharge. Personal risk factors for stroke. Pneumonia vaccine given:  Flu vaccine given:  My questions have been answered, the writing is legible, and I understand these instructions.  I will adhere to these goals & educational materials that have been provided to me after my discharge from the hospital.      My questions have been answered and I understand these instructions. I will adhere to these goals and the provided educational materials after my discharge from the hospital.  Patient/Caregiver Signature _______________________________ Date __________  Clinician Signature _______________________________________ Date __________  Please bring this form and your medication list with you to all your follow-up doctor's appointments.

## 2018-03-15 NOTE — Progress Notes (Signed)
Patient received all the discharged instructions from Malissa Hippo PA,before he left the hospital.

## 2018-03-15 NOTE — Patient Care Conference (Signed)
Inpatient RehabilitationTeam Conference and Plan of Care Update Date: 03/15/2018   Time: 10:00 AM    Patient Name: Jesse Price      Medical Record Number: [id number]  Date of Birth: 1966/02/24 Sex: Male         Room/Bed: 4W18C/4W18C-01 Payor Info: Payor: Theme park manager / Plan: [employer] PPO / Product Type: *No Product type* /    Admitting Diagnosis: CVA  Admit Date/Time:  03/09/2018 12:40 PM Admission Comments: No comment available   Primary Diagnosis:  <principal problem not specified> Principal Problem: <principal problem not specified>  Patient Active Problem List   Diagnosis Date Noted  . Gait disturbance, post-stroke   . Embolic cerebral infarction ([place]) 03/09/2018  . Dyslipidemia   . Cerebrovascular accident (CVA) ([place])   . Left-sided weakness   . Benign essential HTN   . Tobacco abuse   . Diastolic dysfunction   . Leukocytosis   . Acute ischemic stroke ([place]) 03/05/2018  . CKD (chronic kidney disease), stage II 03/05/2018  . Alcohol dependence ([place]) 03/05/2018  . Essential hypertension 03/17/2017  . Abnormal kidney function 03/17/2017  . Hyperkalemia 03/17/2017  . Sleep disturbance 03/17/2017  . Sleep walking 03/17/2017  . Sebaceous cyst 03/17/2017  . Laceration of nose 03/17/2017    Expected Discharge Date: Expected Discharge Date: 03/15/18  Team Members Present: Physician leading conference: Dr. Alysia Penna Social Worker Present: Alfonse Alpers, LCSW Nurse Present: Blair Heys, RN PT Present: Michaelene Song, PT OT Present: Meriel Pica, OT     Current Status/Progress Goal Weekly Team Focus  Medical   Left UE dominanat, reduced strength and fine motor, BPs rising  maintain BP in desired range  D/C planning   Bowel/Bladder   continent of bladder and bowel  maintain continence level  stay continent of bladder and bowel   Swallow/Nutrition/ Hydration             ADL's   Independent with BADLs  Independent with BADLs  Pt ready for discharge,  recommend outpt OT to address LUE [facility] and L visual field awareness   Mobility   independent without AD up to 500 ft and 12 steps  independent without AD  L neuro re-ed, high level balance, d/c planning and education   Communication             Safety/Cognition/ Behavioral Observations            Pain   no complains of pain  monitor and adress the pain  monitor and keep the pain under controll   Skin   dry and intact, loop incision dry and intact  monitor the skin every shift  no breakdown during the hospitalization    Rehab Goals Patient on target to meet rehab goals: Yes Rehab Goals Revised: none *See Care Plan and progress notes for long and short-term goals.     Barriers to Discharge  Current Status/Progress Possible Resolutions Date Resolved   Physician    Medical stability     excellent progress toward Mod I goal  D/C home in am      Nursing                  PT                    OT                  SLP  SW                Discharge Planning/Teaching Needs:  Pt is ready to return to home, where he lives with his sister.  Pt is independent to direct his own care.   Team Discussion:  Pt progressed well on CIR and is ready for d/c home prior to his formal team conference scheduled tomorrow.  CSW met with members of the team and everyone feels pt has met mod I/Independent goals and is ready for d/c.  Pt to have f/u at [facility] and does not need any DME at home.  Revisions to Treatment Plan:  none    Continued Need for Acute Rehabilitation Level of Care: The patient requires daily medical management by a physician with specialized training in physical medicine and rehabilitation for the following conditions: Daily direction of a multidisciplinary physical rehabilitation program to ensure safe treatment while eliciting the highest outcome that is of practical value to the patient.: Yes Daily medical management of patient  stability for increased activity during participation in an intensive rehabilitation regime.: Yes Daily analysis of laboratory values and/or radiology reports with any subsequent need for medication adjustment of medical intervention for : Neurological problems;Blood pressure problems   I attest that I was present, lead the team conference, and concur with the assessment and plan of the team.   Kristie Bracewell, Silvestre Mesi 03/15/2018, 3:20 PM

## 2018-03-16 ENCOUNTER — Telehealth: Payer: Self-pay

## 2018-03-16 ENCOUNTER — Ambulatory Visit: Payer: Commercial Managed Care - PPO | Admitting: Physical Therapy

## 2018-03-16 ENCOUNTER — Encounter: Payer: Commercial Managed Care - PPO | Admitting: Occupational Therapy

## 2018-03-16 NOTE — Telephone Encounter (Signed)
Left pt a detailed message of the appt...with the time, date, address and phone number   Transitional Care call-who you talked with    1. Are you/is patient experiencing any problems since coming home? Are there any questions regarding any aspect of care? 2. Are there any questions regarding medications administration/dosing? Are meds being taken as prescribed? Patient should review meds with caller to confirm 3. Have there been any falls? 4. Has Home Health been to the house and/or have they contacted you? If not, have you tried to contact them? Can we help you contact them? 5. Are bowels and bladder emptying properly? Are there any unexpected incontinence issues? If applicable, is patient following bowel/bladder programs? 6. Any fevers, problems with breathing, unexpected pain? 7. Are there any skin problems or new areas of breakdown? 8. Has the patient/family member arranged specialty MD follow up (ie cardiology/neurology/renal/surgical/etc)?  Can we help arrange? 9. Does the patient need any other services or support that we can help arrange? 10. Are caregivers following through as expected in assisting the patient? 11. Has the patient quit smoking, drinking alcohol, or using drugs as recommended?  Appointment time, arrive time 10:00am for 10:20am appt on 03/25/18 with Jesse Price [address]

## 2018-03-17 NOTE — Telephone Encounter (Signed)
Transitional Care call Transitional Care Call Completed Transitional Care Call Questions Answered by Sister: Ms. Lovell Sheehan.   Patient name: Jesse Price DOB: 1966-04-16 1. Are you/is patient experiencing any problems since coming home? No a. Are there any questions regarding any aspect of care? No 2. Are there any questions regarding medications administration/dosing? No a. Are meds being taken as prescribed? Yes. b. "Patient should review meds with caller to confirm"  3. Have there been any falls? No 4. Has Home Health been to the house and/or have they contacted you? He has a scheduled appointment with Neuro Rehabilitation on 03/21/2018. a. If not, have you tried to contact them? NA b. Can we help you contact them? No 5. Are bowels and bladder emptying properly? Yes a. Are there any unexpected incontinence issues? No b. If applicable, is patient following bowel/bladder programs? NA 6. Any fevers, problems with breathing, unexpected pain? No 7. Are there any skin problems or new areas of breakdown? No 8. Has the patient/family member arranged specialty MD follow up (ie cardiology/neurology/renal/surgical/etc.)?  All scheduled appointments have been scheduled except Neurology, Ms. Lovell Sheehan will call for follow up appointment she states. a. Can we help arrange? NA 9. Does the patient need any other services or support that we can help arrange? No 10. Are caregivers following through as expected in assisting the patient? Yes. 11. Has the patient quit smoking, drinking alcohol, or using drugs as recommended? Ms. Lovell Sheehan reports [name] hasn't smoke or drank alcohol since discharge. He doesn't use illicit drugs.   Appointment date/time 03/24/2018  arrival time 10:00 for 10:20 appointment with Jacalyn Lefevre ANP. At [address] [facility] [address]

## 2018-03-17 NOTE — Telephone Encounter (Signed)
Placed a call to Mr. Lorenzo, no answer, left message to return the call. Placed a call to Ms. Lovell Sheehan and left message to call office.  This is the 2nd attempt.

## 2018-03-18 NOTE — CV Procedure (Signed)
   Transesophageal Echocardiogram  Indications: CVA  Time out performed Anesthesia provided sedation  Findings: - Left ventricle: Systolic function was normal. The estimated   ejection fraction was in the range of 50% to 55%. Wall motion was   normal; there were no regional wall motion abnormalities. - Mitral valve: There was mild regurgitation. - Left atrium: No evidence of thrombus in the atrial cavity or   appendage. - Right atrium: No evidence of thrombus in the atrial cavity or   appendage. - Atrial septum: Echo contrast study showed no right-to-left atrial   level shunt, at baseline or with provocation. No embolic source.  Please see TEE result under result review for further details.   Donato Schultz, MD

## 2018-03-21 ENCOUNTER — Other Ambulatory Visit: Payer: Self-pay

## 2018-03-21 ENCOUNTER — Encounter: Payer: Self-pay | Admitting: Rehabilitative and Restorative Service Providers"

## 2018-03-21 ENCOUNTER — Encounter: Payer: Self-pay | Admitting: Occupational Therapy

## 2018-03-21 ENCOUNTER — Ambulatory Visit: Payer: Commercial Managed Care - PPO | Attending: Medical | Admitting: Rehabilitative and Restorative Service Providers"

## 2018-03-21 ENCOUNTER — Telehealth: Payer: Self-pay | Admitting: Occupational Therapy

## 2018-03-21 ENCOUNTER — Ambulatory Visit: Payer: Commercial Managed Care - PPO | Admitting: Occupational Therapy

## 2018-03-21 VITALS — BP 177/112

## 2018-03-21 DIAGNOSIS — R2689 Other abnormalities of gait and mobility: Secondary | ICD-10-CM | POA: Insufficient documentation

## 2018-03-21 DIAGNOSIS — R482 Apraxia: Secondary | ICD-10-CM

## 2018-03-21 DIAGNOSIS — R278 Other lack of coordination: Secondary | ICD-10-CM | POA: Insufficient documentation

## 2018-03-21 DIAGNOSIS — R41841 Cognitive communication deficit: Secondary | ICD-10-CM | POA: Diagnosis present

## 2018-03-21 DIAGNOSIS — R41842 Visuospatial deficit: Secondary | ICD-10-CM

## 2018-03-21 DIAGNOSIS — I69818 Other symptoms and signs involving cognitive functions following other cerebrovascular disease: Secondary | ICD-10-CM

## 2018-03-21 DIAGNOSIS — I63421 Cerebral infarction due to embolism of right anterior cerebral artery: Secondary | ICD-10-CM

## 2018-03-21 NOTE — Therapy (Signed)
[facility] [address] [place], [place], [address] Phone: [phone]   Fax:  [phone]  Occupational Therapy Evaluation  Patient Details  Name: Jesse Price MRN: [id number] Date of Birth: [birth year] Referring Provider (OT): Dr. Claudette Laws   Encounter Date: 03/21/2018  OT End of Session - 03/21/18 1146    Visit Number  1    Number of Visits  8    Date for OT Re-Evaluation  04/18/18    Authorization Type  [employer]    Authorization Time Period  20 visit limit pt has used 0/20 before today    OT Start Time  0925    OT Stop Time  1015    OT Time Calculation (min)  50 min    Activity Tolerance  Patient tolerated treatment well       Past Medical History:  Diagnosis Date  . Asthma   . Hypertension     Past Surgical History:  Procedure Laterality Date  . LOOP RECORDER INSERTION N/A 03/08/2018   Procedure: LOOP RECORDER INSERTION;  Surgeon: Marinus Maw, MD;  Location: [facility] INVASIVE CV LAB;  Service: Cardiovascular;  Laterality: N/A;  . TEE WITHOUT CARDIOVERSION N/A 03/08/2018   Procedure: TRANSESOPHAGEAL ECHOCARDIOGRAM (TEE);  Surgeon: Jake Bathe, MD;  Location: [facility] ENDOSCOPY;  Service: Cardiovascular;  Laterality: N/A;    There were no vitals filed for this visit.  Subjective Assessment - 03/21/18 0929    Subjective   Sometimes my vision gets a little blurry    Pertinent History  PMH:  Pt with R frontal CVA on 03/05/2018. Inpt rehab d/c home on 03/15/2018  multiple chronic infarcts bilaterally, tobacco, +ETOH use, HTN, HLD, chronic kidney disease Stage II    Patient Stated Goals  to get my hand working properly - I am left handed.     Currently in Pain?  No/denies        [facility] OT Assessment - 03/21/18 0001      Assessment   Medical Diagnosis  L frontal CVA    Referring Provider (OT)  Dr. Claudette Laws    Onset Date/Surgical Date  03/05/18    Hand Dominance  Left    Prior  Therapy  inpt rehab PT and OT      Precautions   Precautions  Other (comment)    Precaution Comments  No driving - pt drove today and reiterated with pt that he has not been cleared to drive at this time and will need medical clearance - pt verbalized understanding and stated sister can drive him.      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    Leisure  watch football  ADL   Eating/Feeding  Modified independent   increased time and difficult   Grooming  Modified independent   increased time   Upper Body Bathing  Independent    Lower Body Bathing  Independent    Upper Body Dressing  Increased time   buttons, tie shoes   Lower Body Dressing  Increased time   buttons, tying shoes   Toilet Transfer  Independent    Toileting - Clothing Manipulation  Modified independent    Toileting -  Hygiene  Modified Independent    Tub/Shower Transfer  Independent      IADL   Shopping  Takes care of all shopping needs independently;Assistance for transportation   pt will need assist with driving until cleared   Light Housekeeping  Performs light daily tasks such as dishwashing, bed making   laundry, getting simple meals   Meal Prep  Able to complete simple cold meal and snack prep   "I eat out alot I didn't cook before"   Data processing manager  Relies on family or friends for transportation    Medication  Management  Is responsible for taking medication in correct dosages at correct time    Physicist, medical financial matters independently (budgets, writes checks, pays rent, bills goes to bank), collects and keeps track of income   per pt     Mobility   Mobility Status  Independent      Written Expression   Dominant Hand  Left      Vision - History   Baseline Vision  Wears glasses all the time    Additional Comments  Pt reports that his vision is more blurry now      Vision Assessment   Eye Alignment  Impaired (comment)   mild   Ocular Range of Motion  Within Functional Limits    Tracking/Visual Pursuits  Able to track stimulus in all quads without difficulty    Convergence  Within functional limits    Visual Fields  Left visual field deficit    Comment  Pt with decreased gaze stabilization with horizontal head turns and becomes mildly symptomatic with saccadic movements, horizontal head turns (4/10) resolves to 2/10 in 20 seconds.       Activity Tolerance   Activity Tolerance  Tolerate 30+ min activity without fatigue      Cognition   Overall Cognitive Status  Impaired/Different from baseline    Mini Mental State Exam   Pt reports difficulty with concentration and this is supported by acute ST evaluation.  Will continue to assess via functional tasks.  Will recommmend oupt ST eval    Memory  Impaired    Problem Solving  Impaired    MOCA  17/30      Sensation   Light Touch  Appears Intact    Hot/Cold  Appears Intact    Proprioception  Appears Intact      Coordination   Gross Motor Movements are Fluid and Coordinated  Yes    Fine Motor Movements are Fluid and Coordinated  No    9 Hole Peg Test  Right;Left    Right 9 Hole Peg Test  23.77    Left 9 Hole Peg Test  45.63      Praxis   Praxis  --   to be further assessed relative to LUE use     Tone   Assessment Location  Left Upper Extremity      ROM / Strength   AROM / PROM /  Strength  AROM;Strength       AROM   Overall AROM   Within functional limits for tasks performed    Overall AROM Comments  BUE's      Strength   Overall Strength  Within functional limits for tasks performed    Overall Strength Comments  BUE's      Hand Function   Right Hand Gross Grasp  Functional    Right Hand Grip (lbs)  100    Left Hand Gross Grasp  Functional    Left Hand Grip (lbs)  120      LUE Tone   LUE Tone  Within Functional Limits                           OT Long Term Goals - 03/21/18 1137      OT LONG TERM GOAL #1   Title  Pt will be mod I with HEP for coordination, visual vestibular exercises - 04/18/2018    Status  New      OT LONG TERM GOAL #2   Title  Pt will demonstrate improved coordination as evidenced by decreasing time on 9hole peg test for LUE by at least 8 seconds (baseline=45.63) to assist with ADL and IADL tasks    Status  New      OT LONG TERM GOAL #3   Title  Pt will report no greater than 3/10 intensity for visual vestibular symptoms with horizontal head turns during home mgmt tasks.     Status  New      OT LONG TERM GOAL #4   Title  Pt will report greater ease in cutting food, writing and tying shoes    Status  New      OT LONG TERM GOAL #5   Title  Pt will demonstrate environmental scanning during functional task with 90% accuracy for home mgmt and shopping tasks.     Status  New            Plan - 03/21/18 1139    Clinical Impression Statement  Pt is a 52 year old male s/p R frontal CVA on 03/05/2018. Pt was discharged home on 03/15/2018 after stay in inpt rehab.  Pt presents today with the following deficits that impact independence:  cognitive impairment, impaired coordination LUE, possible motor planning for LUE, visual vestibular integration deficits, L field cut.  Pt will benefit from skilled OT to address these deficits to maximize independence. Pt would also benefit from ST eval and pt in agreement - will contact MD for order     Occupational Profile and client history currently impacting functional performance  PMH:  HTN, HLD, chronic kidney disease Stage II, tobacoo and alcohol use.  Multiple chronic brain infarcts, bilaterally.    Occupational performance deficits (Please refer to evaluation for details):  ADL's;IADL's;Work    Rehab Potential  Good   for goals   OT Frequency  2x / week    OT Duration  4 weeks    OT Treatment/Interventions  Self-care/ADL training;Therapeutic exercise;Neuromuscular education;DME and/or AE instruction;Therapeutic activities;Visual/perceptual remediation/compensation;Patient/family education    Plan  initiate HEP for coordination as well as visual vestibular issues    Clinical Decision Making  Limited treatment options, no task modification necessary    Consulted and Agree with Plan of Care  Patient       Patient will benefit from skilled therapeutic intervention in order to improve the following deficits and impairments:  Decreased  cognition, Decreased coordination, Decreased safety awareness, Impaired UE functional use, Impaired vision/preception  Visit Diagnosis: Other lack of coordination - Plan: Ot plan of care cert/re-cert  Visuospatial deficit - Plan: Ot plan of care cert/re-cert  Apraxia - Plan: Ot plan of care cert/re-cert  Other symptoms and signs involving cognitive functions following other cerebrovascular disease - Plan: Ot plan of care cert/re-cert    Problem List Patient Active Problem List   Diagnosis Date Noted  . Gait disturbance, post-stroke   . Embolic cerebral infarction (HCC) 03/09/2018  . Dyslipidemia   . Cerebrovascular accident (CVA) (HCC)   . Left-sided weakness   . Benign essential HTN   . Tobacco abuse   . Diastolic dysfunction   . Leukocytosis   . Acute ischemic stroke (HCC) 03/05/2018  . CKD (chronic kidney disease), stage II 03/05/2018  . Alcohol dependence (HCC) 03/05/2018  . Essential hypertension 03/17/2017  . Abnormal kidney function  03/17/2017  . Hyperkalemia 03/17/2017  . Sleep disturbance 03/17/2017  . Sleep walking 03/17/2017  . Sebaceous cyst 03/17/2017  . Laceration of nose 03/17/2017    Norton Pastel, OTR/L 03/21/2018, 12:55 PM  [facility] [address] [place], [place], [address] Phone: [phone]   Fax:  [phone]  Name: Huck Ashworth MRN: [id number] Date of Birth: Sep 14, 1965

## 2018-03-21 NOTE — Therapy (Signed)
[facility] [address] [place], [place], [address] Phone: [phone]   Fax:  [phone]  Physical Therapy Evaluation  Patient Details  Name: Jesse Price MRN: [id number] Date of Birth: Sep 29, 1972 Referring Provider (PT): Claudette Laws, MD   Encounter Date: 03/21/2018  PT End of Session - 03/21/18 1030    Visit Number  1    Number of Visits  1    Authorization Type  [employer] medicare    PT Start Time  1018    PT Stop Time  1053    PT Time Calculation (min)  35 min    Activity Tolerance  Patient tolerated treatment well    Behavior During Therapy  [facility] for tasks assessed/performed       Past Medical History:  Diagnosis Date  . Asthma   . Hypertension     Past Surgical History:  Procedure Laterality Date  . LOOP RECORDER INSERTION N/A 03/08/2018   Procedure: LOOP RECORDER INSERTION;  Surgeon: Marinus Maw, MD;  Location: [facility] INVASIVE CV LAB;  Service: Cardiovascular;  Laterality: N/A;  . TEE WITHOUT CARDIOVERSION N/A 03/08/2018   Procedure: TRANSESOPHAGEAL ECHOCARDIOGRAM (TEE);  Surgeon: Jake Bathe, MD;  Location: [facility] ENDOSCOPY;  Service: Cardiovascular;  Laterality: N/A;    Vitals:   03/21/18 1049 03/21/18 1050  BP: (!) 166/102 (!) 177/112     Subjective Assessment - 03/21/18 1020    Subjective  "I'm doing well with my leg.  I have some coordination problem with my hand."  He notes he also has some dizziness.  He notes he is independent since d/c home from IP rehab.      Pertinent History  hyperlipidemia, HTN, chronic kidney disease, loop recorder placement    Patient Stated Goals  Improve functionof the left hand.      Currently in Pain?  No/denies         [facility] PT Assessment - 03/21/18 1023      Assessment   Medical Diagnosis  R frontal CVA    Referring Provider (PT)  Claudette Laws, MD    Onset Date/Surgical Date  03/04/18    Hand Dominance  Left    Prior Therapy  inpatient PT and  OT      Precautions   Precautions  Other (comment)    Precaution Comments  no driving; currently out of work      Restrictions   Weight Bearing Restrictions  No      Balance Screen   Has the patient fallen in the past 6 months  Yes    How many times?  1- fell at time of stroke    Has the patient had a decrease in activity level because of a fear of falling?   No    Is the patient reluctant to leave their home because of a fear of falling?   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  patient able to correct posaition when prompted      Bed Mobility   Rolling Right  Independent    Rolling Left  Independent    Supine to Sit  Independent    Sit to Supine  Independent      Transfers   Transfers  Sit to Stand;Stand to Sit    Sit to Stand  7: Independent;Without upper extremity assist      Ambulation/Gait   Ambulation/Gait  Yes    Ambulation/Gait Assistance  7: Independent    Ambulation Distance (Feet)  200 Feet    Assistive device  None    Gait Pattern  Within Functional Limits    Ambulation Surface  Level;Indoor    Gait velocity  3.52 ft/sec    Stairs  Yes    Stairs Assistance  7: Independent    Stair Management Technique  No rails;Alternating pattern    Gait Comments  Dynamic gait on heels/toes with mild inversion noted at the L ankle (not at risk for rolling ankle per observation).         Standardized Balance Assessment   Standardized Balance Assessment  Berg Balance Test      Berg Balance Test   Sit to Stand  Able to stand without using hands and stabilize independently    Standing Unsupported  Able to stand safely 2 minutes    Sitting with Back Unsupported but Feet Supported on Floor or Stool  Able to sit safely and securely 2 minutes    Stand to Sit  Sits safely with minimal use of hands    Transfers  Able to transfer safely, minor use of hands    Standing Unsupported with Eyes Closed  Able to stand 10 seconds safely    Standing Ubsupported with Feet Together  Able to place feet together independently and stand 1 minute safely    From Standing, Reach Forward with Outstretched Arm  Can reach confidently >25 cm (10")    From Standing Position, Pick up Object from Floor  Able to pick up shoe safely and easily    From Standing Position, Turn to Look Behind Over each Shoulder  Looks behind from both sides and weight shifts well    Turn 360 Degrees  Able to turn 360 degrees safely in 4 seconds or less    Standing Unsupported, Alternately Place Feet on Step/Stool  Able to stand independently and safely and complete 8 steps in 20 seconds    Standing Unsupported, One Foot in Front  Able to place foot tandem independently and hold 30 seconds    Standing on One Leg  Able to lift leg independently and hold > 10 seconds    Total Score  56      Functional Gait  Assessment   Gait assessed   Yes    Gait Level Surface  Walks 20 ft in less than 5.5 sec, no assistive devices, good speed, no evidence for imbalance, normal gait pattern, deviates no more than 6 in outside of the 12 in walkway width.    Change in Gait Speed  Able to smoothly change walking speed without loss of balance or gait deviation. Deviate no more than 6 in outside of the 12 in walkway width.  Gait with Horizontal Head Turns  Performs head turns smoothly with no change in gait. Deviates no more than 6 in outside 12 in  walkway width    Gait with Vertical Head Turns  Performs head turns with no change in gait. Deviates no more than 6 in outside 12 in walkway width.    Gait and Pivot Turn  Pivot turns safely within 3 sec and stops quickly with no loss of balance.    Step Over Obstacle  Is able to step over 2 stacked shoe boxes taped together (9 in total height) without changing gait speed. No evidence of imbalance.    Gait with Narrow Base of Support  Is able to ambulate for 10 steps heel to toe with no staggering.    Gait with Eyes Closed  Walks 20 ft, uses assistive device, slower speed, mild gait deviations, deviates 6-10 in outside 12 in walkway width. Ambulates 20 ft in less than 9 sec but greater than 7 sec.    Ambulating Backwards  Walks 20 ft, no assistive devices, good speed, no evidence for imbalance, normal gait    Steps  Alternating feet, no rail.    Total Score  29    FGA comment:  29/30           Vestibular Assessment - 03/21/18 1048      Vestibular Assessment   General Observation  Patient notes mild symptoms with quick head turns and gaze activities.   See OT note for oculomotor examination.  The patient reports mild sense of lightheadedness, denies true dizziness.          Objective measurements completed on examination: See above findings.                           Plan - 03/21/18 1056    Clinical Impression Statement  The patient is a 52 yo male presenting to OP physical therapy functionally independent with mobility and balance.  He has mild inversion of the left ankle with higher level tasks of heel/toe walking and some "lightheaded" sensation with quick head movements.  Patient feels symptoms are improving quickly noting no difficulties with daily tasks.  No further PT intervention indicated at this time.     Clinical Presentation  Stable    Clinical Decision Making  Low    Rehab Potential  Good    PT Frequency  1x / week    PT Next Visit Plan  n/a     Consulted and Agree with Plan of Care  Patient       Patient will benefit from skilled therapeutic intervention in order to improve the following deficits and impairments:  Abnormal gait  Visit Diagnosis: Other abnormalities of gait and mobility     Problem List Patient Active Problem List   Diagnosis Date Noted  . Gait disturbance, post-stroke   . Embolic cerebral infarction (HCC) 03/09/2018  . Dyslipidemia   . Cerebrovascular accident (CVA) (HCC)   . Left-sided weakness   . Benign essential HTN   . Tobacco abuse   . Diastolic dysfunction   . Leukocytosis   . Acute ischemic stroke (HCC) 03/05/2018  . CKD (chronic kidney disease), stage II 03/05/2018  . Alcohol dependence (HCC) 03/05/2018  . Essential hypertension 03/17/2017  . Abnormal kidney function 03/17/2017  . Hyperkalemia 03/17/2017  . Sleep disturbance 03/17/2017  . Sleep walking 03/17/2017  . Sebaceous cyst 03/17/2017  . Laceration of nose 03/17/2017  Kenndra Morris, PT 03/21/2018, 10:59 AM  [facility] [address] [place], [place], [address] Phone: [phone]   Fax:  [phone]  Name: Jayston Trevino MRN: [id number] Date of Birth: 03/22/1966

## 2018-03-21 NOTE — Telephone Encounter (Signed)
Dr. Wynn Banker, I evaluated this patient today. He is experiencing worsening cognitive deficits - this was well documented in acute ST note as well as inpt OT note.  Could you please order ST eval and tx?  Thanks Mackie Pai, OTR/L

## 2018-03-22 ENCOUNTER — Ambulatory Visit: Payer: Commercial Managed Care - PPO | Admitting: Medical

## 2018-03-22 ENCOUNTER — Inpatient Hospital Stay: Payer: PRIVATE HEALTH INSURANCE | Admitting: Medical

## 2018-03-22 VITALS — BP 146/90 | HR 67 | Temp 98.2°F | Resp 16 | Ht 66.5 in | Wt 178.8 lb

## 2018-03-22 DIAGNOSIS — E785 Hyperlipidemia, unspecified: Secondary | ICD-10-CM

## 2018-03-22 DIAGNOSIS — R269 Unspecified abnormalities of gait and mobility: Secondary | ICD-10-CM

## 2018-03-22 DIAGNOSIS — I10 Essential (primary) hypertension: Secondary | ICD-10-CM

## 2018-03-22 DIAGNOSIS — I63421 Cerebral infarction due to embolism of right anterior cerebral artery: Secondary | ICD-10-CM | POA: Diagnosis not present

## 2018-03-22 DIAGNOSIS — I639 Cerebral infarction, unspecified: Secondary | ICD-10-CM

## 2018-03-22 DIAGNOSIS — I69398 Other sequelae of cerebral infarction: Secondary | ICD-10-CM

## 2018-03-22 DIAGNOSIS — R531 Weakness: Secondary | ICD-10-CM

## 2018-03-22 DIAGNOSIS — F1020 Alcohol dependence, uncomplicated: Secondary | ICD-10-CM

## 2018-03-22 DIAGNOSIS — N182 Chronic kidney disease, stage 2 (mild): Secondary | ICD-10-CM

## 2018-03-22 DIAGNOSIS — N289 Disorder of kidney and ureter, unspecified: Secondary | ICD-10-CM | POA: Diagnosis not present

## 2018-03-22 DIAGNOSIS — Z87891 Personal history of nicotine dependence: Secondary | ICD-10-CM

## 2018-03-22 MED ORDER — ADULT MULTIVITAMIN W/MINERALS CH: 1.0000 | ORAL_TABLET | Freq: Every day | ORAL | 2 refills | Status: DC

## 2018-03-22 MED ORDER — THIAMINE HCL 100 MG PO TABS: 100.0000 mg | ORAL_TABLET | Freq: Every day | ORAL | 2 refills | Status: DC

## 2018-03-22 MED ORDER — FOLIC ACID 1 MG PO TABS: 1.0000 mg | ORAL_TABLET | Freq: Every day | ORAL | 2 refills | Status: DC

## 2018-03-22 MED ORDER — ASPIRIN 325 MG PO TABS: 325.0000 mg | ORAL_TABLET | Freq: Every day | ORAL | 2 refills | Status: DC

## 2018-03-22 MED ORDER — ATORVASTATIN CALCIUM 80 MG PO TABS: 80.0000 mg | ORAL_TABLET | Freq: Every day | ORAL | 2 refills | Status: DC

## 2018-03-22 MED ORDER — AMLODIPINE BESYLATE 5 MG PO TABS: 5.0000 mg | ORAL_TABLET | Freq: Every day | ORAL | 2 refills | Status: DC

## 2018-03-22 NOTE — Progress Notes (Signed)
Subjective: Chief Complaint  Patient presents with  . Hospital FU    hospital follow up   Here for hospital f/u.  He was recently hospitalized 03/06/2018 through 03/15/2018  Acute symptoms initially included left-sided weakness, and otherwise ambulatory male prior.  He notes that he did not receive medical attention until it day after symptoms given that he was by himself at home and sister found him the next day.  He was diagnosed with a right frontal lobe infarction, hypertension, hyperlipidemia, tobacco use, chronic kidney disease  Since discharge he has regained quite a bit of strength in his left arm and leg although he is still having problems with fine motor with his left hand.  He is still having some blurred vision on and off.  He has quit tobacco since the hospitalization  He is taking blood pressure medication, aspirin, cholesterol medicine but not thiamine or folate as listed by the hospital discharge paperwork  He does have a blood pressure cuff and checking blood pressures at home.  He is getting numbers around 120/79, blood pressure 124/84.  He lives with his sister  He still drinks several alcoholic beverages per week  He denies any new symptoms since discharge    Past Medical History:  Diagnosis Date  . Asthma   . Hypertension    Current Outpatient Medications on File Prior to Visit  Medication Sig Dispense Refill  . acetaminophen (TYLENOL) 325 MG tablet Take 1-2 tablets (325-650 mg total) by mouth every 4 (four) hours as needed for mild pain (temp > 37.5 C (99.5 F)). (Patient not taking: Reported on 03/22/2018)    . nicotine (NICODERM CQ - DOSED IN MG/24 HOURS) 21 mg/24hr patch 21 mg patch daily 2 weeks then 14 mg patch daily 3 weeks then 7 mg patch daily 3 weeks and stop (Patient not taking: Reported on 03/21/2018) 28 patch 0   No current facility-administered medications on file prior to visit.    ROS as in subjective   Objective: BP (!) 146/90    Pulse 67   Temp 98.2 F (36.8 C) (Oral)   Resp 16   Ht 5' 6.5" (1.689 m)   Wt 178 lb 12.8 oz (81.1 kg)   SpO2 98%   BMI 28.43 kg/m   BP Readings from Last 3 Encounters:  03/22/18 (!) 146/90  03/21/18 (!) 177/112  03/15/18 123/75     General appearance: alert, no distress, WD/WN, AA male HEENT: normocephalic, sclerae anicteric, PERRLA, EOMi, nares patent, no discharge or erythema, pharynx normal Oral cavity: MMM, no lesions Neck: supple, no lymphadenopathy, no thyromegaly, no masses, no bruits Heart: RRR, normal S1, S2, no murmurs Lungs: CTA bilaterally, no wheezes, rhonchi, or rales Musculoskeletal: nontender, no swelling, no obvious deformity Extremities: no edema, no cyanosis, no clubbing Pulses: 2+ symmetric, upper and lower extremities, normal cap refill Neurological: alert, oriented x 3, CN2-12 intact, left arm /hand grip and finger strength is reduced and somewhat discoordinated with thumb to finger opposition, arm and leg strength in general 4-5/5 compared to 5/5 right side, otherwise strength normal upper extremities and lower extremities, sensation normal throughout, DTRs 2+ throughout, no cerebellar signs, gait normal Psychiatric: normal affect, behavior normal, pleasant      Assessment: Encounter Diagnoses  Name Primary?  . Abnormal kidney function Yes  . Essential hypertension   . Acute ischemic stroke (HCC)   . 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 Left-sided weakness   . CKD (chronic  kidney disease), stage II   . Uncomplicated alcohol dependence (HCC)   . Dyslipidemia   . Gait disturbance, post-stroke   . Former smoker      Plan: I reviewed his recent discharge summary paperwork, imaging, labs, EKG.  I reviewed the medication reconciliation.  He quit tobacco with this hospitalization  We discussed the risk of additional stroke or cardiac events in the short-term  Advised to limit alcohol, make diet changes  eating more low-fat heart healthy diet.  Can walk for exercise.  He is doing physical therapy and occupational therapy and already seeing improvements in left-sided strength  He has follow-up planned with cardiology and neurology.  We will refer to eye doctor for blurred vision and routine care  He has made arrangements with employer where he will not go back to work until after the first of the year.  He works as a Psychologist, occupational.   We discussed the following recommendations, and handout given    Recommendations  Follow-up with cardiology and neurology as planned  Continue your physical therapy and Occupational Therapy to help restore strength in your hand and extremities.  It sounds like you are on a good start with this already  Continue your current medicines as below or as recommended by the hospital:  Amlodipine, 1 tablet daily for blood pressure, but I want to change this to 5 mg daily  Lipitor 80 mg daily to lower cholesterol and lower risk of future stroke or heart attack  Continue baby aspirin 81 mg daily for prevention  I also recommend you take folic acid 1 mg daily and thiamine 100 mg daily.  Folic acid and thiamine are to help brain function in light of the recent stroke and excessive alcohol use  I am glad that you quit smoking, and I recommend you significantly limit your alcohol intake  We will refer you to an eye doctor for consult  In the coming weeks if you have a repeat of your initial symptoms such as one-sided weakness numbness or tingling or sudden loss of vision or hearing or slurred speech, then call 911  I recommend exercising most days of the week with walking  I recommend a healthy diet.    Do's:   whole grains such as whole grain pasta, rice, whole grains breads and whole grain cereals.  Use small quantities such as 1/2 cup per serving or 2 slices of bread per serving.    Eat 3-5 fruits daily  Eat beans at least once daily  Eat almonds in small  quantities at least 3 days per week    If they eat meat, I recommend small portions of lean meats such as chicken, fish, and [place].  Eat as much NON corn and NON potato vegetables as they like, particularly raw or steamed  Drink several large glasses of water daily  Cautions:  Limit red meat  Limit corn and potatoes  Limit sweets, cake, pie, candy  Limit beer and alcohol  Avoid fried food, fast food, large portions  Avoid sugary drinks such as regular soda and sweet tea  Otherwise follow-up with me in a month fasting  Barnell was seen today for hospital fu.  Diagnoses and all orders for this visit:  Abnormal kidney function -     Multiple Vitamin (MULTIVITAMIN WITH MINERALS) TABS tablet; Take 1 tablet by mouth daily.  Essential hypertension -     Multiple Vitamin (MULTIVITAMIN WITH MINERALS) TABS tablet; Take 1 tablet by mouth daily.  Acute  ischemic stroke (HCC)  Cerebral infarction due to embolism of right anterior cerebral artery (HCC)  Left-sided weakness  CKD (chronic kidney disease), stage II  Uncomplicated alcohol dependence (HCC)  Dyslipidemia  Gait disturbance, post-stroke  Former smoker  Other orders -     amLODipine (NORVASC) 5 MG tablet; Take 1 tablet (5 mg total) by mouth daily. -     aspirin 325 MG tablet; Take 1 tablet (325 mg total) by mouth daily. -     folic acid (FOLVITE) 1 MG tablet; Take 1 tablet (1 mg total) by mouth daily. -     thiamine 100 MG tablet; Take 1 tablet (100 mg total) by mouth daily. -     Discontinue: Multiple Vitamin (MULTIVITAMIN WITH MINERALS) TABS tablet; Take 1 tablet by mouth daily. -     atorvastatin (LIPITOR) 80 MG tablet; Take 1 tablet (80 mg total) by mouth daily at 6 PM.

## 2018-03-22 NOTE — Patient Instructions (Addendum)
   Recommendations  Follow-up with cardiology and neurology as planned  Continue your physical therapy and Occupational Therapy to help restore strength in your hand and extremities.  It sounds like you are on a good start with this already  Continue your current medicines as below or as recommended by the hospital:  Amlodipine 5mg , 1 tablet daily for blood pressure, but I want to change this to 5 mg daily  Lipitor 80 mg daily to lower cholesterol and lower risk of future stroke or heart attack  Continue baby aspirin 325mg  daily for prevention  I also recommend you take folic acid 1 mg daily and thiamine 100 mg daily.  Folic acid and thiamine are to help brain function in light of the recent stroke and excessive alcohol use  I am glad that you quit smoking, and I recommend you significantly limit your alcohol intake  We will refer you to an eye doctor for consult  In the coming weeks if you have a repeat of your initial symptoms such as one-sided weakness numbness or tingling or sudden loss of vision or hearing or slurred speech, then call 911  I recommend exercising most days of the week with walking  I recommend a healthy diet.    Do's:   whole grains such as whole grain pasta, rice, whole grains breads and whole grain cereals.  Use small quantities such as 1/2 cup per serving or 2 slices of bread per serving.    Eat 3-5 fruits daily  Eat beans at least once daily  Eat almonds in small quantities at least 3 days per week    If they eat meat, I recommend small portions of lean meats such as chicken, fish, and [place].  Eat as much NON corn and NON potato vegetables as they like, particularly raw or steamed  Drink several large glasses of water daily  Cautions:  Limit red meat  Limit corn and potatoes  Limit sweets, cake, pie, candy  Limit beer and alcohol  Avoid fried food, fast food, large portions  Avoid sugary drinks such as regular soda and sweet  tea  Otherwise follow-up with me in a month fasting

## 2018-03-23 ENCOUNTER — Ambulatory Visit (INDEPENDENT_AMBULATORY_CARE_PROVIDER_SITE_OTHER): Payer: Commercial Managed Care - PPO | Admitting: *Deleted

## 2018-03-23 DIAGNOSIS — I639 Cerebral infarction, unspecified: Secondary | ICD-10-CM

## 2018-03-23 LAB — CUP PACEART INCLINIC DEVICE CHECK
Date Time Interrogation Session: 20191113123046
MDC IDC PG IMPLANT DT: 20191029

## 2018-03-23 NOTE — Progress Notes (Signed)
Wound check appointment. Steri-strips removed prior to appointment. Wound without redness or edema. Incision edges approximated, wound well healed. Battery status: GOOD. R-waves 0.2320mV. 0 symptom episodes, 0 tachy episodes, 0 pause episodes, 0 brady episodes. 0 AF episodes (0% burden). Monthly summary reports and ROV with GT PRN.

## 2018-03-24 ENCOUNTER — Encounter: Payer: Self-pay | Admitting: Registered Nurse

## 2018-03-24 ENCOUNTER — Encounter: Payer: Commercial Managed Care - PPO | Attending: Registered Nurse | Admitting: Registered Nurse

## 2018-03-24 VITALS — BP 122/78 | HR 80 | Ht 66.5 in | Wt 178.0 lb

## 2018-03-24 DIAGNOSIS — I129 Hypertensive chronic kidney disease with stage 1 through stage 4 chronic kidney disease, or unspecified chronic kidney disease: Secondary | ICD-10-CM | POA: Insufficient documentation

## 2018-03-24 DIAGNOSIS — I10 Essential (primary) hypertension: Secondary | ICD-10-CM | POA: Diagnosis not present

## 2018-03-24 DIAGNOSIS — Z87891 Personal history of nicotine dependence: Secondary | ICD-10-CM | POA: Diagnosis not present

## 2018-03-24 DIAGNOSIS — E785 Hyperlipidemia, unspecified: Secondary | ICD-10-CM | POA: Diagnosis not present

## 2018-03-24 DIAGNOSIS — Z8673 Personal history of transient ischemic attack (TIA), and cerebral infarction without residual deficits: Secondary | ICD-10-CM | POA: Insufficient documentation

## 2018-03-24 DIAGNOSIS — R531 Weakness: Secondary | ICD-10-CM | POA: Diagnosis not present

## 2018-03-24 DIAGNOSIS — N182 Chronic kidney disease, stage 2 (mild): Secondary | ICD-10-CM | POA: Insufficient documentation

## 2018-03-24 DIAGNOSIS — I639 Cerebral infarction, unspecified: Secondary | ICD-10-CM

## 2018-03-24 DIAGNOSIS — Z9889 Other specified postprocedural states: Secondary | ICD-10-CM | POA: Diagnosis not present

## 2018-03-24 DIAGNOSIS — Z8249 Family history of ischemic heart disease and other diseases of the circulatory system: Secondary | ICD-10-CM | POA: Insufficient documentation

## 2018-03-24 NOTE — Progress Notes (Signed)
Subjective:    Patient ID: Jesse Price Mathe, male    DOB: 01/07/1966, 52 y.o.   MRN: [id number]  HPI: Jesse Price is a 52 year old male who is here for transitional care visit in follow up of his acute ischemic stroke, left side weakness, HTN, dyslipidemia and CKD Stage 2. He has been [facility]. He denies pain and reports good appetite.   Pain Inventory Average Pain 0 Pain Right Now 0 My pain is na  In the last 24 hours, has pain interfered with the following? General activity 0 Relation with others 0 Enjoyment of life 0 What TIME of day is your pain at its worst? na Sleep (in general) Good  Pain is worse with: na Pain improves with: na Relief from Meds: na  Mobility walk without assistance ability to climb steps?  yes do you drive?  no  Function employed # of hrs/week 40  Neuro/Psych dizziness  Prior Studies Any changes since last visit?  no  Physicians involved in your care Any changes since last visit?  no   Family History  Problem Relation Age of Onset  . Cancer Mother        brain  . Other Father        died in fire  . Hypertension Sister   . Hypertension Brother   . Cancer Brother        back  . Hypertension Sister   . Hypertension Sister   . Heart disease Neg Hx   . Stroke Neg Hx    Social History   Socioeconomic History  . Marital status: Divorced    Spouse name: Not on file  . Number of children: Not on file  . Years of education: Not on file  . Highest education level: Not on file  Occupational History  . Not on file  Social Needs  . Financial resource strain: Not on file  . Food insecurity:    Worry: Not on file    Inability: Not on file  . Transportation needs:    Medical: Not on file    Non-medical: Not on file  Tobacco Use  . Smoking status: Former Smoker    Packs/day: 0.50    Types: Cigarettes  . Smokeless tobacco: Never Used  Substance and Sexual Activity  . Alcohol use: Yes      Alcohol/week: 28.0 standard drinks    Types: 28 Cans of beer per week  . Drug use: No  . Sexual activity: Not on file  Lifestyle  . Physical activity:    Days per week: Not on file    Minutes per session: Not on file  . Stress: Not on file  Relationships  . Social connections:    Talks on phone: Not on file    Gets together: Not on file    Attends religious service: Not on file    Active member of club or organization: Not on file    Attends meetings of clubs or organizations: Not on file    Relationship status: Not on file  Other Topics Concern  . Not on file  Social History Narrative   Reside with sister in [place], [place] on [address]   Employment- welding/read blueprint   One daughter 53 y/o resides in [place]., second yr in college   One son, [age] [place]    Past Surgical History:  Procedure Laterality Date  . LOOP RECORDER INSERTION N/A 03/08/2018   Procedure: LOOP RECORDER  INSERTION;  Surgeon: Marinus Maw, MD;  Location: [facility] INVASIVE CV LAB;  Service: Cardiovascular;  Laterality: N/A;  . TEE WITHOUT CARDIOVERSION N/A 03/08/2018   Procedure: TRANSESOPHAGEAL ECHOCARDIOGRAM (TEE);  Surgeon: Jake Bathe, MD;  Location: [facility] ENDOSCOPY;  Service: Cardiovascular;  Laterality: N/A;   Past Medical History:  Diagnosis Date  . Asthma   . Hypertension    Ht 5' 6.5" (1.689 m)   Wt 178 lb (80.7 kg)   BMI 28.30 kg/m   Opioid Risk Score:   Fall Risk Score:  `1  Depression screen PHQ 2/9  Depression screen PHQ 2/9 03/10/2017  Decreased Interest 0  Down, Depressed, Hopeless 0  PHQ - 2 Score 0     Review of Systems  Constitutional: Negative.   HENT: Negative.   Eyes: Negative.   Respiratory: Negative.   Cardiovascular: Negative.   Gastrointestinal: Negative.   Endocrine: Negative.   Genitourinary: Negative.   Musculoskeletal: Negative.   Skin: Negative.   Allergic/Immunologic: Negative.   Neurological: Positive for dizziness.  Hematological: Negative.    Psychiatric/Behavioral: Negative.   All other systems reviewed and are negative.      Objective:   Physical Exam  Constitutional: He is oriented to person, place, and time. He appears well-developed and well-nourished.  HENT:  Head: Normocephalic and atraumatic.  Neck: Normal range of motion.  Cardiovascular: Normal rate.  Pulmonary/Chest: Effort normal and breath sounds normal.  Musculoskeletal:  Normal Muscle Bulk and Muscle Testing reveals: Upper Extremities: Full ROM and Muscle Strength 5/5 Lower Extremities: Full ROM and Muscle Strength 5/5 Arises from table with ease Narrow based gait   Neurological: He is alert and oriented to person, place, and time.  Skin: Skin is warm and dry.  Psychiatric: He has a normal mood and affect. His behavior is normal.  Nursing note and vitals reviewed.         Assessment & Plan:  1. Acute Ischemic Stroke: Continue with Therapies at [facility] and has a scheduled appointment with Neurology he reports.  2. Left Side Weakness: Continue Therapies. Continue to Monitor.  3. HTN: Controlled: PCP Following Continue Norvasc.  4. Dyslipidemia: Continue current medication regimen. PCP Following 5. CKD Stage 2: PCP Following,   20 minutes of face to face patient care time was spent during this visit. All questions were encouraged and answered.  F/U in 4-6 weeks with Dr. Wynn Banker

## 2018-03-25 ENCOUNTER — Inpatient Hospital Stay: Payer: PRIVATE HEALTH INSURANCE | Admitting: Medical

## 2018-03-29 ENCOUNTER — Ambulatory Visit: Payer: Commercial Managed Care - PPO | Admitting: Occupational Therapy

## 2018-03-31 ENCOUNTER — Ambulatory Visit: Payer: Commercial Managed Care - PPO | Admitting: Occupational Therapy

## 2018-03-31 ENCOUNTER — Encounter: Payer: Self-pay | Admitting: Occupational Therapy

## 2018-03-31 DIAGNOSIS — R2689 Other abnormalities of gait and mobility: Secondary | ICD-10-CM | POA: Diagnosis not present

## 2018-03-31 DIAGNOSIS — R278 Other lack of coordination: Secondary | ICD-10-CM | POA: Diagnosis not present

## 2018-03-31 DIAGNOSIS — R482 Apraxia: Secondary | ICD-10-CM

## 2018-03-31 DIAGNOSIS — I69818 Other symptoms and signs involving cognitive functions following other cerebrovascular disease: Secondary | ICD-10-CM

## 2018-03-31 DIAGNOSIS — R41842 Visuospatial deficit: Secondary | ICD-10-CM

## 2018-03-31 NOTE — Therapy (Signed)
[facility] [address] [place], [place], [address] Phone: [phone]   Fax:  [phone]  Occupational Therapy Treatment  Patient Details  Name: Jesse Price MRN: [id number] Date of Birth: 23-Mar-1966 Referring Provider (OT): Dr. Claudette Laws   Encounter Date: 03/31/2018  OT End of Session - 03/31/18 1230    Visit Number  2    Number of Visits  8    Date for OT Re-Evaluation  04/18/18    Authorization Type  [employer]    Authorization Time Period  20 visit limit pt has used 0/20 before today    OT Start Time  0801    OT Stop Time  0843    OT Time Calculation (min)  42 min    Activity Tolerance  Patient tolerated treatment well       Past Medical History:  Diagnosis Date  . Asthma   . Hypertension     Past Surgical History:  Procedure Laterality Date  . LOOP RECORDER INSERTION N/A 03/08/2018   Procedure: LOOP RECORDER INSERTION;  Surgeon: Marinus Maw, MD;  Location: [facility] INVASIVE CV LAB;  Service: Cardiovascular;  Laterality: N/A;  . TEE WITHOUT CARDIOVERSION N/A 03/08/2018   Procedure: TRANSESOPHAGEAL ECHOCARDIOGRAM (TEE);  Surgeon: Jake Bathe, MD;  Location: [facility] ENDOSCOPY;  Service: Cardiovascular;  Laterality: N/A;    There were no vitals filed for this visit.  Subjective Assessment - 03/31/18 0802    Subjective   Things are going pretty good I think.     Pertinent History  PMH:  Pt with R frontal CVA on 03/05/2018. Inpt rehab d/c home on 03/15/2018  multiple chronic infarcts bilaterally, tobacco, +ETOH use, HTN, HLD, chronic kidney disease Stage II    Patient Stated Goals  to get my hand working properly - I am left handed.     Currently in Pain?  No/denies                   OT Treatments/Exercises (OP) - 03/31/18 0001      ADLs   ADL Comments  reveiwed OT goals and POC and pt verbalized understanding and agreement.  Pt declined written copy of goals.        Neurological Re-education Exercises   Other Exercises 1  After demonstration, instruction and practice pt able to return all activities for HEP for coordination. See pt education section for details.  Pt also given horizontal head turns for habituation as head turns continue to make pt symptomatic of disequilibirum.  Pt's saccades and gaze stabilization have improved since last week.  Pt more symptomatic with horizontal head turns with eyes closed.  Pt able to return demonstrate after practice and repetition.               OT Education - 03/31/18 1228    Education Details  HEP for coordination and vestibular habituation for horizontal head turns in sitting.     Person(s) Educated  Patient    Methods  Explanation;Demonstration;Verbal cues;Handout    Comprehension  Verbalized understanding;Returned demonstration          OT Long Term Goals - 03/31/18 1229      OT LONG TERM GOAL #1   Title  Pt will be mod I with HEP for coordination, visual vestibular exercises - 04/18/2018    Status  On-going      OT LONG TERM GOAL #2   Title  Pt will demonstrate improved coordination as evidenced by decreasing  time on 9hole peg test for LUE by at least 8 seconds (baseline=45.63) to assist with ADL and IADL tasks    Status  On-going      OT LONG TERM GOAL #3   Title  Pt will report no greater than 3/10 intensity for visual vestibular symptoms with horizontal head turns during home mgmt tasks.     Status  On-going      OT LONG TERM GOAL #4   Title  Pt will report greater ease in cutting food, writing and tying shoes    Status  On-going      OT LONG TERM GOAL #5   Title  Pt will demonstrate environmental scanning during functional task with 90% accuracy for home mgmt and shopping tasks.     Status  On-going            Plan - 03/31/18 1229    Clinical Impression Statement  Pt in agreement with OT POC and goals. Pt progressing toward goals.     Occupational Profile and client history  currently impacting functional performance  PMH:  HTN, HLD, chronic kidney disease Stage II, tobacoo and alcohol use.  Multiple chronic brain infarcts, bilaterally.    Occupational performance deficits (Please refer to evaluation for details):  ADL's;IADL's;Work    Rehab Potential  Good    OT Frequency  2x / week    OT Duration  4 weeks    OT Treatment/Interventions  Self-care/ADL training;Therapeutic exercise;Neuromuscular education;DME and/or AE instruction;Therapeutic activities;Visual/perceptual remediation/compensation;Patient/family education    Plan  check HEP for coordination as well as visual vestibular issues; address L coordination, writing, scanning    Consulted and Agree with Plan of Care  Patient       Patient will benefit from skilled therapeutic intervention in order to improve the following deficits and impairments:  Decreased cognition, Decreased coordination, Decreased safety awareness, Impaired UE functional use, Impaired vision/preception  Visit Diagnosis: Other lack of coordination  Visuospatial deficit  Apraxia  Other symptoms and signs involving cognitive functions following other cerebrovascular disease    Problem List Patient Active Problem List   Diagnosis Date Noted  . Former smoker 03/22/2018  . Gait disturbance, post-stroke   . Embolic cerebral infarction (HCC) 03/09/2018  . Dyslipidemia   . Cerebrovascular accident (CVA) (HCC)   . Left-sided weakness   . Benign essential HTN   . Tobacco abuse   . Diastolic dysfunction   . Leukocytosis   . Acute ischemic stroke (HCC) 03/05/2018  . CKD (chronic kidney disease), stage II 03/05/2018  . Alcohol dependence (HCC) 03/05/2018  . Essential hypertension 03/17/2017  . Abnormal kidney function 03/17/2017  . Hyperkalemia 03/17/2017  . Sleep disturbance 03/17/2017  . Sleep walking 03/17/2017  . Sebaceous cyst 03/17/2017  . Laceration of nose 03/17/2017    Norton Pastelulaski, Todrick Siedschlag Halliday, OTR/L 03/31/2018,  12:32 PM  [facility] [address] [place], [place], [address] Phone: [phone]   Fax:  [phone]  Name: Thurnell LoseMichael Gerlich MRN: [id number] Date of Birth: [birth year]

## 2018-03-31 NOTE — Patient Instructions (Signed)
  Coordination Activities  Perform the following activities for 15-20 minutes 1-2 times per day with left hand(s).   Rotate ball in fingertips (clockwise and counter-clockwise).  Toss ball between hands.  Toss ball in air and catch with the same hand.  Flip cards 1 at a time as fast as you can.  Use a timer on your cell phone and try do beat your time each time you do it. Try not to slide the cards to the edge of the table to pick up.   Pick up coins and place in container or coin bank one at time.  Go as fast as you can. Try not to slide the coins to the edge of table to pick them up.   Pick up coins and stack 4 pennies.  When this becomes easy, try to stack 5-6    Pick up coins one at a time until you get 5 in your hand, then move coins from palm to fingertips to stack one at a time.  Screw together nuts and bolts, then unfasten. Use smaller ones to make sure you work your hand enough.

## 2018-04-04 ENCOUNTER — Ambulatory Visit: Payer: Commercial Managed Care - PPO | Admitting: Speech Pathology

## 2018-04-04 DIAGNOSIS — R41841 Cognitive communication deficit: Secondary | ICD-10-CM

## 2018-04-04 DIAGNOSIS — R278 Other lack of coordination: Secondary | ICD-10-CM | POA: Diagnosis not present

## 2018-04-04 DIAGNOSIS — R2689 Other abnormalities of gait and mobility: Secondary | ICD-10-CM | POA: Diagnosis not present

## 2018-04-04 NOTE — Patient Instructions (Signed)
   Cognitive Activities you can do at home:   - Solitaire  - Majong  - Scrabble  - Chess/Checkers  - Crosswords (easy level)  - [employer]no  - Card Games  - Board Games  - Connect 4  - Simon  - the Memory Game  - Dominoes  - Backgammon  - Jig Saw

## 2018-04-11 ENCOUNTER — Ambulatory Visit (INDEPENDENT_AMBULATORY_CARE_PROVIDER_SITE_OTHER): Payer: Commercial Managed Care - PPO

## 2018-04-11 ENCOUNTER — Ambulatory Visit: Payer: Commercial Managed Care - PPO | Admitting: Speech Pathology

## 2018-04-11 ENCOUNTER — Ambulatory Visit: Payer: Commercial Managed Care - PPO | Attending: Medical | Admitting: Occupational Therapy

## 2018-04-11 DIAGNOSIS — I639 Cerebral infarction, unspecified: Secondary | ICD-10-CM | POA: Diagnosis not present

## 2018-04-11 DIAGNOSIS — R41841 Cognitive communication deficit: Secondary | ICD-10-CM | POA: Insufficient documentation

## 2018-04-11 DIAGNOSIS — R482 Apraxia: Secondary | ICD-10-CM | POA: Insufficient documentation

## 2018-04-11 DIAGNOSIS — R278 Other lack of coordination: Secondary | ICD-10-CM | POA: Insufficient documentation

## 2018-04-11 DIAGNOSIS — I69818 Other symptoms and signs involving cognitive functions following other cerebrovascular disease: Secondary | ICD-10-CM | POA: Insufficient documentation

## 2018-04-11 DIAGNOSIS — R41842 Visuospatial deficit: Secondary | ICD-10-CM | POA: Insufficient documentation

## 2018-04-11 NOTE — Progress Notes (Signed)
[facility] Summary Report / Loop Recorder 

## 2018-04-11 NOTE — Therapy (Signed)
[facility] [address] [place], [place], [address] Phone: [phone]   Fax:  [phone]  Speech Language Pathology Evaluation  Patient Details  Name: Jesse Price MRN: [id number] Date of Birth: Oct 14, 1965 Referring Provider (SLP): Dr. Claudette Laws   Encounter Date: 04/04/2018  End of Session - 04/11/18 0819    Visit Number  1    Number of Visits  13    Date for SLP Re-Evaluation  05/16/18    SLP Start Time  0803    SLP Stop Time   0844    SLP Time Calculation (min)  41 min    Activity Tolerance  Patient tolerated treatment well       Past Medical History:  Diagnosis Date  . Asthma   . Hypertension     Past Surgical History:  Procedure Laterality Date  . LOOP RECORDER INSERTION N/A 03/08/2018   Procedure: LOOP RECORDER INSERTION;  Surgeon: Marinus Maw, MD;  Location: [facility] INVASIVE CV LAB;  Service: Cardiovascular;  Laterality: N/A;  . TEE WITHOUT CARDIOVERSION N/A 03/08/2018   Procedure: TRANSESOPHAGEAL ECHOCARDIOGRAM (TEE);  Surgeon: Jake Bathe, MD;  Location: [facility] ENDOSCOPY;  Service: Cardiovascular;  Laterality: N/A;    There were no vitals filed for this visit.  Subjective Assessment - 04/11/18 0811    Subjective  "I want to get back to work"    Currently in Pain?  No/denies         SLP Evaluation [facility] - 04/11/18 6962      SLP Visit Information   SLP Received On  04/04/18    Referring Provider (SLP)  Dr. Claudette Laws    Onset Date  03/05/18    Medical Diagnosis  Right frontal CVA      Subjective   Patient/Family Stated Goal  To get back to work      General Information   HPI  Pt is a 52 year old male s/p R frontal CVA on 03/05/2018. Pt was discharged home on 03/15/2018 after stay in inpt rehab. Pt is a 52 year old male s/p R frontal CVA on 03/05/2018. Pt was discharged home on 03/15/2018 after stay in inpt rehab.  He was evaluated by ST on acute care and noted to have  moderate cognitive impairment. Pt reports memory loss. 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 Independent with all high level ADL's prior to CVA. He is getting help with meals and shopping from his sisters.     Mobility Status  walks independently      Balance Screen   Has the patient fallen in the past 6 months  No    Has the patient had a decrease in activity level because of a fear of falling?   No    Is the patient reluctant to leave their home because of a fear of falling?   No      Prior Functional Status   Cognitive/Linguistic Baseline  Within functional limits    Type of Home  House     Lives With  Other (Comment)   sister   Available Support  Family      Cognition   Overall Cognitive Status  Impaired/Different from baseline    Attention  Alternating    Alternating Attention  Impaired    Alternating Attention Impairment  Verbal complex;Functional complex    Memory  Impaired    Memory Impairment  Decreased recall of new information    Awareness  Impaired  Awareness Impairment  Emergent impairment;Intellectual impairment    Executive Function  Self Monitoring;Self Correcting;Decision Making      Verbal Expression   Overall Verbal Expression  Appears within functional limits for tasks assessed      Oral Motor/Sensory Function   Overall Oral Motor/Sensory Function  Appears within functional limits for tasks assessed      Motor Speech   Overall Motor Speech  Appears within functional limits for tasks assessed      Standardized Assessments   Standardized Assessments   Cognitive Linguistic Quick Test      Cognitive Linguistic Quick Test (Ages 18-69)   Attention  Moderate    Memory  Moderate    Executive Function  WNL    Language  Mild    Visuospatial Skills  Mild    Severity Rating Total  14    Composite Severity Rating  11.6                      SLP Education - 04/11/18 1043    Education Details  cognitive activities to do at home    Person(s) Educated   Patient    Methods  Explanation;Handout    Comprehension  Verbalized understanding         SLP Long Term Goals - 04/11/18 1045      SLP LONG TERM GOAL #1   Title  Pt will utilize external aids for appointment and daily schedule management with rare min A over 2 sessions    Time  4    Period  Weeks    Status  New      SLP LONG TERM GOAL #2   Title  Pt will complete daily blood pressure log with mod I over 3 sessions    Time  4    Period  Weeks    Status  New      SLP LONG TERM GOAL #3   Title  Pt will  generate 4 compensatory strategies for attention,  visual impairment and UE weakness for safety at work and driving  and report carryover of these over 3 sessions    Time  4    Period  Weeks    Status  New       Plan - 04/11/18 1044    Clinical Impression Statement  Jesse Price, a 52 y.o. male was hospitalized 03/05/18 to 03/15/18 s/p right frontal CVA. Prior to CVA Jesse Price was independent, working full time as a Psychologist, occupationalwelder. OT noted cognitive impairments, and pt afirmed memory changes since CVA therefore pt referred to ST. Today, Jesse Price presents with mild to moderate cognitive impairments primarilhy in memory and attention. He reports he is managing medications however he is inconsistently remembering to check is BP daily. He reports visual and UE fine motor impairments but demonstrates reduced safety awareness of how this may affect driving and welding. Jesse Price reports that his sister is reminding him of his appointments. I recommend short course of ST to train pt in compensations for memory and attention to maximize safety and independence for possible return to work.     Speech Therapy Frequency  2x / week    Duration  --   13 visits or 6 weeks, may d/c sooner pending progress   Treatment/Interventions  Compensatory strategies;Patient/family education;Functional tasks;Cognitive reorganization;Environmental controls;SLP instruction and feedback;Internal/external  aids;Compensatory techniques;Language facilitation;Cueing hierarchy    Potential to Achieve Goals  Good       Patient will benefit from  skilled therapeutic intervention in order to improve the following deficits and impairments:   Cognitive communication deficit    Problem List Patient Active Problem List   Diagnosis Date Noted  . Former smoker 03/22/2018  . Gait disturbance, post-stroke   . Embolic cerebral infarction (HCC) 03/09/2018  . Dyslipidemia   . Cerebrovascular accident (CVA) (HCC)   . Left-sided weakness   . Benign essential HTN   . Tobacco abuse   . Diastolic dysfunction   . Leukocytosis   . Acute ischemic stroke (HCC) 03/05/2018  . CKD (chronic kidney disease), stage II 03/05/2018  . Alcohol dependence (HCC) 03/05/2018  . Essential hypertension 03/17/2017  . Abnormal kidney function 03/17/2017  . Hyperkalemia 03/17/2017  . Sleep disturbance 03/17/2017  . Sleep walking 03/17/2017  . Sebaceous cyst 03/17/2017  . Laceration of nose 03/17/2017    , Radene Journey MS, CCC-SLP 04/11/2018, 10:50 AM  [facility] [address] [place], [place], [address] Phone: [phone]   Fax:  [phone]  Name: Jesse Price MRN: [id number] Date of Birth: [birth year]

## 2018-04-12 ENCOUNTER — Encounter: Payer: Self-pay | Admitting: Speech Pathology

## 2018-04-12 ENCOUNTER — Ambulatory Visit: Payer: Commercial Managed Care - PPO | Admitting: Occupational Therapy

## 2018-04-12 ENCOUNTER — Ambulatory Visit: Payer: Commercial Managed Care - PPO | Admitting: Speech Pathology

## 2018-04-12 ENCOUNTER — Encounter: Payer: Self-pay | Admitting: Occupational Therapy

## 2018-04-12 ENCOUNTER — Telehealth: Payer: Self-pay | Admitting: Speech Pathology

## 2018-04-12 DIAGNOSIS — R41841 Cognitive communication deficit: Secondary | ICD-10-CM

## 2018-04-12 DIAGNOSIS — R278 Other lack of coordination: Secondary | ICD-10-CM

## 2018-04-12 DIAGNOSIS — R482 Apraxia: Secondary | ICD-10-CM | POA: Diagnosis present

## 2018-04-12 DIAGNOSIS — I69818 Other symptoms and signs involving cognitive functions following other cerebrovascular disease: Secondary | ICD-10-CM

## 2018-04-12 DIAGNOSIS — R41842 Visuospatial deficit: Secondary | ICD-10-CM | POA: Diagnosis present

## 2018-04-12 NOTE — Therapy (Signed)
[facility] [address] [place], [place], [address] Phone: [phone]   Fax:  [phone]  Speech Language Pathology Treatment  Patient Details  Name: Jesse Price MRN: [id number] Date of Birth: 07-14-1965 Referring Provider (SLP): Dr. Claudette Laws   Encounter Date: 04/12/2018  End of Session - 04/12/18 1115    Visit Number  2    Number of Visits  13    Date for SLP Re-Evaluation  05/16/18    SLP Start Time  0933    SLP Stop Time   1014    SLP Time Calculation (min)  41 min    Activity Tolerance  Patient tolerated treatment well       Past Medical History:  Diagnosis Date  . Asthma   . Hypertension     Past Surgical History:  Procedure Laterality Date  . LOOP RECORDER INSERTION N/A 03/08/2018   Procedure: LOOP RECORDER INSERTION;  Surgeon: Marinus Maw, MD;  Location: [facility] INVASIVE CV LAB;  Service: Cardiovascular;  Laterality: N/A;  . TEE WITHOUT CARDIOVERSION N/A 03/08/2018   Procedure: TRANSESOPHAGEAL ECHOCARDIOGRAM (TEE);  Surgeon: Jake Bathe, MD;  [facility] ENDOSCOPY;  Service: Cardiovascular;  Laterality: N/A;    There were no vitals filed for this visit.  Subjective Assessment - 04/12/18 0934    Subjective  "they told me to be here at 8:00, but my schedule just said 8:45"    Currently in Pain?  No/denies            ADULT SLP TREATMENT - 04/12/18 0935      General Information   Behavior/Cognition  Alert;Cooperative;Pleasant mood      Treatment Provided   Treatment provided  Cognitive-Linquistic      Cognitive-Linquistic Treatment   Treatment focused on  Cognition    Skilled Treatment  Pt has missed appointments and been confused with schedule. Trained pt in compensations for memory - pt instructed to take a picture of his calendar at home crossing off days. Generated chart for daily traking of his blood pressure as he has been inconsistently traking "when I think about it  or eat something" Pt trained in compensaions for memory and schedule management. Provided written remider and pharmacy phone number as pt reports he is low on his blood pressure pills.       Assessment / Recommendations / Plan   Plan  Continue with current plan of care      Progression Toward Goals   Progression toward goals  Progressing toward goals       SLP Education - 04/12/18 1111    Education Details  compensations for appointment management and management of blood pressure; reduce sodium in diet    Person(s) Educated  Patient    Methods  Explanation;Demonstration;Handout    Comprehension  Verbalized understanding;Verbal cues required         SLP Long Term Goals - 04/12/18 1115      SLP LONG TERM GOAL #1   Title  Pt will utilize external aids for appointment and daily schedule management with rare min A over 2 sessions    Time  4    Period  Weeks    Status  On-going      SLP LONG TERM GOAL #2   Title  Pt will complete daily blood pressure log with mod I over 3 sessions    Time  4    Period  Weeks    Status  On-going  SLP LONG TERM GOAL #3   Title  Pt will  generate 4 compensatory strategies for attention,  visual impairment and UE weakness for safety at work and driving  and report carryover of these over 3 sessions    Time  4    Period  Weeks    Status  On-going       Plan - 04/12/18 1113    Clinical Impression Statement  Mr. Doreene ElandHendrix has missed several appointments due to confusion with calendar. Generated compenstions for schedule/calendar management. Generated compensations to remember to check his blood pressure regularly. Continue skilled ST to maximize carrover of compensations for reduced memory and attention for safety and possible return to work.      Speech Therapy Frequency  2x / week    Duration  --   13 visits, or 6 weeks   Treatment/Interventions  Compensatory strategies;Patient/family education;Functional tasks;Cognitive  reorganization;Environmental controls;SLP instruction and feedback;Internal/external aids;Compensatory techniques;Language facilitation;Cueing hierarchy    Potential to Achieve Goals  Good       Patient will benefit from skilled therapeutic intervention in order to improve the following deficits and impairments:   Cognitive communication deficit    Problem List Patient Active Problem List   Diagnosis Date Noted  . Former smoker 03/22/2018  . Gait disturbance, post-stroke   . Embolic cerebral infarction (HCC) 03/09/2018  . Dyslipidemia   . Cerebrovascular accident (CVA) (HCC)   . Left-sided weakness   . Benign essential HTN   . Tobacco abuse   . Diastolic dysfunction   . Leukocytosis   . Acute ischemic stroke (HCC) 03/05/2018  . CKD (chronic kidney disease), stage II 03/05/2018  . Alcohol dependence (HCC) 03/05/2018  . Essential hypertension 03/17/2017  . Abnormal kidney function 03/17/2017  . Hyperkalemia 03/17/2017  . Sleep disturbance 03/17/2017  . Sleep walking 03/17/2017  . Sebaceous cyst 03/17/2017  . Laceration of nose 03/17/2017    Dhanvi Boesen, Radene JourneyLaura Ann MS, CCC-SLP 04/12/2018, 11:16 AM  [facility] [address] [place], [place], [address] Phone: [phone]   Fax:  [phone]   Name: Thurnell LoseMichael Allinson MRN: [id number] Date of Birth: [birth year]

## 2018-04-12 NOTE — Patient Instructions (Signed)
   Get a folder for all of your PT, OT, and ST homework and exercises and bring it back  Keep a log of blood pressure daily and bring it back  Consider a pill organizer to keep your pills organized for each week  Play the memory game on your phone or tablet or get a memory game  Go to [employer] and get your pills   Good job reducing chips, beer and no smoking

## 2018-04-12 NOTE — Telephone Encounter (Signed)
Pt was a no-show for appointments 04/11/18 and 04/12/18 for speech therapy. Left message and requested pt call back to confirm if he plans to return for therapy.   Rondel BatonMary Beth Seng Fouts, MS, Sports administratorCCC-SLP Speech-Language Pathologist

## 2018-04-12 NOTE — Therapy (Signed)
[facility] [address] [place], [place], [address] Phone: [phone]   Fax:  [phone]  Occupational Therapy Treatment  Patient Details  Name: Jesse Price MRN: [id number] Date of Birth: 10/17/1965 Referring Provider (OT): Dr. Claudette Laws   Encounter Date: 04/12/2018  OT End of Session - 04/12/18 1202    Visit Number  3    Number of Visits  8    Date for OT Re-Evaluation  04/18/18    Authorization Type  [employer]    Authorization Time Period  20 visit limit pt has used 0/20 before today    OT Start Time  0846    OT Stop Time  0929    OT Time Calculation (min)  43 min    Activity Tolerance  Patient tolerated treatment well       Past Medical History:  Diagnosis Date  . Asthma   . Hypertension     Past Surgical History:  Procedure Laterality Date  . LOOP RECORDER INSERTION N/A 03/08/2018   Procedure: LOOP RECORDER INSERTION;  Surgeon: Marinus Maw, MD;  Location: [facility] INVASIVE CV LAB;  Service: Cardiovascular;  Laterality: N/A;  . TEE WITHOUT CARDIOVERSION N/A 03/08/2018   Procedure: TRANSESOPHAGEAL ECHOCARDIOGRAM (TEE);  Surgeon: Jake Bathe, MD;  Location: [facility] ENDOSCOPY;  Service: Cardiovascular;  Laterality: N/A;    There were no vitals filed for this visit.  Subjective Assessment - 04/12/18 0852    Subjective   I know I missed my OT yesterday - I got all turned around with my schedule    Pertinent History  PMH:  Pt with R frontal CVA on 03/05/2018. Inpt rehab d/c home on 03/15/2018  multiple chronic infarcts bilaterally, tobacco, +ETOH use, HTN, HLD, chronic kidney disease Stage II    Patient Stated Goals  to get my hand working properly - I am left handed.     Currently in Pain?  No/denies                   OT Treatments/Exercises (OP) - 04/12/18 0001      ADLs   Writing  Addressed hand writing - tried various small adaptations to increase ease in writing - pt  benefitted most from small commercial pencil grip (issued to pt). Pt able to write name 3 times legibly as well as full sentence with 100% eligibility. Pt with improved speed in writing using small grip.  Pt reports that he used to draw prior to the stroke but has not tried any since.  Had pt draw simiple picture (he drew a reindeer) - encouraged pt to use this activity at home to address functional use of his left hand, coordination and building up activity tolerance to R hand      Exercises   Exercises  Hand      Hand Exercises   Other Hand Exercises  Pt reports he has been compliant with HEP for coordination.  Addressed in hand manipulation and coordination with LUE using Grooved peg board - pt with min difficulty and required increasd time.  Also completed Randell Patient Dexterity as activity without using tweezers. Pt with minimal difficulty and increased time.  Pt reported fatigue of 6/10 at end of both activities.        Visual/Perceptual Exercises   Other Exercises  Pt reports that "dizziness symptoms" with HEP for horizontal head turns is decreased in intensity however pt continues to experience periods of dizziness.  Explained to pt that  if intensity       Neurological Re-education Exercises   Other Exercises 1  Neuro re ed to address dynamic standing balance/functional ambulation with horizontal head turns. Pt reports intensity of 4/10 with duration of less than 15 seconds.                    OT Long Term Goals - 04/12/18 1200      OT LONG TERM GOAL #1   Title  Pt will be mod I with HEP for coordination, visual vestibular exercises - 04/18/2018    Status  On-going      OT LONG TERM GOAL #2   Title  Pt will demonstrate improved coordination as evidenced by decreasing time on 9hole peg test for LUE by at least 8 seconds (baseline=45.63) to assist with ADL and IADL tasks    Status  On-going      OT LONG TERM GOAL #3   Title  Pt will report no greater than 3/10 intensity  for visual vestibular symptoms with horizontal head turns during home mgmt tasks.     Status  On-going      OT LONG TERM GOAL #4   Title  Pt will report greater ease in cutting food, writing and tying shoes    Status  Achieved      OT LONG TERM GOAL #5   Title  Pt will demonstrate environmental scanning during functional task with 90% accuracy for home mgmt and shopping tasks.     Status  On-going            Plan - 04/12/18 1201    Clinical Impression Statement  Pt with slow progress toward goals. Pt missed session yesterday due to reading schedule wrong.     Occupational Profile and client history currently impacting functional performance  PMH:  HTN, HLD, chronic kidney disease Stage II, tobacoo and alcohol use.  Multiple chronic brain infarcts, bilaterally.    Occupational performance deficits (Please refer to evaluation for details):  ADL's;IADL's;Work    Rehab Potential  Good    OT Frequency  2x / week    OT Duration  4 weeks    OT Treatment/Interventions  Self-care/ADL training;Therapeutic exercise;Neuromuscular education;DME and/or AE instruction;Therapeutic activities;Visual/perceptual remediation/compensation;Patient/family education    Plan  check 9 hole peg test, address L coordination, writing, scanning    Consulted and Agree with Plan of Care  Patient       Patient will benefit from skilled therapeutic intervention in order to improve the following deficits and impairments:  Decreased cognition, Decreased coordination, Decreased safety awareness, Impaired UE functional use, Impaired vision/preception  Visit Diagnosis: Other lack of coordination  Visuospatial deficit  Apraxia  Other symptoms and signs involving cognitive functions following other cerebrovascular disease    Problem List Patient Active Problem List   Diagnosis Date Noted  . Former smoker 03/22/2018  . Gait disturbance, post-stroke   . Embolic cerebral infarction (HCC) 03/09/2018  .  Dyslipidemia   . Cerebrovascular accident (CVA) (HCC)   . Left-sided weakness   . Benign essential HTN   . Tobacco abuse   . Diastolic dysfunction   . Leukocytosis   . Acute ischemic stroke (HCC) 03/05/2018  . CKD (chronic kidney disease), stage II 03/05/2018  . Alcohol dependence (HCC) 03/05/2018  . Essential hypertension 03/17/2017  . Abnormal kidney function 03/17/2017  . Hyperkalemia 03/17/2017  . Sleep disturbance 03/17/2017  . Sleep walking 03/17/2017  . Sebaceous cyst 03/17/2017  . Laceration of nose  03/17/2017    Norton PastelPulaski, Karen Halliday, OTR/L 04/12/2018, 12:03 PM  [facility] [address] [place], [place], [address] Phone: [phone]   Fax:  [phone]  Name: Thurnell LoseMichael Price MRN: [id number] Date of Birth: 07/25/1965

## 2018-04-19 ENCOUNTER — Encounter: Payer: Self-pay | Admitting: Occupational Therapy

## 2018-04-19 ENCOUNTER — Ambulatory Visit: Payer: Commercial Managed Care - PPO | Admitting: Speech Pathology

## 2018-04-19 ENCOUNTER — Ambulatory Visit: Payer: Commercial Managed Care - PPO | Admitting: Occupational Therapy

## 2018-04-19 DIAGNOSIS — R41841 Cognitive communication deficit: Secondary | ICD-10-CM

## 2018-04-19 DIAGNOSIS — R41842 Visuospatial deficit: Secondary | ICD-10-CM

## 2018-04-19 DIAGNOSIS — R278 Other lack of coordination: Secondary | ICD-10-CM | POA: Diagnosis not present

## 2018-04-19 DIAGNOSIS — R482 Apraxia: Secondary | ICD-10-CM

## 2018-04-19 DIAGNOSIS — I69818 Other symptoms and signs involving cognitive functions following other cerebrovascular disease: Secondary | ICD-10-CM

## 2018-04-19 NOTE — Therapy (Signed)
[facility] [address] [place], [place], [address] Phone: [phone]   Fax:  [phone]  Occupational Therapy Treatment  Patient Details  Name: Jesse Price MRN: [id number] Date of Birth: Mar 12, 1966 Referring Provider (OT): Dr. Claudette Laws   Encounter Date: 04/19/2018  OT End of Session - 04/19/18 0926    Visit Number  4    Number of Visits  8    Date for OT Re-Evaluation  05/17/18    Authorization Type  [employer]    Authorization Time Period  20 visit limit pt has used 0/20 before today    OT Start Time  0846    OT Stop Time  0930    OT Time Calculation (min)  44 min    Activity Tolerance  Patient tolerated treatment well       Past Medical History:  Diagnosis Date  . Asthma   . Hypertension     Past Surgical History:  Procedure Laterality Date  . LOOP RECORDER INSERTION N/A 03/08/2018   Procedure: LOOP RECORDER INSERTION;  Surgeon: Marinus Maw, MD;  Location: [facility] INVASIVE CV LAB;  Service: Cardiovascular;  Laterality: N/A;  . TEE WITHOUT CARDIOVERSION N/A 03/08/2018   Procedure: TRANSESOPHAGEAL ECHOCARDIOGRAM (TEE);  Surgeon: Jake Bathe, MD;  Location: [facility] ENDOSCOPY;  Service: Cardiovascular;  Laterality: N/A;    There were no vitals filed for this visit.  Subjective Assessment - 04/19/18 0850    Subjective   The dizziness is getting better    Pertinent History  PMH:  Pt with R frontal CVA on 03/05/2018. Inpt rehab d/c home on 03/15/2018  multiple chronic infarcts bilaterally, tobacco, +ETOH use, HTN, HLD, chronic kidney disease Stage II    Patient Stated Goals  to get my hand working properly - I am left handed.     Currently in Pain?  No/denies                   OT Treatments/Exercises (OP) - 04/19/18 0001      ADLs   ADL Comments  Reassessed goals - see goals section for updates. Pt has improved in fine motor control, as well as improved L visual field.  Pt  now presents only with mild L inferior field deficit.       Visual/Perceptual Exercises   Other Exercises  Addressed functional environmental scanning - pt with 90% accuracy in busy environment      Neurological Re-education Exercises   Other Exercises 1  Pt with decreased duration of vestibular symptoms (less than 7 seconds) with intensity of 4/10 with rapid horizontal head turns in standing.  Pt does not become symptomatic with functional head turns with ambulation.  Progressed pt's HEP to include rapid head turns in standing as well as inclusion of Ball Progression Circle #2. Also addressed in hand manipulation with L hand with functional task.              OT Education - 04/19/18 0920    Education Details  Progressed HEP for vestibular - see PN    Person(s) Educated  Patient    Methods  Explanation;Demonstration;Handout    Comprehension  Verbalized understanding;Returned demonstration          OT Long Term Goals - 04/19/18 0922      OT LONG TERM GOAL #1   Title  Pt will be mod I with HEP for coordination, visual vestibular exercises - 05/17/2018 (pt renewed as he had only attended 4  sessions as of 04/19/2018    Status  On-going      OT LONG TERM GOAL #2   Title  Pt will demonstrate improved coordination as evidenced by decreasing time on 9hole peg test for LUE by at least 8 seconds (baseline=45.63) to assist with ADL and IADL tasks    Status  Achieved   12/10 27.64     OT LONG TERM GOAL #3   Title  Pt will report no greater than 3/10 intensity for visual vestibular symptoms with horizontal head turns during home mgmt tasks.     Status  On-going      OT LONG TERM GOAL #4   Title  Pt will report greater ease in cutting food, writing and tying shoes    Status  Achieved      OT LONG TERM GOAL #5   Title  Pt will demonstrate environmental scanning during functional task with 90% accuracy for home mgmt and shopping tasks.     Status  Achieved            Plan -  04/19/18 0926    Clinical Impression Statement  Pt progressing toward goals. Pt with improving coordination and balance.     Occupational Profile and client history currently impacting functional performance  PMH:  HTN, HLD, chronic kidney disease Stage II, tobacoo and alcohol use.  Multiple chronic brain infarcts, bilaterally.    Occupational performance deficits (Please refer to evaluation for details):  ADL's;IADL's;Work    Rehab Potential  Good    OT Frequency  2x / week    OT Duration  4 weeks    OT Treatment/Interventions  Self-care/ADL training;Therapeutic exercise;Neuromuscular education;DME and/or AE instruction;Therapeutic activities;Visual/perceptual remediation/compensation;Patient/family education    Plan  address vestilbular/balance, address L coordination, writing,     Consulted and Agree with Plan of Care  Patient       Patient will benefit from skilled therapeutic intervention in order to improve the following deficits and impairments:  Decreased cognition, Decreased coordination, Decreased safety awareness, Impaired UE functional use, Impaired vision/preception  Visit Diagnosis: Other lack of coordination - Plan: Ot plan of care cert/re-cert  Visuospatial deficit - Plan: Ot plan of care cert/re-cert  Apraxia - Plan: Ot plan of care cert/re-cert  Other symptoms and signs involving cognitive functions following other cerebrovascular disease - Plan: Ot plan of care cert/re-cert    Problem List Patient Active Problem List   Diagnosis Date Noted  . Former smoker 03/22/2018  . Gait disturbance, post-stroke   . Embolic cerebral infarction (HCC) 03/09/2018  . Dyslipidemia   . Cerebrovascular accident (CVA) (HCC)   . Left-sided weakness   . Benign essential HTN   . Tobacco abuse   . Diastolic dysfunction   . Leukocytosis   . Acute ischemic stroke (HCC) 03/05/2018  . CKD (chronic kidney disease), stage II 03/05/2018  . Alcohol dependence (HCC) 03/05/2018  . Essential  hypertension 03/17/2017  . Abnormal kidney function 03/17/2017  . Hyperkalemia 03/17/2017  . Sleep disturbance 03/17/2017  . Sleep walking 03/17/2017  . Sebaceous cyst 03/17/2017  . Laceration of nose 03/17/2017    Norton Pastelulaski, Karen Halliday, OTR/L 04/19/2018, 9:36 AM  [facility] [address] [place], [place], [address] Phone: [phone]   Fax:  [phone]  Name: Thurnell LoseMichael Minkin MRN: [id number] Date of Birth: 07/13/1965

## 2018-04-19 NOTE — Patient Instructions (Signed)
  Think about how your attention, memory and weak left hand can affect your safety   With a brain injury, you may have more fatigue which can affect your safety at work - if you do go back, consider shorter days or listen to your body and take breaks  Your brain may have a harder time dealing with loud noises, bright lights, and busy places or fast group conversations. This can affect your attention (like a bright loud store, such as [employer], restaurants, [employer])   Double check yourself that you have everything before you leave the house and before you leave a store of restaurant  Bring in your BP logs and calendar you are crossing off  [place] playing cards with your neighbor - that is good brain work - keep it up!! Try different games- Spades  Watch your sodium with fast food - smaller portions of chips and soda -

## 2018-04-19 NOTE — Therapy (Signed)
[facility] [address] [place], [place], [address] Phone: [phone]   Fax:  [phone]  Speech Language Pathology Treatment  Patient Details  Name: Jesse Price MRN: [id number] Date of Birth: 04/29/1966 Referring Provider (SLP): Dr. Claudette LawsAndrew Kirsteins   Encounter Date: 04/19/2018  End of Session - 04/19/18 1234    Visit Number  3    Number of Visits  13    Date for SLP Re-Evaluation  05/16/18    SLP Start Time  1016    SLP Stop Time   1058    SLP Time Calculation (min)  42 min    Activity Tolerance  Patient tolerated treatment well       Past Medical History:  Diagnosis Date  . Asthma   . Hypertension     Past Surgical History:  Procedure Laterality Date  . LOOP RECORDER INSERTION N/A 03/08/2018   Procedure: LOOP RECORDER INSERTION;  Surgeon: Marinus Mawaylor, Gregg W, MD;  [facility];  Service: Cardiovascular;  Laterality: N/A;  . TEE WITHOUT CARDIOVERSION N/A 03/08/2018   Procedure: TRANSESOPHAGEAL ECHOCARDIOGRAM (TEE);  Surgeon: Jake BatheSkains, Mark C, MD;  [facility];  Service: Cardiovascular;  Laterality: N/A;    There were no vitals filed for this visit.  Subjective Assessment - 04/19/18 1020    Subjective  "I have been crossing off days but I didn't know I was coming today, so I didn't bring it"    Currently in Pain?  No/denies            ADULT SLP TREATMENT - 04/19/18 1021      General Information   Behavior/Cognition  Alert;Cooperative;Pleasant mood      Treatment Provided   Treatment provided  Cognitive-Linquistic      Cognitive-Linquistic Treatment   Treatment focused on  Cognition    Skilled Treatment  Pt reports keeping BP log as well as managing calendar, crossing off days.  His visit today was unplanned (he was added on after his OT visit) so he did not bring his calendar or log. 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 Pt reports he has been told to avoid sodium. He continues to eat fast food - we collaboratively looked up amount of sodium in foods he eats. Will continue to have pt keep track of sodium to improve awareness and health. Trained pt in use of note taking to write down 6 questions he has for his MD visit on Thrusday      Assessment / Recommendations / Plan   Plan  Continue with current plan of care      Progression Toward Goals   Progression toward goals  Progressing toward goals       SLP Education - 04/19/18 1231    Education Details  high sensory stim affects on attention/brain, reduce sodium, use note taking for ?'s for MD    Person(s) Educated  Patient    Methods  Explanation;Demonstration    Comprehension  Verbalized understanding;Verbal cues required         SLP Long Term Goals - 04/19/18 1234      SLP LONG TERM GOAL #1   Title  Pt will utilize external aids for appointment and daily schedule management with rare min A over 2 sessions  Time  3    Period  Weeks    Status  On-going      SLP LONG TERM GOAL #2   Title  Pt will complete daily blood pressure log with mod I over 3 sessions    Time  3    Period  Weeks    Status  On-going      SLP LONG TERM GOAL #3   Title  Pt will  generate 4 compensatory strategies for attention,  visual impairment and UE weakness for safety at work and driving  and report carryover of these over 3 sessions    Time  3    Period  Weeks    Status  On-going       Plan - 04/19/18 1232    Clinical Impression Statement  Jesse Price conitnues to require cues/training for safety awaress for possible return to work in future. He is logging his BP daily and utilizing calendar consistently to manage his schedule. Continue skilled ST to maximize carryover of compensations for memory  and attention for safey and possible return to work., as R hand improves.    Speech Therapy Frequency  2x / week    Treatment/Interventions  Compensatory strategies;Patient/family education;Functional tasks;Cognitive reorganization;Environmental controls;SLP instruction and feedback;Internal/external aids;Compensatory techniques;Language facilitation;Cueing hierarchy    Potential to Achieve Goals  Good       Patient will benefit from skilled therapeutic intervention in order to improve the following deficits and impairments:   Cognitive communication deficit    Problem List Patient Active Problem List   Diagnosis Date Noted  . Former smoker 03/22/2018  . Gait disturbance, post-stroke   . Embolic cerebral infarction (HCC) 03/09/2018  . Dyslipidemia   . Cerebrovascular accident (CVA) (HCC)   . Left-sided weakness   . Benign essential HTN   . Tobacco abuse   . Diastolic dysfunction   . Leukocytosis   . Acute ischemic stroke (HCC) 03/05/2018  . CKD (chronic kidney disease), stage II 03/05/2018  . Alcohol dependence (HCC) 03/05/2018  . Essential hypertension 03/17/2017  . Abnormal kidney function 03/17/2017  . Hyperkalemia 03/17/2017  . Sleep disturbance 03/17/2017  . Sleep walking 03/17/2017  . Sebaceous cyst 03/17/2017  . Laceration of nose 03/17/2017    Jesse Price, Jesse Journey MS, CCC-SLP 04/19/2018, 12:35 PM  [facility] [address] [place], [place], [address] Phone: [phone]   Fax:  [phone]   Name: Jesse Price MRN: [id number] Date of Birth: 10/09/1965

## 2018-04-20 ENCOUNTER — Ambulatory Visit: Payer: Commercial Managed Care - PPO

## 2018-04-21 ENCOUNTER — Ambulatory Visit: Payer: Commercial Managed Care - PPO | Admitting: Occupational Therapy

## 2018-04-21 ENCOUNTER — Encounter: Payer: Self-pay | Admitting: Occupational Therapy

## 2018-04-21 ENCOUNTER — Ambulatory Visit: Payer: Commercial Managed Care - PPO | Admitting: Speech Pathology

## 2018-04-21 ENCOUNTER — Ambulatory Visit: Payer: Commercial Managed Care - PPO | Admitting: Medical

## 2018-04-21 VITALS — BP 135/94

## 2018-04-21 DIAGNOSIS — R278 Other lack of coordination: Secondary | ICD-10-CM

## 2018-04-21 DIAGNOSIS — R41841 Cognitive communication deficit: Secondary | ICD-10-CM

## 2018-04-21 DIAGNOSIS — I69818 Other symptoms and signs involving cognitive functions following other cerebrovascular disease: Secondary | ICD-10-CM

## 2018-04-21 DIAGNOSIS — R482 Apraxia: Secondary | ICD-10-CM

## 2018-04-21 DIAGNOSIS — R41842 Visuospatial deficit: Secondary | ICD-10-CM

## 2018-04-21 NOTE — Therapy (Signed)
[facility] [address] [place], [place], [address] Phone: [phone]   Fax:  [phone]  Occupational Therapy Treatment  Patient Details  Name: Jesse Price MRN: [id number] Date of Birth: 1965-05-31 Referring Provider (OT): Dr. Claudette Laws   Encounter Date: 04/21/2018  OT End of Session - 04/21/18 0930    Visit Number  5    Date for OT Re-Evaluation  05/17/18    Authorization Type  [employer]    Authorization Time Period  20 visit limit pt has used 0/20 before today    OT Start Time  0847    OT Stop Time  0929    OT Time Calculation (min)  42 min    Activity Tolerance  Patient tolerated treatment well       Past Medical History:  Diagnosis Date  . Asthma   . Hypertension     Past Surgical History:  Procedure Laterality Date  . LOOP RECORDER INSERTION N/A 03/08/2018   Procedure: LOOP RECORDER INSERTION;  Surgeon: Marinus Maw, MD;  Location: [facility] INVASIVE CV LAB;  Service: Cardiovascular;  Laterality: N/A;  . TEE WITHOUT CARDIOVERSION N/A 03/08/2018   Procedure: TRANSESOPHAGEAL ECHOCARDIOGRAM (TEE);  Surgeon: Jake Bathe, MD;  Location: [facility] ENDOSCOPY;  Service: Cardiovascular;  Laterality: N/A;    Vitals:   04/21/18 0930  BP: (!) 135/94    Subjective Assessment - 04/21/18 0852    Subjective   I feel a little dizzy today - I am not sure why    Pertinent History  PMH:  Pt with R frontal CVA on 03/05/2018. Inpt rehab d/c home on 03/15/2018  multiple chronic infarcts bilaterally, tobacco, +ETOH use, HTN, HLD, chronic kidney disease Stage II    Patient Stated Goals  to get my hand working properly - I am left handed.     Currently in Pain?  No/denies                   OT Treatments/Exercises (OP) - 04/21/18 0001      ADLs   ADL Comments  Pt reports at work he uses multiple tools - will assess next session.       Neurological Re-education Exercises   Other Exercises 1   Addresed in hand manipulation with L hand using [employer] Peg board activities (not timed) working on manipulating 2 small items. Pt with moderate difficulty. and required signifcantly increased time however was able to complete the tasks.  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 Also utilized silver balls for manipulation - pt initially required hand over hand however with repetition was able to complete 4 trials with maximal effort.                   OT Long Term Goals - 04/19/18 4742      OT LONG TERM GOAL #1   Title  Pt will be mod I with HEP for coordination, visual vestibular exercises - 05/17/2018 (pt renewed as he had only attended 4 sessions as of 04/19/2018    Status  On-going      OT LONG TERM GOAL #2   Title  Pt will demonstrate improved coordination as evidenced by decreasing time on 9hole peg test for LUE by at least 8 seconds (baseline=45.63) to assist with ADL and IADL tasks    Status  Achieved   12/10 27.64     OT LONG TERM GOAL #  3   Title  Pt will report no greater than 3/10 intensity for visual vestibular symptoms with horizontal head turns during home mgmt tasks.     Status  On-going      OT LONG TERM GOAL #4   Title  Pt will report greater ease in cutting food, writing and tying shoes    Status  Achieved      OT LONG TERM GOAL #5   Title  Pt will demonstrate environmental scanning during functional task with 90% accuracy for home mgmt and shopping tasks.     Status  Achieved            Plan - 04/21/18 0929    Clinical Impression Statement  Pt continues to make progress toward goals with continued improvement in hand function.     Occupational Profile and client history currently impacting functional performance  PMH:  HTN, HLD, chronic kidney disease Stage II, tobacoo and alcohol use.  Multiple chronic brain infarcts, bilaterally.    Occupational performance deficits (Please refer to evaluation  for details):  ADL's;IADL's;Work    Rehab Potential  Good    OT Frequency  2x / week    OT Duration  4 weeks    OT Treatment/Interventions  Self-care/ADL training;Therapeutic exercise;Neuromuscular education;DME and/or AE instruction;Therapeutic activities;Visual/perceptual remediation/compensation;Patient/family education    Plan  address vestilbular/balance, address L coordination, writing,        Patient will benefit from skilled therapeutic intervention in order to improve the following deficits and impairments:  Decreased cognition, Decreased coordination, Decreased safety awareness, Impaired UE functional use, Impaired vision/preception  Visit Diagnosis: Other lack of coordination  Visuospatial deficit  Apraxia  Other symptoms and signs involving cognitive functions following other cerebrovascular disease    Problem List Patient Active Problem List   Diagnosis Date Noted  . Former smoker 03/22/2018  . Gait disturbance, post-stroke   . Embolic cerebral infarction (HCC) 03/09/2018  . Dyslipidemia   . Cerebrovascular accident (CVA) (HCC)   . Left-sided weakness   . Benign essential HTN   . Tobacco abuse   . Diastolic dysfunction   . Leukocytosis   . Acute ischemic stroke (HCC) 03/05/2018  . CKD (chronic kidney disease), stage II 03/05/2018  . Alcohol dependence (HCC) 03/05/2018  . Essential hypertension 03/17/2017  . Abnormal kidney function 03/17/2017  . Hyperkalemia 03/17/2017  . Sleep disturbance 03/17/2017  . Sleep walking 03/17/2017  . Sebaceous cyst 03/17/2017  . Laceration of nose 03/17/2017    Norton Pastelulaski, Karen Halliday, OTR/L 04/21/2018, 9:31 AM  [facility] [address] [place], [place], [address] Phone: [phone]   Fax:  [phone]  Name: Jesse LoseMichael Price MRN: [id number] Date of Birth: Sep 24, 1965

## 2018-04-21 NOTE — Patient Instructions (Signed)
Bring your calendar with you when you come to therapy appointments.  Today we practiced using [employer] to help you set reminders for appointments. If you can't write something down right away, try setting a reminder in your phone so that you get an alert about it.  Setting a Reminder - What you want to be reminded about (be specific) - What time - What date  Take your list of questions to Dr. Aleen Campiysinger.

## 2018-04-21 NOTE — Therapy (Signed)
[facility] [address] [place], [place], [address] Phone: [phone]   Fax:  [phone]  Speech Language Pathology Treatment  Patient Details  Name: Jesse LoseMichael Price MRN: [id number] Date of Birth: 07/01/1965 Referring Provider (SLP): Dr. Claudette LawsAndrew Kirsteins   Encounter Date: 04/21/2018  End of Session - 04/21/18 1059    Visit Number  4    Number of Visits  13    Date for SLP Re-Evaluation  05/16/18    SLP Start Time  0930    SLP Stop Time   1010    SLP Time Calculation (min)  40 min    Activity Tolerance  Patient tolerated treatment well       Past Medical History:  Diagnosis Date  . Asthma   . Hypertension     Past Surgical History:  Procedure Laterality Date  . LOOP RECORDER INSERTION N/A 03/08/2018   Procedure: LOOP RECORDER INSERTION;  Surgeon: Marinus Mawaylor, Gregg W, MD;  [facility];  Service: Cardiovascular;  Laterality: N/A;  . TEE WITHOUT CARDIOVERSION N/A 03/08/2018   Procedure: TRANSESOPHAGEAL ECHOCARDIOGRAM (TEE);  Surgeon: Jake BatheSkains, Mark C, MD;  [facility];  Service: Cardiovascular;  Laterality: N/A;    There were no vitals filed for this visit.  Subjective Assessment - 04/21/18 1051    Subjective  Pt arrives without calendar    Currently in Pain?  No/denies            ADULT SLP TREATMENT - 04/21/18 0930      General Information   Behavior/Cognition  Alert;Cooperative;Pleasant mood      Treatment Provided   Treatment provided  Cognitive-Linquistic      Pain Assessment   Pain Assessment  No/denies pain      Cognitive-Linquistic Treatment   Treatment focused on  Cognition    Skilled Treatment  Pt did not bring calendar. He also had no recollection of MD appointment scheduled for 12:00 today, despite having discussed it with therapist during previous session and written a list of questions to ask his doctor. Max cues for intellectual awareness of memory  impairment, and to write down details of his appointment when given verbally by therapist (pt initially pushed post-it over to SLP vs initiating recording information). Pt was unaware of location of the appointment, mod cues to problem solve to search online for location and call office to confirm location. When pt called to confirm, office requested to reschedule pt's appointment, and pt required mod-max cues to record information pertaining to new appointment (time, date, location). Worked with pt on using his phone to set voice-activated reminders for upcoming events (appointments, and reminders to bring materials to the appointments). Initial mod A, fading to occasional min A for specificity (type, time, date).       Assessment / Recommendations / Plan   Plan  Continue with current plan of care      Progression Toward Goals   Progression toward goals  Progressing toward goals       SLP Education - 04/21/18 1058    Education Details  deficit areas, need to use external aids for reminders    Person(s) Educated  Patient    Methods  Explanation;Handout    Comprehension  Verbalized understanding;Returned demonstration;Need further instruction;Verbal cues required         SLP Long Term Goals - 04/21/18 1053      SLP LONG TERM GOAL #1   Title  Pt will utilize external aids for appointment and  daily schedule management with rare min A over 2 sessions    Time  3    Status  On-going      SLP LONG TERM GOAL #2   Title  Pt will complete daily blood pressure log with mod I over 3 sessions    Time  3    Period  Weeks    Status  On-going      SLP LONG TERM GOAL #3   Title  Pt will  generate 4 compensatory strategies for attention,  visual impairment and UE weakness for safety at work and driving  and report carryover of these over 3 sessions    Time  3    Period  Weeks    Status  On-going       Plan - 04/21/18 1059    Clinical Impression Statement  [name] continues to require  cues/training for safety awaress for possible return to work in future. He reports he is logging his BP daily and utilizing calendar consistently to manage his schedule; max cues required today for awareness of deficits. Pt did not recall an appointment scheduled today or his discussion 2 days ago with therapist about the appointment. Continue skilled ST to maximize carryover of compensations for memory and attention for safey and possible return to work., as R hand improves.    Speech Therapy Frequency  2x / week    Duration  --   13 visits or 6 weeks   Treatment/Interventions  Compensatory strategies;Patient/family education;Functional tasks;Cognitive reorganization;Environmental controls;SLP instruction and feedback;Internal/external aids;Compensatory techniques;Language facilitation;Cueing hierarchy    Potential to Achieve Goals  Good       Patient will benefit from skilled therapeutic intervention in order to improve the following deficits and impairments:   Cognitive communication deficit    Problem List Patient Active Problem List   Diagnosis Date Noted  . Former smoker 03/22/2018  . Gait disturbance, post-stroke   . Embolic cerebral infarction (HCC) 03/09/2018  . Dyslipidemia   . Cerebrovascular accident (CVA) (HCC)   . Left-sided weakness   . Benign essential HTN   . Tobacco abuse   . Diastolic dysfunction   . Leukocytosis   . Acute ischemic stroke (HCC) 03/05/2018  . CKD (chronic kidney disease), stage II 03/05/2018  . Alcohol dependence (HCC) 03/05/2018  . Essential hypertension 03/17/2017  . Abnormal kidney function 03/17/2017  . Hyperkalemia 03/17/2017  . Sleep disturbance 03/17/2017  . Sleep walking 03/17/2017  . Sebaceous cyst 03/17/2017  . Laceration of nose 03/17/2017   Rondel Baton, MS, CCC-SLP Speech-Language Pathologist  Arlana Lindau 04/21/2018, 11:02 AM  [facility] [address] [place], [place], [address] Phone: [phone]   Fax:  [phone]   Name: Jesse Price MRN: [id number] Date of Birth: February 21, 1966

## 2018-04-25 ENCOUNTER — Ambulatory Visit: Payer: Commercial Managed Care - PPO | Admitting: Occupational Therapy

## 2018-04-25 ENCOUNTER — Ambulatory Visit: Payer: Commercial Managed Care - PPO | Admitting: Speech Pathology

## 2018-04-25 ENCOUNTER — Encounter: Payer: Self-pay | Admitting: Occupational Therapy

## 2018-04-25 DIAGNOSIS — I69818 Other symptoms and signs involving cognitive functions following other cerebrovascular disease: Secondary | ICD-10-CM

## 2018-04-25 DIAGNOSIS — R41842 Visuospatial deficit: Secondary | ICD-10-CM

## 2018-04-25 DIAGNOSIS — R278 Other lack of coordination: Secondary | ICD-10-CM

## 2018-04-25 DIAGNOSIS — R41841 Cognitive communication deficit: Secondary | ICD-10-CM

## 2018-04-25 DIAGNOSIS — R482 Apraxia: Secondary | ICD-10-CM

## 2018-04-25 NOTE — Therapy (Signed)
[facility] [address] [place], [place], [address] Phone: [phone]   Fax:  [phone]  Speech Language Pathology Treatment  Patient Details  Name: Jesse Price MRN: [id number] Date of Birth: 1965/10/10 Referring Provider (SLP): Dr. Claudette LawsAndrew Price   Encounter Date: 04/25/2018  End of Session - 04/25/18 1158    Visit Number  5    Number of Visits  13    Date for SLP Re-Evaluation  05/16/18    SLP Start Time  0930    SLP Stop Time   1014    SLP Time Calculation (min)  44 min    Activity Tolerance  Patient tolerated treatment well       Past Medical History:  Diagnosis Date  . Asthma   . Hypertension     Past Surgical History:  Procedure Laterality Date  . LOOP RECORDER INSERTION N/A 03/08/2018   Procedure: LOOP RECORDER INSERTION;  Surgeon: Marinus Mawaylor, Gregg W, MD;  [facility];  Service: Cardiovascular;  Laterality: N/A;  . TEE WITHOUT CARDIOVERSION N/A 03/08/2018   Procedure: TRANSESOPHAGEAL ECHOCARDIOGRAM (TEE);  Surgeon: Jake BatheSkains, Mark C, MD;  [facility];  Service: Cardiovascular;  Laterality: N/A;    There were no vitals filed for this visit.  Subjective Assessment - 04/25/18 0931    Subjective  "I got my little reminder up on my phone about tomorrow."    Currently in Pain?  No/denies            ADULT SLP TREATMENT - 04/25/18 0931      General Information   Behavior/Cognition  Alert;Cooperative;Pleasant mood      Treatment Provided   Treatment provided  Cognitive-Linquistic      Pain Assessment   Pain Assessment  No/denies pain      Cognitive-Linquistic Treatment   Treatment focused on  Cognition    Skilled Treatment  Pt brought his calendar/blood pressure log today. Pt had blood pressure recorded and days crossed off with exception of current date. Recalled upcoming appointments, showing SLP his phone calendar and was able to state time/ location of his  upcoming appointment. Pt does not have a binder for therapy materials; SLP re-educated re: obtaining a binder or folder to organize his therapy materials. 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 Pt reports he is "trying to watch it," with sodium and is not adding additional salt to foods, but also states he continues to eat high sodium foods such as potato chips and fast food on a daily basis. In a previous session pt looked up sodium amounts in foods he typically eats. SLP had pt generate a list of lower sodium options at restaurants he frequents. Occasional min cues to redirect attention to current task when using the internet on his phone.       Assessment / Recommendations / Plan   Plan  Continue with current plan of care      Progression Toward Goals   Progression toward goals  Progressing toward goals           SLP Long Term Goals - 04/25/18 0934      SLP LONG TERM GOAL #1   Title  Pt will utilize external aids for appointment and daily schedule management with rare min A over 2 sessions    Baseline  04/25/18    Time  2    Period  Weeks    Status  On-going  SLP LONG TERM GOAL #2   Title  Pt will complete daily blood pressure log with mod I over 3 sessions    Baseline  04/25/18    Time  2    Period  Weeks    Status  On-going      SLP LONG TERM GOAL #3   Title  Pt will generate 4 compensatory strategies for attention, visual impairment and UE weakness for safety at work and driving  and report carryover of these over 3 sessions    Time  2    Period  Weeks    Status  On-going       Plan - 04/25/18 1159    Clinical Impression Statement  [name] continues to require cues/training for safety awaress for possible return to work in future. He reports he is logging his BP daily and utilizing calendar consistently to manage his schedule; mod- max cues required today for awareness of deficits. Continue  skilled ST to maximize carryover of compensations for memory and attention for safey and possible return to work., as R hand improves.    Speech Therapy Frequency  2x / week    Duration  --   13 visits or 6 weeks   Treatment/Interventions  Compensatory strategies;Patient/family education;Functional tasks;Cognitive reorganization;Environmental controls;SLP instruction and feedback;Internal/external aids;Compensatory techniques;Language facilitation;Cueing hierarchy    Potential to Achieve Goals  Good       Patient will benefit from skilled therapeutic intervention in order to improve the following deficits and impairments:   Cognitive communication deficit    Problem List Patient Active Problem List   Diagnosis Date Noted  . Former smoker 03/22/2018  . Gait disturbance, post-stroke   . Embolic cerebral infarction (HCC) 03/09/2018  . Dyslipidemia   . Cerebrovascular accident (CVA) (HCC)   . Left-sided weakness   . Benign essential HTN   . Tobacco abuse   . Diastolic dysfunction   . Leukocytosis   . Acute ischemic stroke (HCC) 03/05/2018  . CKD (chronic kidney disease), stage II 03/05/2018  . Alcohol dependence (HCC) 03/05/2018  . Essential hypertension 03/17/2017  . Abnormal kidney function 03/17/2017  . Hyperkalemia 03/17/2017  . Sleep disturbance 03/17/2017  . Sleep walking 03/17/2017  . Sebaceous cyst 03/17/2017  . Laceration of nose 03/17/2017   Jesse Baton, MS, CCC-SLP Speech-Language Pathologist   Jesse Price 04/25/2018, 12:00 PM  [facility] [address] [place], [place], [address] Phone: [phone]   Fax:  [phone]   Name: Jesse Price MRN: [id number] Date of Birth: 27-Sep-1965

## 2018-04-25 NOTE — Patient Instructions (Signed)
Home program for your dizziness: You only need to do these two exercise at home now for dizziness.  1. Continue with the ball one you are doing in standing.  Just do this 1 time per day now.  Do 5 repetitions.  2.  Add this one:  Place two pillows on the floor in a CORNER (use the corner so you can steady on the walls if you need to).  Stand on the pillows with your back facing the wall.  Put your feet a comfortable distance apart. Close your eyes.  Turn your head right and then left 5 times.  Open your eyes and let the dizziness COMPLETELY STOP.  Then repeat. Do this 3 times.  Do this 2-3 times per day (for example morning, mid afternoon and early evening).    Keep working on your coordination exercises for your left hand.

## 2018-04-25 NOTE — Therapy (Signed)
[facility] [address] [place], [place], [address] Phone: [phone]   Fax:  [phone]  Occupational Therapy Treatment  Patient Details  Name: Jesse Price MRN: [id number] Date of Birth: June 14, 1965 Referring Provider (OT): Dr. Claudette Laws   Encounter Date: 04/25/2018  OT End of Session - 04/25/18 1629    Visit Number  6    Number of Visits  16    Date for OT Re-Evaluation  05/17/18    Authorization Type  [employer]    Authorization Time Period  20 visit limit pt has used 0/20 before today    Authorization - Visit Number  6    Authorization - Number of Visits  20    OT Start Time  0847    OT Stop Time  0930    OT Time Calculation (min)  43 min    Activity Tolerance  Patient tolerated treatment well       Past Medical History:  Diagnosis Date  . Asthma   . Hypertension     Past Surgical History:  Procedure Laterality Date  . LOOP RECORDER INSERTION N/A 03/08/2018   Procedure: LOOP RECORDER INSERTION;  Surgeon: Marinus Maw, MD;  [facility]: [facility] INVASIVE CV LAB;  Service: Cardiovascular;  Laterality: N/A;  . TEE WITHOUT CARDIOVERSION N/A 03/08/2018   Procedure: TRANSESOPHAGEAL ECHOCARDIOGRAM (TEE);  Surgeon: Jake Bathe, MD;  Location: [facility]OSCOPY;  Service: Cardiovascular;  Laterality: N/A;    There were no vitals filed for this visit.  Subjective Assessment - 04/25/18 0852    Pertinent History  PMH:  Pt with R frontal CVA on 03/05/2018. Inpt rehab d/c home on 03/15/2018  multiple chronic infarcts bilaterally, tobacco, +ETOH use, HTN, HLD, chronic kidney disease Stage II    Patient Stated Goals  to get my hand working properly - I am left handed.     Currently in Pain?  No/denies                   OT Treatments/Exercises (OP) - 04/25/18 0001      Neurological Re-education Exercises   Other Exercises 1  Addressed functional use of L dominant hand with use of tools  including using L hand for maniupulation of nuts and bolts for OT work bench, use of hammer and use of screw driver.  Pt with minimal difficulty with smalle nuts and bolts for OT work bench.  Pt did not have any more difficulty with vision occluded.  Pt able to use screw driver and hammer with no difficulty.  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 Pt reports he always has gloves on when using welding equipment.     Other Exercises 2  Addresesd balance on uneven surfaces with eyes closed with horizontal head turns.  Incorporated into HEP and pt able to return demonstrate.  Pt with slolwly improving balance.               OT Education - 04/25/18 1627    Education Details  Upgraded HEP for visual vestibular - see pt instruction section    Person(s) Educated  Patient    Methods  Explanation;Demonstration;Verbal cues;Handout    Comprehension  Verbalized understanding;Returned demonstration          OT Long Term Goals - 04/25/18 1627      OT LONG TERM GOAL #1   Title  Pt will be mod I with HEP  for coordination, visual vestibular exercises - 05/17/2018 (pt renewed as he had only attended 4 sessions as of 04/19/2018    Status  On-going      OT LONG TERM GOAL #2   Title  Pt will demonstrate improved coordination as evidenced by decreasing time on 9hole peg test for LUE by at least 8 seconds (baseline=45.63) to assist with ADL and IADL tasks    Status  Achieved   12/10 27.64     OT LONG TERM GOAL #3   Title  Pt will report no greater than 3/10 intensity for visual vestibular symptoms with horizontal head turns during home mgmt tasks.     Status  On-going      OT LONG TERM GOAL #4   Title  Pt will report greater ease in cutting food, writing and tying shoes    Status  Achieved      OT LONG TERM GOAL #5   Title  Pt will demonstrate environmental scanning during functional task with 90% accuracy for home mgmt and shopping tasks.      Status  Achieved            Plan - 04/25/18 1628    Clinical Impression Statement  Pt continues to progress toward goals. Pt able to particpate in more challening HEP for balance.     Occupational Profile and client history currently impacting functional performance  PMH:  HTN, HLD, chronic kidney disease Stage II, tobacoo and alcohol use.  Multiple chronic brain infarcts, bilaterally.    Occupational performance deficits (Please refer to evaluation for details):  ADL's;IADL's;Work    Rehab Potential  Good    OT Frequency  2x / week    OT Duration  4 weeks    OT Treatment/Interventions  Self-care/ADL training;Therapeutic exercise;Neuromuscular education;DME and/or AE instruction;Therapeutic activities;Visual/perceptual remediation/compensation;Patient/family education    Plan  address vestilbular/balance, address L coordination, writing,     Consulted and Agree with Plan of Care  Patient       Patient will benefit from skilled therapeutic intervention in order to improve the following deficits and impairments:  Decreased cognition, Decreased coordination, Decreased safety awareness, Impaired UE functional use, Impaired vision/preception  Visit Diagnosis: Other lack of coordination  Visuospatial deficit  Apraxia  Other symptoms and signs involving cognitive functions following other cerebrovascular disease    Problem List Patient Active Problem List   Diagnosis Date Noted  . Former smoker 03/22/2018  . Gait disturbance, post-stroke   . Embolic cerebral infarction (HCC) 03/09/2018  . Dyslipidemia   . Cerebrovascular accident (CVA) (HCC)   . Left-sided weakness   . Benign essential HTN   . Tobacco abuse   . Diastolic dysfunction   . Leukocytosis   . Acute ischemic stroke (HCC) 03/05/2018  . CKD (chronic kidney disease), stage II 03/05/2018  . Alcohol dependence (HCC) 03/05/2018  . Essential hypertension 03/17/2017  . Abnormal kidney function 03/17/2017  .  Hyperkalemia 03/17/2017  . Sleep disturbance 03/17/2017  . Sleep walking 03/17/2017  . Sebaceous cyst 03/17/2017  . Laceration of nose 03/17/2017    Norton Pastelulaski,  Halliday, OTR/L 04/25/2018, 4:30 PM  [facility] [address] [place], [place], [address] Phone: [phone]   Fax:  [phone]  Name: Jesse Price MRN: [id number] Date of Birth: [birth year]

## 2018-04-26 ENCOUNTER — Encounter: Payer: Self-pay | Admitting: Medical

## 2018-04-26 ENCOUNTER — Telehealth: Payer: Self-pay | Admitting: Medical

## 2018-04-26 ENCOUNTER — Ambulatory Visit (INDEPENDENT_AMBULATORY_CARE_PROVIDER_SITE_OTHER): Payer: Commercial Managed Care - PPO | Admitting: Medical

## 2018-04-26 VITALS — BP 130/78 | HR 79 | Temp 97.9°F | Ht 66.5 in | Wt 177.4 lb

## 2018-04-26 DIAGNOSIS — R531 Weakness: Secondary | ICD-10-CM | POA: Diagnosis not present

## 2018-04-26 DIAGNOSIS — I639 Cerebral infarction, unspecified: Secondary | ICD-10-CM

## 2018-04-26 DIAGNOSIS — I10 Essential (primary) hypertension: Secondary | ICD-10-CM

## 2018-04-26 DIAGNOSIS — N182 Chronic kidney disease, stage 2 (mild): Secondary | ICD-10-CM | POA: Diagnosis not present

## 2018-04-26 DIAGNOSIS — Z87891 Personal history of nicotine dependence: Secondary | ICD-10-CM

## 2018-04-26 DIAGNOSIS — R29898 Other symptoms and signs involving the musculoskeletal system: Secondary | ICD-10-CM

## 2018-04-26 DIAGNOSIS — E785 Hyperlipidemia, unspecified: Secondary | ICD-10-CM

## 2018-04-26 DIAGNOSIS — H539 Unspecified visual disturbance: Secondary | ICD-10-CM | POA: Insufficient documentation

## 2018-04-26 NOTE — Progress Notes (Signed)
Subjective: Chief Complaint  Patient presents with  . Abnormal Kidney Function    1 month follow up    Here for 1 month recheck.  He was recently hospitalized 03/06/2018 through 03/15/2018.  He suffered a stroke on March 05, 2018.  Since last visit he continues to do speech therapy and Occupational Therapy.  Occupational Therapy has told him he still needs probably another month of therapy.  He has made progress, feels like he is doing well.  He is left-handed.  He is a Psychologist, occupationalwelder.  He still is having difficulty with the left fourth and fifth fingers and strength of the left hand.  He remains tobacco free since quitting recently from hospitalization.  He is compliant with his medications.    On 03/05/2018, acute symptoms initially included left-sided weakness, and otherwise ambulatory male prior.  He notes that he did not receive medical attention until it day after symptoms given that he was by himself at home and sister found him the next day.  He was diagnosed with a right frontal lobe infarction, hypertension, hyperlipidemia, tobacco use, chronic kidney disease  He still drinks several alcoholic beverages per week  He denies any new symptoms since discharge    Past Medical History:  Diagnosis Date  . Asthma   . Hypertension    Current Outpatient Medications on File Prior to Visit  Medication Sig Dispense Refill  . amLODipine (NORVASC) 5 MG tablet Take 1 tablet (5 mg total) by mouth daily. 30 tablet 2  . aspirin 325 MG tablet Take 1 tablet (325 mg total) by mouth daily. 30 tablet 2  . Multiple Vitamin (MULTIVITAMIN WITH MINERALS) TABS tablet Take 1 tablet by mouth daily. 30 tablet 2  . atorvastatin (LIPITOR) 80 MG tablet Take 1 tablet (80 mg total) by mouth daily at 6 PM. (Patient not taking: Reported on 04/26/2018) 30 tablet 2  . folic acid (FOLVITE) 1 MG tablet Take 1 tablet (1 mg total) by mouth daily. (Patient not taking: Reported on 04/26/2018) 30 tablet 2  . thiamine 100 MG  tablet Take 1 tablet (100 mg total) by mouth daily. (Patient not taking: Reported on 04/26/2018) 30 tablet 2   No current facility-administered medications on file prior to visit.    ROS as in subjective   Objective: BP 130/78   Pulse 79   Temp 97.9 F (36.6 C) (Oral)   Ht 5' 6.5" (1.689 m)   Wt 177 lb 6.4 oz (80.5 kg)   SpO2 97%   BMI 28.20 kg/m   BP Readings from Last 3 Encounters:  04/26/18 130/78  04/21/18 (!) 135/94  03/24/18 122/78     General appearance: alert, no distress, WD/WN, AA male Neck: supple, no lymphadenopathy, no thyromegaly, no masses, no bruits Heart: RRR, normal S1, S2, no murmurs Lungs: CTA bilaterally, no wheezes, rhonchi, or rales Musculoskeletal: nontender, no swelling, no obvious deformity Extremities: no edema, no cyanosis, no clubbing Pulses: 2+ symmetric, upper and lower extremities, normal cap refill Neurological: alert, oriented x 3, CN2-12 intact, left arm /hand grip and finger strength is reduced and somewhat discoordinated with thumb to finger opposition, arm and leg strength in general 4-5/5 compared to 5/5 right side, otherwise strength normal upper extremities and lower extremities, sensation normal throughout, DTRs 2+ throughout, no cerebellar signs, gait normal Psychiatric: normal affect, behavior normal, pleasant      Assessment: Encounter Diagnoses  Name Primary?  . Cerebrovascular accident (CVA), unspecified mechanism (HCC) Yes  . Left-sided weakness   .  Essential hypertension   . CKD (chronic kidney disease), stage II   . Dyslipidemia   . Former smoker   . Visual disturbance   . Left hand weakness      Plan: He is doing better, improving with left hand strength and coordination.  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  He will continue occupational therapy and speech therapy.  For some reason there was a miscommunication on referrals from last visit so today we will make sure referrals are done for  neurology, cardiology, and ophthalmology.  I wrote him a work note out the next 30 days that he will submit to human resources since he is currently on short-term disability.  I reviewed his recent discharge summary paperwork, imaging, labs, EKG.  I reviewed the medication reconciliation.  He quit tobacco with this hospitalization  Advised to limit alcohol, make diet changes eating more low-fat heart healthy diet.  Can walk for exercise.    Leith was seen today for abnormal kidney function.  Diagnoses and all orders for this visit:  Cerebrovascular accident (CVA), unspecified mechanism (HCC) -     Ambulatory referral to Neurology -     Ambulatory referral to Cardiology -     Ambulatory referral to Ophthalmology  Left-sided weakness -     Ambulatory referral to Neurology -     Ambulatory referral to Cardiology  Essential hypertension -     Ambulatory referral to Cardiology  CKD (chronic kidney disease), stage II  Dyslipidemia  Former smoker  Visual disturbance -     Ambulatory referral to Ophthalmology  Left hand weakness

## 2018-04-26 NOTE — Telephone Encounter (Signed)
He needs 3 referrals.  He had a stroke on March 05, 2018.    He needs [facility]  referral to cardiology Dr. Gilman SchmidtGregory Taylor  needs to be referred to an eye doctor such [facility] or [facility] for example

## 2018-04-27 NOTE — Telephone Encounter (Signed)
All referrals has been sent

## 2018-04-28 ENCOUNTER — Encounter: Payer: Self-pay | Admitting: Speech Pathology

## 2018-04-28 ENCOUNTER — Ambulatory Visit: Payer: Commercial Managed Care - PPO | Admitting: Occupational Therapy

## 2018-04-28 ENCOUNTER — Encounter: Payer: Self-pay | Admitting: Occupational Therapy

## 2018-04-28 ENCOUNTER — Ambulatory Visit: Payer: Commercial Managed Care - PPO | Admitting: Speech Pathology

## 2018-04-28 DIAGNOSIS — R278 Other lack of coordination: Secondary | ICD-10-CM

## 2018-04-28 DIAGNOSIS — R41842 Visuospatial deficit: Secondary | ICD-10-CM

## 2018-04-28 DIAGNOSIS — R41841 Cognitive communication deficit: Secondary | ICD-10-CM | POA: Diagnosis not present

## 2018-04-28 NOTE — Patient Instructions (Signed)
   If you haven't heard about your 3 appointments, call Dr. Adelene Idlerysinger's office after Christmas  Cardiology (heart doctor) Neurology (brain doctor) Opthamology (eye doctor)  Write down their names Dr. ____________________(heart) Dr. _________________(eye) Dr. _________________(brain)  When you get these appointments, put them in your phone, then double check to make sure you put it in correctly  Low Sodium diet helps to keep your blood pressure lower   Pull up menus and check sodium of items, even before you go to make a low sodium choice  [employer] Apple Cider Dressing [employer] Oil and Vinegar Dressing  Check sodium in dressings

## 2018-04-28 NOTE — Therapy (Signed)
[facility] [address] [place], [place], [address] Phone: [phone]   Fax:  [phone]  Occupational Therapy Treatment  Patient Details  Name: Jesse Price MRN: [id number] Date of Birth: [birth year] Referring Provider (OT): Dr. Claudette LawsAndrew Kirsteins   Encounter Date: 04/28/2018  OT End of Session - 04/28/18 1038    Visit Number  7    Number of Visits  16    Date for OT Re-Evaluation  05/17/18    Authorization Type  [employer]    Authorization Time Period  20 visit limit pt has used 0/20 before today    Authorization - Visit Number  7    Authorization - Number of Visits  20    OT Start Time  0848    OT Stop Time  0930    OT Time Calculation (min)  42 min    Activity Tolerance  Patient tolerated treatment well       Past Medical History:  Diagnosis Date  . Asthma   . Hypertension     Past Surgical History:  Procedure Laterality Date  . LOOP RECORDER INSERTION N/A 03/08/2018   Procedure: LOOP RECORDER INSERTION;  Surgeon: Marinus Mawaylor, Gregg W, MD;  [facility];  Service: Cardiovascular;  Laterality: N/A;  . TEE WITHOUT CARDIOVERSION N/A 03/08/2018   Procedure: TRANSESOPHAGEAL ECHOCARDIOGRAM (TEE);  Surgeon: Jake BatheSkains, Mark C, MD;  [facility];  Service: Cardiovascular;  Laterality: N/A;    There were no vitals filed for this visit.  Subjective Assessment - 04/28/18 0851    Subjective   My vision is still getting blurry    Pertinent History  PMH:  Pt with R frontal CVA on 03/05/2018. Inpt rehab d/c home on 03/15/2018  multiple chronic infarcts bilaterally, tobacco, +ETOH use, HTN, HLD, chronic kidney disease Stage II    Patient Stated Goals  to get my hand working properly - I am left handed.     Currently in Pain?  No/denies                   OT Treatments/Exercises (OP) - 04/28/18 0001      Hand Exercises   Other Hand Exercises  Therapeutic activities to address  in hand manipulation with 2 smal objects - pt with improved performance today. Pt reports he also used welding tool at home as well as drill and did not experience in any difficulty.       Neurological Re-education Exercises   Other Exercises 2  Assessed symptoms with horizontal head turns with walking. Pt today reports intensity of 4/10 and symptoms (blurry vision only symptom at this time) lasted approximately 30 seconds.  Assessed all other gaze stabilization parameters - pt only experiences this with horizontal head turns. Modified home vestibular program to include walking with head turns. Pt also working in corner on pillows with horizontal head turns with eyes closed.  Pt reports "I haven't really been doing the corner ones."  Pt also doing circle with ball (#2) in standing however this results only 1/10 symptoms that quickly resolves.  Reduced ball exercises to 1 time per day, corner exercises to 3 times per day, walking with head turns 15 minutes x2 per day.  Again reinforced with pt that blurry vision will only resolve with these habiutation exercises.  Pt verbalized understanding and stated "I will be better about it."             OT Education - 04/28/18 1036  Education Details  upgraded HEP for visual vestibular - see PN for details.     Person(s) Educated  Patient    Methods  Explanation;Demonstration;Verbal cues;Handout    Comprehension  Verbalized understanding;Returned demonstration          OT Long Term Goals - 04/28/18 1037      OT LONG TERM GOAL #1   Title  Pt will be mod I with HEP for coordination, visual vestibular exercises - 05/17/2018 (pt renewed as he had only attended 4 sessions as of 04/19/2018    Status  On-going      OT LONG TERM GOAL #2   Title  Pt will demonstrate improved coordination as evidenced by decreasing time on 9hole peg test for LUE by at least 8 seconds (baseline=45.63) to assist with ADL and IADL tasks    Status  Achieved   12/10 27.64      OT LONG TERM GOAL #3   Title  Pt will report no greater than 3/10 intensity for visual vestibular symptoms with horizontal head turns during home mgmt tasks.     Status  On-going      OT LONG TERM GOAL #4   Title  Pt will report greater ease in cutting food, writing and tying shoes    Status  Achieved      OT LONG TERM GOAL #5   Title  Pt will demonstrate environmental scanning during functional task with 90% accuracy for home mgmt and shopping tasks.     Status  Achieved            Plan - 04/28/18 1037    Clinical Impression Statement  Pt with good progress toward goals for functional use of L hand. Pt continues to experience visual vestibular disturbance with horizontal head turns.      Occupational Profile and client history currently impacting functional performance  PMH:  HTN, HLD, chronic kidney disease Stage II, tobacoo and alcohol use.  Multiple chronic brain infarcts, bilaterally.    Occupational performance deficits (Please refer to evaluation for details):  ADL's;IADL's;Work    Rehab Potential  Good    OT Frequency  2x / week    OT Duration  4 weeks    OT Treatment/Interventions  Self-care/ADL training;Therapeutic exercise;Neuromuscular education;DME and/or AE instruction;Therapeutic activities;Visual/perceptual remediation/compensation;Patient/family education    Plan  address vestilbular/balance, address L coordination, writing,     Consulted and Agree with Plan of Care  Patient       Patient will benefit from skilled therapeutic intervention in order to improve the following deficits and impairments:  Decreased cognition, Decreased coordination, Decreased safety awareness, Impaired UE functional use, Impaired vision/preception  Visit Diagnosis: Other lack of coordination  Visuospatial deficit    Problem List Patient Active Problem List   Diagnosis Date Noted  . Visual disturbance 04/26/2018  . Left hand weakness 04/26/2018  . Former smoker 03/22/2018  .  Gait disturbance, post-stroke   . Embolic cerebral infarction (HCC) 03/09/2018  . Dyslipidemia   . Cerebrovascular accident (CVA) (HCC)   . Left-sided weakness   . Benign essential HTN   . Diastolic dysfunction   . Leukocytosis   . Acute ischemic stroke (HCC) 03/05/2018  . CKD (chronic kidney disease), stage II 03/05/2018  . Alcohol dependence (HCC) 03/05/2018  . Essential hypertension 03/17/2017  . Abnormal kidney function 03/17/2017  . Hyperkalemia 03/17/2017  . Sleep disturbance 03/17/2017  . Sleep walking 03/17/2017  . Sebaceous cyst 03/17/2017    Norton Pastelulaski, Vipul Cafarelli Halliday, OTR/L 04/28/2018,  10:39 AM  [facility] [address] [place], [place], [address] Phone: [phone]   Fax:  [phone]  Name: Jesse Price MRN: [id number] Date of Birth: 14-Feb-1966

## 2018-04-28 NOTE — Therapy (Signed)
[facility] [address] [place], [place], [address] Phone: [phone]   Fax:  [phone]  Speech Language Pathology Treatment  Patient Details  Name: Jesse Price MRN: [id number] Date of Birth: 1965/06/21 Referring Provider (SLP): Dr. Claudette LawsAndrew Kirsteins   Encounter Date: 04/28/2018  End of Session - 04/28/18 1014    Visit Number  6    Number of Visits  13    Date for SLP Re-Evaluation  05/16/18    SLP Start Time  0931    SLP Stop Time   1012    SLP Time Calculation (min)  41 min    Activity Tolerance  Patient tolerated treatment well       Past Medical History:  Diagnosis Date  . Asthma   . Hypertension     Past Surgical History:  Procedure Laterality Date  . LOOP RECORDER INSERTION N/A 03/08/2018   Procedure: LOOP RECORDER INSERTION;  Surgeon: Marinus Mawaylor, Gregg W, MD;  [facility];  Service: Cardiovascular;  Laterality: N/A;  . TEE WITHOUT CARDIOVERSION N/A 03/08/2018   Procedure: TRANSESOPHAGEAL ECHOCARDIOGRAM (TEE);  Surgeon: Jake BatheSkains, Mark C, MD;  [facility];  Service: Cardiovascular;  Laterality: N/A;    There were no vitals filed for this visit.  Subjective Assessment - 04/28/18 0939    Subjective  "I brought in my blood pressure log, calendar I crossed off and I brought my questions list to the doctor"    Currently in Pain?  No/denies            ADULT SLP TREATMENT - 04/28/18 0939      General Information   Behavior/Cognition  Alert;Cooperative;Pleasant mood      Cognitive-Linquistic Treatment   Treatment focused on  Cognition    Skilled Treatment  Pt reports using his question list for MD appointment, however forgot to ask 1 question.  Pt utilized voice commands to add his follow up appointment with Dr. Aleen Campiysinger, with cues for correct time. After he was told 10:15, he set appointment for 8:00 with verbal cues for error awareness and to correct time. 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 Pt recalled prior ST session with rare min questioning cues. Generated written reminders for pt to follow up with cardiology, neurology and opthalmology appointments and to set them in his phone and calendar when they get scheduled.       Assessment / Recommendations / Plan   Plan  Continue with current plan of care      Progression Toward Goals   Progression toward goals  Progressing toward goals       SLP Education - 04/28/18 1012    Education Details  continue to use phone and calendar for medical apppointments; continue to monitor blood pressure and calendar, as well as sodium    Person(s) Educated  Patient    Methods  Explanation;Demonstration    Comprehension  Returned demonstration;Need further instruction;Verbalized understanding         SLP Long Term Goals - 04/28/18 1014      SLP LONG TERM GOAL #1   Title  Pt will utilize external aids for appointment and daily schedule management with rare min A over 2 sessions    Baseline  04/25/18; 04/28/18    Time  2    Period  Weeks    Status  Achieved      SLP LONG TERM GOAL #2   Title  Pt will complete daily blood pressure log with  mod I over 3 sessions    Baseline  04/25/18    Time  2    Period  Weeks    Status  On-going      SLP LONG TERM GOAL #3   Title  Pt will generate 4 compensatory strategies for attention, visual impairment and UE weakness for safety at work and driving  and report carryover of these over 3 sessions    Time  2    Period  Weeks    Status  On-going       Plan - 04/28/18 1013    Clinical Impression Statement  Jesse Price continues to require cues/training for safety awaress for possible return to work in future. He reports he is logging his BP daily and utilizing calendar consistently to manage his schedule; mod- max cues required today for awareness of deficits. Continue skilled ST to maximize carryover of compensations for memory and attention for safey and  possible return to work., as R hand improves.    Speech Therapy Frequency  2x / week    Duration  --   13 visits or 6 weeks   Treatment/Interventions  Compensatory strategies;Patient/family education;Functional tasks;Cognitive reorganization;Environmental controls;SLP instruction and feedback;Internal/external aids;Compensatory techniques;Language facilitation;Cueing hierarchy    Potential to Achieve Goals  Good       Patient will benefit from skilled therapeutic intervention in order to improve the following deficits and impairments:   Cognitive communication deficit    Problem List Patient Active Problem List   Diagnosis Date Noted  . Visual disturbance 04/26/2018  . Left hand weakness 04/26/2018  . Former smoker 03/22/2018  . Gait disturbance, post-stroke   . Embolic cerebral infarction (HCC) 03/09/2018  . Dyslipidemia   . Cerebrovascular accident (CVA) (HCC)   . Left-sided weakness   . Benign essential HTN   . Diastolic dysfunction   . Leukocytosis   . Acute ischemic stroke (HCC) 03/05/2018  . CKD (chronic kidney disease), stage II 03/05/2018  . Alcohol dependence (HCC) 03/05/2018  . Essential hypertension 03/17/2017  . Abnormal kidney function 03/17/2017  . Hyperkalemia 03/17/2017  . Sleep disturbance 03/17/2017  . Sleep walking 03/17/2017  . Sebaceous cyst 03/17/2017    , Radene JourneyLaura Ann MS, CCC-SLP 04/28/2018, 10:15 AM  [facility] [address] [place], [place], [address] Phone: [phone]   Fax:  [phone]   Name: Jesse Price MRN: [id number] Date of Birth: 1965/08/15

## 2018-05-02 ENCOUNTER — Ambulatory Visit: Payer: Commercial Managed Care - PPO | Admitting: Occupational Therapy

## 2018-05-02 ENCOUNTER — Ambulatory Visit: Payer: Commercial Managed Care - PPO | Admitting: Speech Pathology

## 2018-05-02 ENCOUNTER — Encounter: Payer: Self-pay | Admitting: Occupational Therapy

## 2018-05-02 DIAGNOSIS — R278 Other lack of coordination: Secondary | ICD-10-CM | POA: Diagnosis not present

## 2018-05-02 DIAGNOSIS — R41841 Cognitive communication deficit: Secondary | ICD-10-CM | POA: Diagnosis not present

## 2018-05-02 DIAGNOSIS — R41842 Visuospatial deficit: Secondary | ICD-10-CM

## 2018-05-02 DIAGNOSIS — I69818 Other symptoms and signs involving cognitive functions following other cerebrovascular disease: Secondary | ICD-10-CM

## 2018-05-02 DIAGNOSIS — R482 Apraxia: Secondary | ICD-10-CM

## 2018-05-02 NOTE — Therapy (Signed)
[facility] [address] [place], [place], [address] Phone: [phone]   Fax:  [phone]  Occupational Therapy Treatment  Patient Details  Name: Jesse Price MRN: [id number] Date of Birth: [birth year] Referring Provider (OT): Dr. Claudette LawsAndrew Kirsteins   Encounter Date: 05/02/2018  OT End of Session - 05/02/18 0845    Visit Number  8    Number of Visits  16    Date for OT Re-Evaluation  05/17/18    Authorization Type  [employer]    Authorization Time Period  20 visit limit pt has used 0/20 before today    Authorization - Visit Number  8    Authorization - Number of Visits  20    OT Start Time  0801    OT Stop Time  0841    OT Time Calculation (min)  40 min    Activity Tolerance  Patient tolerated treatment well       Past Medical History:  Diagnosis Date  . Asthma   . Hypertension     Past Surgical History:  Procedure Laterality Date  . LOOP RECORDER INSERTION N/A 03/08/2018   Procedure: LOOP RECORDER INSERTION;  Surgeon: Marinus Mawaylor, Gregg W, MD;  [facility];  Service: Cardiovascular;  Laterality: N/A;  . TEE WITHOUT CARDIOVERSION N/A 03/08/2018   Procedure: TRANSESOPHAGEAL ECHOCARDIOGRAM (TEE);  Surgeon: Jake BatheSkains, Mark C, MD;  [facility];  Service: Cardiovascular;  Laterality: N/A;    There were no vitals filed for this visit.  Subjective Assessment - 05/02/18 0804    Pertinent History  PMH:  Pt with R frontal CVA on 03/05/2018. Inpt rehab d/c home on 03/15/2018  multiple chronic infarcts bilaterally, tobacco, +ETOH use, HTN, HLD, chronic kidney disease Stage II    Patient Stated Goals  to get my hand working properly - I am left handed.     Currently in Pain?  No/denies                   OT Treatments/Exercises (OP) - 05/02/18 0001      ADLs   Work  Assessed ability for pt to climb up on ladder and complete work incorporating head turns (looking right and  then up and then look left and up) - pt asymptomatic and able to do mod I).  Also assessed pt's ability for bilateral carrying heavy items - pt able to carry up to 40 pounds for 100'.  Pt reports at work that anything over 10 pounds requires 2 people.      ADL Comments  Reassessed 9 hole peg and Box and Blocks.  Pt with score of 46 on Box and Blocks and 25.13 for 9 hole peg with L non dominant hand.       Neurological Re-education Exercises   Other Exercises 2  Neuro re ed to modify HEP for visual vestibular issues - see pt instruction for details. Pt able to return demonstrate after practice.               OT Education - 05/02/18 0843    Education Details  modified HEP for visual vestibular    Person(s) Educated  Patient    Methods  Explanation;Demonstration;Handout    Comprehension  Verbalized understanding;Returned demonstration          OT Long Term Goals - 05/02/18 0843      OT LONG TERM GOAL #1   Title  Pt will be mod I with HEP for coordination,  visual vestibular exercises - 05/17/2018 (pt renewed as he had only attended 4 sessions as of 04/19/2018    Status  On-going      OT LONG TERM GOAL #2   Title  Pt will demonstrate improved coordination as evidenced by decreasing time on 9hole peg test for LUE by at least 8 seconds (baseline=45.63) to assist with ADL and IADL tasks    Status  Achieved   12/10 27.64     OT LONG TERM GOAL #3   Title  Pt will report no greater than 3/10 intensity for visual vestibular symptoms with horizontal head turns during home mgmt tasks.     Status  Achieved      OT LONG TERM GOAL #4   Title  Pt will report greater ease in cutting food, writing and tying shoes    Status  Achieved      OT LONG TERM GOAL #5   Title  Pt will demonstrate environmental scanning during functional task with 90% accuracy for home mgmt and shopping tasks.     Status  Achieved            Plan - 05/02/18 0843    Clinical Impression Statement  Pt with good  progress toward goals. Pt remains mildly symptomatic for gaze stabilization with head turns (horizontal).    Occupational Profile and client history currently impacting functional performance  PMH:  HTN, HLD, chronic kidney disease Stage II, tobacoo and alcohol use.  Multiple chronic brain infarcts, bilaterally.    Occupational performance deficits (Please refer to evaluation for details):  ADL's;IADL's;Work    Rehab Potential  Good    OT Frequency  2x / week    OT Duration  4 weeks    OT Treatment/Interventions  Self-care/ADL training;Therapeutic exercise;Neuromuscular education;DME and/or AE instruction;Therapeutic activities;Visual/perceptual remediation/compensation;Patient/family education    Plan  place on hold and reassess visual vestibular in 4 weeks    Consulted and Agree with Plan of Care  Patient       Patient will benefit from skilled therapeutic intervention in order to improve the following deficits and impairments:  Decreased cognition, Decreased coordination, Decreased safety awareness, Impaired UE functional use, Impaired vision/preception  Visit Diagnosis: Other lack of coordination  Visuospatial deficit  Apraxia  Other symptoms and signs involving cognitive functions following other cerebrovascular disease    Problem List Patient Active Problem List   Diagnosis Date Noted  . Visual disturbance 04/26/2018  . Left hand weakness 04/26/2018  . Former smoker 03/22/2018  . Gait disturbance, post-stroke   . Embolic cerebral infarction (HCC) 03/09/2018  . Dyslipidemia   . Cerebrovascular accident (CVA) (HCC)   . Left-sided weakness   . Benign essential HTN   . Diastolic dysfunction   . Leukocytosis   . Acute ischemic stroke (HCC) 03/05/2018  . CKD (chronic kidney disease), stage II 03/05/2018  . Alcohol dependence (HCC) 03/05/2018  . Essential hypertension 03/17/2017  . Abnormal kidney function 03/17/2017  . Hyperkalemia 03/17/2017  . Sleep disturbance  03/17/2017  . Sleep walking 03/17/2017  . Sebaceous cyst 03/17/2017    Norton Pastelulaski, Kylan Veach Halliday, OTR/L 05/02/2018, 8:46 AM  [place] [facility] [address] [place], [place], [address] Phone: [phone]   Fax:  [phone]  Name: Jesse Price MRN: [id number] Date of Birth: 1965/10/26

## 2018-05-02 NOTE — Patient Instructions (Signed)
Do these two exercises at home:  1. [address]Circle [place] - large   5 circles.  Do 2 times per day (rated 1/10 with less than 8 seconds) 2. Head turns while walking - 5 minutes 3-4 times per day.  Walk about 15-20 feet.  Then stop in between and let symptoms die down (rated 3/10 with less than 10 seconds)

## 2018-05-02 NOTE — Therapy (Signed)
[facility] [address] [place], [place], [address] Phone: [phone]   Fax:  [phone]  Speech Language Pathology Treatment  Patient Details  Name: Jesse Price MRN: [id number] Date of Birth: [birth year] Referring Provider (SLP): Dr. Claudette LawsAndrew Kirsteins   Encounter Date: 05/02/2018  End of Session - 05/02/18 1257    Visit Number  7    Number of Visits  13    Date for SLP Re-Evaluation  05/16/18    SLP Start Time  0850    SLP Stop Time   0932    SLP Time Calculation (min)  42 min       Past Medical History:  Diagnosis Date  . Asthma   . Hypertension     Past Surgical History:  Procedure Laterality Date  . LOOP RECORDER INSERTION N/A 03/08/2018   Procedure: LOOP RECORDER INSERTION;  Surgeon: Marinus Mawaylor, Gregg W, MD;  [facility];  Service: Cardiovascular;  Laterality: N/A;  . TEE WITHOUT CARDIOVERSION N/A 03/08/2018   Procedure: TRANSESOPHAGEAL ECHOCARDIOGRAM (TEE);  Surgeon: Jake BatheSkains, Mark C, MD;  [facility];  Service: Cardiovascular;  Laterality: N/A;    There were no vitals filed for this visit.  Subjective Assessment - 05/02/18 0858    Subjective  "I'm driving to [place] but I know I ain't supposed to be driving."            ADULT SLP TREATMENT - 05/02/18 0850      General Information   Behavior/Cognition  Alert;Cooperative;Pleasant mood      Treatment Provided   Treatment provided  Cognitive-Linquistic      Cognitive-Linquistic Treatment   Treatment focused on  Cognition    Skilled Treatment  Pt told SLP he is planning to drive to [place]; SLP reinforced recommendation for pt not to drive and he stated, "I know that, but I'm gonna do what I do." Mod cues to state precautions/compensations for attention. Mod cues to set Google reminder about upcoming ST appointment. He reported he had not heard from schedulers re: scheduling referrals (3 appointments); question cues  to problem solve what he should do. Pt stated he would call to find out about scheduling after the session. When creating a packing list for his trip, pt required question cues to add items beyond pills, clothing. 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       Assessment / Recommendations / Plan   Plan  Continue with current plan of care      Progression Toward Goals   Progression toward goals  Progressing toward goals           SLP Long Term Goals - 05/02/18 0859      SLP LONG TERM GOAL #1   Title  Pt will utilize external aids for appointment and daily schedule management with rare min A over 2 sessions    Baseline  04/25/18; 04/28/18    Status  Achieved      SLP LONG TERM GOAL #2   Title  Pt will complete daily blood pressure log with mod I over 3 sessions    Baseline  04/25/18, 05/02/18    Time  1    Period  Weeks  Status  On-going      SLP LONG TERM GOAL #3   Title  Pt will generate 4 compensatory strategies for attention, visual impairment and UE weakness for safety at work and driving  and report carryover of these over 3 sessions    Time  1    Period  Weeks    Status  On-going       Plan - 05/02/18 1306    Clinical Impression Statement  Jesse Price continues to require cues/training for safety awaress for possible return to work in future. He reports he is logging his BP daily and utilizing calendar consistently to manage his schedule; mod- max cues required today for awareness of deficits. Continue skilled ST to maximize carryover of compensations for memory and attention for safey and possible return to work., as R hand improves.    Speech Therapy Frequency  2x / week     Duration  --   13 visits or 6 weeks      Patient will benefit from skilled therapeutic intervention in order to improve the following deficits and impairments:   Cognitive communication deficit    Problem List Patient Active Problem List   Diagnosis Date Noted  . Visual disturbance 04/26/2018  . Left hand weakness 04/26/2018  . Former smoker 03/22/2018  . Gait disturbance, post-stroke   . Embolic cerebral infarction (HCC) 03/09/2018  . Dyslipidemia   . Cerebrovascular accident (CVA) (HCC)   . Left-sided weakness   . Benign essential HTN   . Diastolic dysfunction   . Leukocytosis   . Acute ischemic stroke (HCC) 03/05/2018  . CKD (chronic kidney disease), stage II 03/05/2018  . Alcohol dependence (HCC) 03/05/2018  . Essential hypertension 03/17/2017  . Abnormal kidney function 03/17/2017  . Hyperkalemia 03/17/2017  . Sleep disturbance 03/17/2017  . Sleep walking 03/17/2017  . Sebaceous cyst 03/17/2017   Jesse BatonMary Beth Salimah Martinovich, MS, CCC-SLP Speech-Language Pathologist  Jesse Price  Jesse [place], [place], [address] Phone: [phone]   Fax:  [phone]   Name: Jesse Price

## 2018-05-09 LAB — CUP PACEART REMOTE DEVICE CHECK
Date Time Interrogation Session: 20191201150941
Implantable Pulse Generator Implant Date: 20191029

## 2018-05-10 ENCOUNTER — Ambulatory Visit: Payer: Commercial Managed Care - PPO | Admitting: Speech Pathology

## 2018-05-13 ENCOUNTER — Ambulatory Visit (INDEPENDENT_AMBULATORY_CARE_PROVIDER_SITE_OTHER): Payer: Commercial Managed Care - PPO

## 2018-05-13 DIAGNOSIS — I639 Cerebral infarction, unspecified: Secondary | ICD-10-CM | POA: Diagnosis not present

## 2018-05-13 LAB — CUP PACEART REMOTE DEVICE CHECK
Implantable Pulse Generator Implant Date: 20191029
MDC IDC SESS DTM: 20200103130904

## 2018-05-16 NOTE — Progress Notes (Signed)
[facility] Summary Report / Loop Recorder 

## 2018-05-23 ENCOUNTER — Ambulatory Visit: Payer: Commercial Managed Care - PPO | Admitting: Medical

## 2018-05-23 ENCOUNTER — Encounter: Payer: Self-pay | Admitting: Medical

## 2018-05-23 VITALS — BP 132/80 | HR 66 | Temp 98.2°F | Resp 16 | Ht 67.0 in | Wt 172.6 lb

## 2018-05-23 DIAGNOSIS — E785 Hyperlipidemia, unspecified: Secondary | ICD-10-CM

## 2018-05-23 DIAGNOSIS — H539 Unspecified visual disturbance: Secondary | ICD-10-CM

## 2018-05-23 DIAGNOSIS — N289 Disorder of kidney and ureter, unspecified: Secondary | ICD-10-CM | POA: Diagnosis not present

## 2018-05-23 DIAGNOSIS — I639 Cerebral infarction, unspecified: Secondary | ICD-10-CM | POA: Diagnosis not present

## 2018-05-23 DIAGNOSIS — N182 Chronic kidney disease, stage 2 (mild): Secondary | ICD-10-CM

## 2018-05-23 DIAGNOSIS — Z87891 Personal history of nicotine dependence: Secondary | ICD-10-CM

## 2018-05-23 DIAGNOSIS — I63421 Cerebral infarction due to embolism of right anterior cerebral artery: Secondary | ICD-10-CM

## 2018-05-23 DIAGNOSIS — I10 Essential (primary) hypertension: Secondary | ICD-10-CM

## 2018-05-23 LAB — BASIC METABOLIC PANEL
BUN / CREAT RATIO: 14 (ref 9–20)
BUN: 21 mg/dL (ref 6–24)
CHLORIDE: 104 mmol/L (ref 96–106)
CO2: 22 mmol/L (ref 20–29)
Calcium: 10.2 mg/dL (ref 8.7–10.2)
Creatinine, Ser: 1.55 mg/dL — ABNORMAL HIGH (ref 0.76–1.27)
GFR calc non Af Amer: 50 mL/min/{1.73_m2} — ABNORMAL LOW (ref >59)
GFR, EST AFRICAN AMERICAN: 58 mL/min/{1.73_m2} — AB (ref >59)
Glucose: 88 mg/dL (ref 65–99)
Potassium: 4.7 mmol/L (ref 3.5–5.2)
Sodium: 140 mmol/L (ref 134–144)

## 2018-05-23 MED ORDER — THIAMINE HCL 100 MG PO TABS: 100.0000 mg | ORAL_TABLET | Freq: Every day | ORAL | 0 refills | Status: DC

## 2018-05-23 MED ORDER — ASPIRIN 325 MG PO TABS: 325.0000 mg | ORAL_TABLET | Freq: Every day | ORAL | 2 refills | Status: AC

## 2018-05-23 MED ORDER — AMLODIPINE BESYLATE 5 MG PO TABS: 5.0000 mg | ORAL_TABLET | Freq: Every day | ORAL | 3 refills | Status: DC

## 2018-05-23 MED ORDER — ADULT MULTIVITAMIN W/MINERALS CH: 1.0000 | ORAL_TABLET | Freq: Every day | ORAL | 3 refills | Status: AC

## 2018-05-23 MED ORDER — FOLIC ACID 1 MG PO TABS: 1.0000 mg | ORAL_TABLET | Freq: Every day | ORAL | 0 refills | Status: DC

## 2018-05-23 MED ORDER — ATORVASTATIN CALCIUM 80 MG PO TABS: 80.0000 mg | ORAL_TABLET | Freq: Every day | ORAL | 3 refills | Status: AC

## 2018-05-23 NOTE — Progress Notes (Signed)
Subjective: Chief Complaint  Patient presents with  . follow up    1 month follow up   Here for recheck.   I saw him last on 04/26/18 for f/u.     He was hospitalized 03/06/2018 through 03/15/2018 for stroke.  He remains tobacco free since quitting recently from hospitalization.  He is compliant with his medications.  He suffered a stroke on March 05, 2018.    Has appt for eye doctor 07/01/2018.  He heard from cardiology and they advised him he didn't need f/u with them unless he was having problems.  Neurology office called him but said they are a month behind, and he has no specific appt yet.    He is left-handed.  He is a Psychologist, occupational.  He still is having difficulty with the left fourth and fifth fingers and strength of the left hand.  He is improved.  He thinks he could go back to work but with less hours given concerns for fatigue of fingers.   Still can't fully oppose some of his fingers, and still has weakness of some of those fingers.   Denies numbness, tingling.  Speech is good.    He still drinks several alcoholic beverages per weekend, generally a 40oz.  He denies any new symptoms since discharge.  Denies CP, dyspnea, SOB.       Past Medical History:  Diagnosis Date  . Asthma   . Hypertension    No current outpatient medications on file prior to visit.   No current facility-administered medications on file prior to visit.    ROS as in subjective   Objective: BP 132/80   Pulse 66   Temp 98.2 F (36.8 C) (Oral)   Resp 16   Ht 5\' 7"  (1.702 m)   Wt 172 lb 9.6 oz (78.3 kg)   SpO2 99%   BMI 27.03 kg/m   BP Readings from Last 3 Encounters:  05/23/18 132/80  04/26/18 130/78  04/21/18 (!) 135/94   Wt Readings from Last 3 Encounters:  05/23/18 172 lb 9.6 oz (78.3 kg)  04/26/18 177 lb 6.4 oz (80.5 kg)  03/24/18 178 lb (80.7 kg)     General appearance: alert, no distress, WD/WN, AA male Neck: supple, no lymphadenopathy, no thyromegaly, no masses, no bruits Heart:  RRR, normal S1, S2, no murmurs Lungs: CTA bilaterally, no wheezes, rhonchi, or rales Musculoskeletal: nontender, no swelling, no obvious deformity Extremities: no edema, no cyanosis, no clubbing Pulses: 2+ symmetric, upper and lower extremities, normal cap refill Neurological: alert, oriented x 3, CN2-12 intact, left arm /hand grip and finger strength is reduced and somewhat discoordinated with thumb to finger opposition, arm and leg strength in general 4-5/5 compared to 5/5 right side, otherwise strength normal upper extremities and lower extremities, sensation normal throughout, DTRs 2+ throughout, no cerebellar signs, gait normal Psychiatric: normal affect, behavior normal, pleasant      Assessment: Encounter Diagnoses  Name Primary?  . Essential hypertension Yes  . Abnormal kidney function   . CKD (chronic kidney disease), stage II   . Cerebrovascular accident (CVA), unspecified mechanism (HCC)   . Former smoker   . 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 Dyslipidemia   . Visual disturbance      Plan: He is doing better, improving with left hand strength and coordination.  He states he is not quite back ready to return to work as a Psychologist, occupational.  He has completed occupational therapy and  speech therapy.    For some reason we have had difficulty getting him an appointment with [facility].  We will call again today to make sure we have a scheduled appt. he still has specifically left fourth and fifth finger weakness and abnormality with coordination, still has some vision concerns.  He does not feel safe returning to work as a Psychologist, occupational just yet.  His work sometimes requires him to climb up on platforms he does not feel that is safe for him at this time.  He seems slightly improved from last visit with the left hand strength.  He is left-handed.  Continue current medications  Advised to limit alcohol,  counseled on healthy eating, walking for  exercise.    Alexes was seen today for follow up.  Diagnoses and all orders for this visit:  Essential hypertension -     Basic metabolic panel -     Multiple Vitamin (MULTIVITAMIN WITH MINERALS) TABS tablet; Take 1 tablet by mouth daily.  Abnormal kidney function -     Multiple Vitamin (MULTIVITAMIN WITH MINERALS) TABS tablet; Take 1 tablet by mouth daily.  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 -     folic acid (FOLVITE) 1 MG tablet; Take 1 tablet (1 mg total) by mouth daily. -     atorvastatin (LIPITOR) 80 MG tablet; Take 1 tablet (80 mg total) by mouth daily at 6 PM. -     amLODipine (NORVASC) 5 MG tablet; Take 1 tablet (5 mg total) by mouth daily. -     aspirin 325 MG tablet; Take 1 tablet (325 mg total) by mouth daily.

## 2018-05-24 ENCOUNTER — Other Ambulatory Visit: Payer: Self-pay | Admitting: Medical

## 2018-05-24 MED ORDER — OLMESARTAN MEDOXOMIL 5 MG PO TABS: 5.0000 mg | ORAL_TABLET | Freq: Every day | ORAL | 2 refills | Status: DC

## 2018-05-25 ENCOUNTER — Encounter: Payer: Self-pay | Admitting: Occupational Therapy

## 2018-05-25 ENCOUNTER — Ambulatory Visit: Payer: Commercial Managed Care - PPO | Admitting: Neurology

## 2018-05-25 ENCOUNTER — Ambulatory Visit: Payer: Commercial Managed Care - PPO | Attending: Medical | Admitting: Occupational Therapy

## 2018-05-25 ENCOUNTER — Encounter: Payer: Self-pay | Admitting: Neurology

## 2018-05-25 VITALS — BP 135/65 | HR 70 | Ht 67.0 in | Wt 175.0 lb

## 2018-05-25 DIAGNOSIS — R482 Apraxia: Secondary | ICD-10-CM | POA: Insufficient documentation

## 2018-05-25 DIAGNOSIS — I639 Cerebral infarction, unspecified: Secondary | ICD-10-CM

## 2018-05-25 DIAGNOSIS — R278 Other lack of coordination: Secondary | ICD-10-CM | POA: Insufficient documentation

## 2018-05-25 DIAGNOSIS — R41842 Visuospatial deficit: Secondary | ICD-10-CM

## 2018-05-25 DIAGNOSIS — I69818 Other symptoms and signs involving cognitive functions following other cerebrovascular disease: Secondary | ICD-10-CM | POA: Insufficient documentation

## 2018-05-25 NOTE — Patient Instructions (Signed)
I had a long d/w patient about his recent cryptogenic stroke, risk for recurrent stroke/TIAs, personally independently reviewed imaging studies and stroke evaluation results and answered questions.Continue aspirin 325 mg daily  for secondary stroke prevention and maintain strict control of hypertension with blood pressure goal below 130/90, diabetes with hemoglobin A1c goal below 6.5% and lipids with LDL cholesterol goal below 70 mg/dL. I also advised the patient to eat a healthy diet with plenty of whole grains, cereals, fruits and vegetables, exercise regularly and maintain ideal body weight. He may start driving again. He can return to work without restrictions starting Jun 11, 2018 . He may consider possible particpation in [place] stroke study if interested.Followup in the future with my nurse practitioner or call earlier if necessary.  Stroke Prevention Some medical conditions and behaviors are associated with a higher chance of having a stroke. You can help prevent a stroke by making nutrition, lifestyle, and other changes, including managing any medical conditions you may have. What nutrition changes can be made?   Eat healthy foods. You can do this by: ? Choosing foods high in fiber, such as fresh fruits and vegetables and whole grains. ? Eating at least 5 or more servings of fruits and vegetables a day. Try to fill half of your plate at each meal with fruits and vegetables. ? Choosing lean protein foods, such as lean cuts of meat, poultry without skin, fish, tofu, beans, and nuts. ? Eating low-fat dairy products. ? Avoiding foods that are high in salt (sodium). This can help lower blood pressure. ? Avoiding foods that have saturated fat, trans fat, and cholesterol. This can help prevent high cholesterol. ? Avoiding processed and premade foods.  Follow your health care provider's specific guidelines for losing weight, controlling high blood pressure (hypertension), lowering high cholesterol,  and managing diabetes. These may include: ? Reducing your daily calorie intake. ? Limiting your daily sodium intake to 1,500 milligrams (mg). ? Using only healthy fats for cooking, such as olive oil, canola oil, or sunflower oil. ? Counting your daily carbohydrate intake. What lifestyle changes can be made?  Maintain a healthy weight. Talk to your health care provider about your ideal weight.  Get at least 30 minutes of moderate physical activity at least 5 days a week. Moderate activity includes brisk walking, biking, and swimming.  Do not use any products that contain nicotine or tobacco, such as cigarettes and e-cigarettes. If you need help quitting, ask your health care provider. It may also be helpful to avoid exposure to secondhand smoke.  Limit alcohol intake to no more than 1 drink a day for nonpregnant women and 2 drinks a day for men. One drink equals 12 oz of beer, 5 oz of wine, or 1 oz of hard liquor.  Stop any illegal drug use.  Avoid taking birth control pills. Talk to your health care provider about the risks of taking birth control pills if: ? You are over 35 years old. ? You smoke. ? You get migraines. ? You have ever had a blood clot. What other changes can be made?  Manage your cholesterol levels. ? Eating a healthy diet is important for preventing high cholesterol. If cholesterol cannot be managed through diet alone, you may also need to take medicines. ? Take any prescribed medicines to control your cholesterol as told by your health care provider.  Manage your diabetes. ? Eating a healthy diet and exercising regularly are important parts of managing your blood sugar. If your blood  sugar cannot be managed through diet and exercise, you may need to take medicines. ? Take any prescribed medicines to control your diabetes as told by your health care provider.  Control your hypertension. ? To reduce your risk of stroke, try to keep your blood pressure below  130/80. ? Eating a healthy diet and exercising regularly are an important part of controlling your blood pressure. If your blood pressure cannot be managed through diet and exercise, you may need to take medicines. ? Take any prescribed medicines to control hypertension as told by your health care provider. ? Ask your health care provider if you should monitor your blood pressure at home. ? Have your blood pressure checked every year, even if your blood pressure is normal. Blood pressure increases with age and some medical conditions.  Get evaluated for sleep disorders (sleep apnea). Talk to your health care provider about getting a sleep evaluation if you snore a lot or have excessive sleepiness.  Take over-the-counter and prescription medicines only as told by your health care provider. Aspirin or blood thinners (antiplatelets or anticoagulants) may be recommended to reduce your risk of forming blood clots that can lead to stroke.  Make sure that any other medical conditions you have, such as atrial fibrillation or atherosclerosis, are managed. What are the warning signs of a stroke? The warning signs of a stroke can be easily remembered as BEFAST.  B is for balance. Signs include: ? Dizziness. ? Loss of balance or coordination. ? Sudden trouble walking.  E is for eyes. Signs include: ? A sudden change in vision. ? Trouble seeing.  F is for face. Signs include: ? Sudden weakness or numbness of the face. ? The face or eyelid drooping to one side.  A is for arms. Signs include: ? Sudden weakness or numbness of the arm, usually on one side of the body.  S is for speech. Signs include: ? Trouble speaking (aphasia). ? Trouble understanding.  T is for time. ? These symptoms may represent a serious problem that is an emergency. Do not wait to see if the symptoms will go away. Get medical help right away. Call your local emergency services (911 in the [place] Do not drive yourself to the  hospital.  Other signs of stroke may include: ? A sudden, severe headache with no known cause. ? Nausea or vomiting. ? Seizure. Where to find more information For more information, visit:  American Stroke Association: www.strokeassociation.org  National Stroke Association: www.stroke.org Summary  You can prevent a stroke by eating healthy, exercising, not smoking, limiting alcohol intake, and managing any medical conditions you may have.  Do not use any products that contain nicotine or tobacco, such as cigarettes and e-cigarettes. If you need help quitting, ask your health care provider. It may also be helpful to avoid exposure to secondhand smoke.  Remember BEFAST for warning signs of stroke. Get help right away if you or a loved one has any of these signs. This information is not intended to replace advice given to you by your health care provider. Make sure you discuss any questions you have with your health care provider. Document Released: 06/04/2004 Document Revised: 06/02/2016 Document Reviewed: 06/02/2016 Elsevier Interactive Patient Education  2019 [employer]

## 2018-05-25 NOTE — Progress Notes (Signed)
[facility] [address] [place]. [place] [id number] [phone]       OFFICE FOLLOW-UP NOTE  Jesse Price Date of Birth:  [birth year] Medical Record Number:  [id number]   HPI: Jesse Price is a 53 year old African-American male seen today for initial office follow-up visit following hospital admission for stroke in October 2019.  History is obtained from the patient and review of electronic medical records.  I personally reviewed imaging films in PACS and hospital stroke work-up.  He presented on 03/05/2018 with sudden onset of left arm and leg weakness at 5:30 PM the day prior.  He was too weak to move or even use the phone to call for help.  Lay on the ground for several hours till his wife returned the next day and brought him to the hospital.  Upon arrival he had NIH stroke scale of 8 with significant left arm and mild left face and leg weakness.  Noncontrast CT scan of the head showed a subtle hypoattenuation in the right frontal lobe adjacent to area of encephalomalacia however the patient had no prior known history of head injury or stroke to explain any old stroke.  MRI scan of the brain showed small early subacute right posterior middle frontal MCA branch infarct as well as multiple small chronic infarcts involving bilateral frontal lobes right parietal and left posterior temporal lobes.  Transthoracic echo showed normal ejection fraction.  Transesophageal echocardiogram also did not show cardiac source of aneurysm or PFO.  CT angiogram of the brain and neck showed no significant extra or intracranial carotid stenosis.  There is moderate stenosis noted of the right P2 segment of the posterior cerebral artery.  Urine drug screen was negative.  LDL cholesterol was borderline at 75 mg percent and hemoglobin A1c was 5.6.  Patient was started on aspirin and Plavix for 3 weeks and then changed to aspirin alone.  He has received outpatient physical occupational therapy and has  been discharged.  He is made good recovery in his left-sided strength but still has some lack of dexterity and mild weakness in the left hand.  When tired he occasionally drops objects from his left hand.  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  Patient is also not been driving and would like to drive.  He remains on aspirin which is tolerating well without bleeding or bruising.  His blood pressure appears to be well controlled and today it is 135/62.  He had a loop recorder inserted and upon last interrogation paroxysmal A. fib had not yet been found. He has no new complaints today.  ROS:   14 system review of systems is positive for hand weakness, lack of dexterity and all other systems negative  PMH:  Past Medical History:  Diagnosis Date  . Asthma   . Hypertension   . Stroke [facility](HCC)     Social History:  Social History   Socioeconomic History  . Marital status: Divorced    Spouse name: Not on file  . Number of children: Not on file  . Years of education: Not on file  . Highest education level: Not on file  Occupational History  . Not on file  Social Needs  . Financial resource strain: Not on file  . Food insecurity:    Worry: Not on file    Inability: Not on file  . Transportation needs:  Medical: Not on file    Non-medical: Not on file  Tobacco Use  . Smoking status: Former Smoker    Packs/day: 0.50    Types: Cigarettes  . Smokeless tobacco: Never Used  Substance and Sexual Activity  . Alcohol use: Yes    Comment: 12 pack per week  . Drug use: No  . Sexual activity: Not on file  Lifestyle  . Physical activity:    Days per week: Not on file    Minutes per session: Not on file  . Stress: Not on file  Relationships  . Social connections:    Talks on phone: Not on file    Gets together: Not on file    Attends religious service: Not on file    Active member of club or organization: Not on file     Attends meetings of clubs or organizations: Not on file    Relationship status: Not on file  . Intimate partner violence:    Fear of current or ex partner: Not on file    Emotionally abused: Not on file    Physically abused: Not on file    Forced sexual activity: Not on file  Other Topics Concern  . Not on file  Social History Narrative   Reside with sister in [place], [place] on [address]   Employment- welding/read blueprint   One daughter 54 y/o resides in [place]., second yr in college   One son, [address]     Medications:   Current Outpatient Medications on File Prior to Visit  Medication Sig Dispense Refill  . amLODipine (NORVASC) 5 MG tablet Take 1 tablet (5 mg total) by mouth daily. 90 tablet 3  . aspirin 325 MG tablet Take 1 tablet (325 mg total) by mouth daily. 30 tablet 2  . atorvastatin (LIPITOR) 80 MG tablet Take 1 tablet (80 mg total) by mouth daily at 6 PM. 90 tablet 3  . Multiple Vitamin (MULTIVITAMIN WITH MINERALS) TABS tablet Take 1 tablet by mouth daily. 90 tablet 3   No current facility-administered medications on file prior to visit.     Allergies:  No Known Allergies  Physical Exam General: well developed, well nourished middle-aged African-American male, seated, in no evident distress Head: head normocephalic and atraumatic.  Neck: supple with no carotid or supraclavicular bruits Cardiovascular: regular rate and rhythm, no murmurs Musculoskeletal: no deformity Skin:  no rash/petichiae Vascular:  Normal pulses all extremities Vitals:   05/25/18 1338  BP: 135/65  Pulse: 70   Neurologic Exam Mental Status: Awake and fully alert. Oriented to place and time. Recent and remote memory intact. Attention span, concentration and fund of knowledge appropriate. Mood and affect appropriate.  Cranial Nerves: Fundoscopic exam reveals sharp disc margins. Pupils equal, briskly reactive to light. Extraocular movements full without nystagmus. Visual fields full to  confrontation. Hearing intact. Facial sensation intact. Face, tongue, palate moves normally and symmetrically.  Motor: Normal bulk and tone. Normal strength in all tested extremity muscles.  Mild weakness of left grip.  Diminished fine finger movements in the left hand.  Orbits right over left upper extremity. Sensory.: intact to touch ,pinprick .position and vibratory sensation.  Coordination: Rapid alternating movements normal in all extremities. Finger-to-nose and heel-to-shin performed accurately bilaterally. Gait and Station: Arises from chair without difficulty. Stance is normal. Gait demonstrates normal stride length and balance . Able to heel, toe and tandem walk without difficulty.  Reflexes: 1+ and symmetric. Toes downgoing.   NIHSS  0 Modified Rankin  2   ASSESSMENT: 64 year pleasant African-American male with right MCA branch infarct of cryptogenic etiology in October 2019.  Vascular risk factors of hypertension and hyperlipidemia only.  Patient is doing extremely well with only minor lack of dexterity in the left hand.     PLAN: I had a long d/w patient about his recent cryptogenic stroke, risk for recurrent stroke/TIAs, personally independently reviewed imaging studies and stroke evaluation results and answered questions.Continue aspirin 325 mg daily  for secondary stroke prevention and maintain strict control of hypertension with blood pressure goal below 130/90, diabetes with hemoglobin A1c goal below 6.5% and lipids with LDL cholesterol goal below 70 mg/dL. I also advised the patient to eat a healthy diet with plenty of whole grains, cereals, fruits and vegetables, exercise regularly and maintain ideal body weight. He may start driving again. He can return to work without restrictions starting Jun 11, 2018 . He may consider possible particpation in [place] stroke study if interested.Followup in the future with my nurse practitioner or call earlier if necessary. Greater than 50% of  time during this 25 minute visit was spent on counseling,explanation of diagnosis og cryptogenic stroke, planning of further management, discussion with patient and family and coordination of care Delia Heady, MD  [facility] [address] [place], [place] [address]  Phone [phone] Fax [phone] Note: This document was prepared with digital dictation and possible smart phrase technology. Any transcriptional errors that result from this process are unintentional

## 2018-05-25 NOTE — Therapy (Signed)
[facility] [address] [place], [place], [address] Phone: [phone]   Fax:  [phone]  Occupational Therapy Treatment  Patient Details  Name: Jerame Hedding MRN: [id number] Date of Birth: 11-Nov-1965 Referring Provider (OT): Dr. Alysia Penna   Encounter Date: 05/25/2018  OT End of Session - 05/25/18 1049    Visit Number  9    Number of Visits  16    Authorization Type  [employer]    Authorization Time Period  20 visit limit pt has used 0/20 before today    Authorization - Visit Number  9    Authorization - Number of Visits  20    OT Start Time  1017    OT Stop Time  1045    OT Time Calculation (min)  28 min    Activity Tolerance  Patient tolerated treatment well    Behavior During Therapy  [facility] for tasks assessed/performed       Past Medical History:  Diagnosis Date  . Asthma   . Hypertension     Past Surgical History:  Procedure Laterality Date  . LOOP RECORDER INSERTION N/A 03/08/2018   Procedure: LOOP RECORDER INSERTION;  Surgeon: Evans Lance, MD;  Location: [facility];  Service: Cardiovascular;  Laterality: N/A;  . TEE WITHOUT CARDIOVERSION N/A 03/08/2018   Procedure: TRANSESOPHAGEAL ECHOCARDIOGRAM (TEE);  Surgeon: Jerline Pain, MD;  Location: [facility] ENDOSCOPY;  Service: Cardiovascular;  Laterality: N/A;    There were no vitals filed for this visit.  Subjective Assessment - 05/25/18 1021    Subjective   Patient reports vision continues to improve.  He is doing HEP to address visual-vestibular symptoms.  I still get dizzy but not as bad.      Pertinent History  PMH:  Pt with R frontal CVA on 03/05/2018. Inpt rehab d/c home on 03/15/2018  multiple chronic infarcts bilaterally, tobacco, +ETOH use, HTN, HLD, chronic kidney disease Stage II    Patient Stated Goals  to get my hand working properly - I am left handed.     Currently in Pain?  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  Patient indicates that he does not have to weld, there are many other jobs that he is capable of, and would be able to do.  He has not had this conversation with his employer.  Patient is not interested in pursuing additional OT at this time.       Neurological Re-education Exercises   Other Exercises 1  Reviewed HEP for dominant LUE coordination, and visual vestibular skills.  Patient is completing daily    Other Exercises 2  Neuromuscular reeducation to address in hand manipulation.  Patient with improved use of thumb, index and long finger, but needs overt cueing to sustain left hand activation - ring and pinky.  Patient dropping small items form ulnar side of hand.               OT Education - 05/25/18 1048    Education Details  Encouraged patient to speak  with neurology today at his appointment - regarding driving    Person(s) Educated  Patient    Methods  Explanation    Comprehension  Verbalized understanding          OT Long Term Goals - 05/25/18 1051      OT LONG TERM GOAL #1   Status  Achieved      OT LONG TERM GOAL #2   Title  Pt will demonstrate improved coordination as evidenced by decreasing time on 9hole peg test for LUE by at least 8 seconds (baseline=45.63) to assist with ADL and IADL tasks    Status  Achieved      OT LONG TERM GOAL #3   Title  Pt will report no greater than 3/10 intensity for visual vestibular symptoms with horizontal head turns during home mgmt tasks.     Status  Achieved      OT LONG TERM GOAL #4   Title  Pt will report greater ease in cutting food, writing and tying shoes    Status  Achieved      OT LONG TERM GOAL #5   Title  Pt will demonstrate  environmental scanning during functional task with 90% accuracy for home mgmt and shopping tasks.     Status  Achieved            Plan - 05/25/18 1049    Clinical Impression Statement  Patient has met goals for OT and wishes to stop further treatment.  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      Occupational Profile and client history currently impacting functional performance  PMH:  HTN, HLD, chronic kidney disease Stage II, tobacoo and alcohol use.  Multiple chronic brain infarcts, bilaterally.    Occupational performance deficits (Please refer to evaluation for details):  ADL's;IADL's;Work    Rehab Potential  Good    OT Frequency  2x / week    OT Duration  4 weeks    OT Treatment/Interventions  Self-care/ADL training;Therapeutic exercise;Neuromuscular education;DME and/or AE instruction;Therapeutic activities;Visual/perceptual remediation/compensation;Patient/family education    Plan  discharge    Clinical Decision Making  Limited treatment options, no task modification necessary    Consulted and Agree with Plan of Care  Patient       Patient will benefit from skilled therapeutic intervention in order to improve the following deficits and impairments:  Decreased cognition, Decreased coordination, Decreased safety awareness, Impaired UE functional use, Impaired vision/preception  Visit Diagnosis: Other lack of coordination  Visuospatial deficit  Apraxia  Other symptoms and signs involving cognitive functions following other cerebrovascular disease    Problem List Patient Active Problem List   Diagnosis Date Noted  . Visual disturbance 04/26/2018  . Left hand weakness 04/26/2018  . Former smoker 03/22/2018  . Gait disturbance, post-stroke   . Embolic cerebral infarction ([place]) 03/09/2018  . Dyslipidemia    . Cerebrovascular accident (CVA) ([place])   . Left-sided weakness   . Benign essential HTN   . Diastolic dysfunction   . Leukocytosis   . Acute ischemic stroke ([place]) 03/05/2018  . CKD (chronic kidney disease), stage II 03/05/2018  . Alcohol dependence ([place]) 03/05/2018  . Essential hypertension 03/17/2017  . Abnormal kidney function 03/17/2017  . Hyperkalemia 03/17/2017  . Sleep disturbance 03/17/2017  . Sleep walking 03/17/2017  . Sebaceous cyst 03/17/2017  OCCUPATIONAL  THERAPY DISCHARGE SUMMARY  Visits from Start of Care: 9  Current functional level related to goals / functional outcomes: Improved coordination, strength in dominant LUE, decreased visual vestibular dysfunction with horizontal head movements   Remaining deficits: visua vestibular mild impairment, LUE coordiantion / apraxia  Education / Equipment: HEP established Plan: Patient agrees to discharge.  Patient goals were partially met. Patient is being discharged due to meeting the stated rehab goals.  ?????      Mariah Milling, OTR/L 05/25/2018, 10:52 AM  [facility] [address] [place], [place], [address] Phone: [phone]   Fax:  [phone]  Name: Marquan Vokes MRN: [id number] Date of Birth: 08-02-1965

## 2018-06-15 ENCOUNTER — Ambulatory Visit (INDEPENDENT_AMBULATORY_CARE_PROVIDER_SITE_OTHER): Payer: Commercial Managed Care - PPO

## 2018-06-15 ENCOUNTER — Telehealth: Payer: Self-pay | Admitting: Medical

## 2018-06-15 DIAGNOSIS — I639 Cerebral infarction, unspecified: Secondary | ICD-10-CM

## 2018-06-15 NOTE — Telephone Encounter (Signed)
Given to [facility] in my purple folder to be completed and returned to me.

## 2018-06-15 NOTE — Telephone Encounter (Signed)
Received a request for medical records. Along with signed request was a form to be completed. Sending form back to Cindi as per office protocol. Please return from to [facility] to send with medical records.

## 2018-06-16 NOTE — Telephone Encounter (Signed)
Return form, SCAN

## 2018-06-17 LAB — CUP PACEART REMOTE DEVICE CHECK
Date Time Interrogation Session: 20200205153947
Implantable Pulse Generator Implant Date: 20191029

## 2018-06-17 NOTE — Telephone Encounter (Signed)
Records and form faxed

## 2018-06-24 NOTE — Progress Notes (Signed)
[facility] Summary Report / Loop Recorder 

## 2018-07-01 DIAGNOSIS — I69898 Other sequelae of other cerebrovascular disease: Secondary | ICD-10-CM | POA: Diagnosis not present

## 2018-07-01 DIAGNOSIS — H40023 Open angle with borderline findings, high risk, bilateral: Secondary | ICD-10-CM | POA: Diagnosis not present

## 2018-07-01 DIAGNOSIS — H2513 Age-related nuclear cataract, bilateral: Secondary | ICD-10-CM | POA: Diagnosis not present

## 2018-07-18 ENCOUNTER — Ambulatory Visit (INDEPENDENT_AMBULATORY_CARE_PROVIDER_SITE_OTHER): Payer: Commercial Managed Care - PPO | Admitting: *Deleted

## 2018-07-18 DIAGNOSIS — I639 Cerebral infarction, unspecified: Secondary | ICD-10-CM | POA: Diagnosis not present

## 2018-07-19 LAB — CUP PACEART REMOTE DEVICE CHECK
Date Time Interrogation Session: 20200309154147
Implantable Pulse Generator Implant Date: 20191029

## 2018-07-22 DIAGNOSIS — H2513 Age-related nuclear cataract, bilateral: Secondary | ICD-10-CM | POA: Diagnosis not present

## 2018-07-22 DIAGNOSIS — H40023 Open angle with borderline findings, high risk, bilateral: Secondary | ICD-10-CM | POA: Diagnosis not present

## 2018-07-22 DIAGNOSIS — I69898 Other sequelae of other cerebrovascular disease: Secondary | ICD-10-CM | POA: Diagnosis not present

## 2018-07-26 NOTE — Progress Notes (Signed)
[facility] Summary Report / Loop Recorder 

## 2018-07-28 DIAGNOSIS — Z0279 Encounter for issue of other medical certificate: Secondary | ICD-10-CM

## 2018-08-22 ENCOUNTER — Other Ambulatory Visit: Payer: Self-pay

## 2018-08-22 ENCOUNTER — Ambulatory Visit (INDEPENDENT_AMBULATORY_CARE_PROVIDER_SITE_OTHER): Payer: Commercial Managed Care - PPO | Admitting: *Deleted

## 2018-08-22 DIAGNOSIS — I639 Cerebral infarction, unspecified: Secondary | ICD-10-CM | POA: Diagnosis not present

## 2018-08-22 LAB — CUP PACEART REMOTE DEVICE CHECK
Date Time Interrogation Session: 20200411160944
Implantable Pulse Generator Implant Date: 20191029

## 2018-09-01 ENCOUNTER — Telehealth: Payer: Self-pay | Admitting: Neurology

## 2018-09-01 DIAGNOSIS — I69898 Other sequelae of other cerebrovascular disease: Secondary | ICD-10-CM | POA: Diagnosis not present

## 2018-09-01 DIAGNOSIS — H2513 Age-related nuclear cataract, bilateral: Secondary | ICD-10-CM | POA: Diagnosis not present

## 2018-09-01 DIAGNOSIS — H40023 Open angle with borderline findings, high risk, bilateral: Secondary | ICD-10-CM | POA: Diagnosis not present

## 2018-09-01 NOTE — Progress Notes (Signed)
[facility] Summary Report / Loop Recorder 

## 2018-09-01 NOTE — Telephone Encounter (Signed)
If patient calls back he needs to state what kind of paperwork he needs and whats it for.  Left vm for patient to call back and let phone room what kind of paper work he needs.

## 2018-09-01 NOTE — Telephone Encounter (Signed)
Pt requesting a call stating he would like to discuss paperwork for his job(did not wish to discuss with me)- also did not wish to have a vv stating he felt it wouldn't get the job done and doesn't have a email. Please advise

## 2018-09-07 ENCOUNTER — Ambulatory Visit: Payer: Commercial Managed Care - PPO | Admitting: Adult Health

## 2018-09-16 ENCOUNTER — Other Ambulatory Visit: Payer: Self-pay | Admitting: Medical

## 2018-09-22 ENCOUNTER — Ambulatory Visit (INDEPENDENT_AMBULATORY_CARE_PROVIDER_SITE_OTHER): Payer: Commercial Managed Care - PPO | Admitting: *Deleted

## 2018-09-22 ENCOUNTER — Other Ambulatory Visit: Payer: Self-pay

## 2018-09-22 DIAGNOSIS — I639 Cerebral infarction, unspecified: Secondary | ICD-10-CM | POA: Diagnosis not present

## 2018-09-22 LAB — CUP PACEART REMOTE DEVICE CHECK
Date Time Interrogation Session: 20200514184105
Implantable Pulse Generator Implant Date: 20191029

## 2018-09-28 NOTE — Progress Notes (Signed)
[facility] Summary Report / Loop Recorder 

## 2018-10-25 ENCOUNTER — Ambulatory Visit (INDEPENDENT_AMBULATORY_CARE_PROVIDER_SITE_OTHER): Payer: Commercial Managed Care - PPO | Admitting: *Deleted

## 2018-10-25 DIAGNOSIS — I639 Cerebral infarction, unspecified: Secondary | ICD-10-CM | POA: Diagnosis not present

## 2018-10-26 LAB — CUP PACEART REMOTE DEVICE CHECK
Date Time Interrogation Session: 20200616194007
Implantable Pulse Generator Implant Date: 20191029

## 2018-11-01 ENCOUNTER — Telehealth: Payer: Self-pay

## 2018-11-01 NOTE — Telephone Encounter (Signed)
Left vm for patient that his appt will be a mychart video visit due to COVID 19. I stated the link was text to his phone to create his account.

## 2018-11-02 NOTE — Telephone Encounter (Signed)
LEft vm for patient that appt tomorrow appt will be cancel. I stated because we are unable to get in contact with him to discuss appt being mychart video visit. I stated its not in office due to [place] 19. Left vm that he will need to r/s.

## 2018-11-02 NOTE — Telephone Encounter (Signed)
Patient has provided consent to virtual video visit and to file insurance for video visit appointment scheduled. °

## 2018-11-03 ENCOUNTER — Ambulatory Visit: Payer: Commercial Managed Care - PPO | Admitting: Adult Health

## 2018-11-03 ENCOUNTER — Other Ambulatory Visit: Payer: Self-pay

## 2018-11-03 NOTE — Progress Notes (Deleted)
[facility] [address] [place]. [place] [address] [phone]       FOLLOW-UP NOTE  Mr. Jesse LoseMichael Price Date of Birth:  [birth year] Medical Record Number:  [id number]   Virtual Visit via Video Note  I connected with Jesse Price on 11/03/18 at  9:45 AM EDT by a video enabled telemedicine application located remotely in my own home and verified that I am speaking with the correct person using two identifiers who was located at their own home.   I discussed the limitations of evaluation and management by telemedicine and the availability of in person appointments. The patient expressed understanding and agreed to proceed.     HPI:  11/03/18 VIRTUAL VISIT  Residual mild left hand dexterity weakness -continues to improve Continues on aspirin without bleeding or bruising Continues on Lipitor without side effects myalgias Blood pressure *** Loop recorder is not shown atrial fibrillation thus far       05/25/2018 initial visit: [name] is a 53 year old African-American male seen today for initial office follow-up visit following hospital admission for stroke in October 2019.  History is obtained from the patient and review of electronic medical records.  I personally reviewed imaging films in PACS and hospital stroke work-up.  He presented on 03/05/2018 with sudden onset of left arm and leg weakness at 5:30 PM the day prior.  He was too weak to move or even use the phone to call for help.  Lay on the ground for several hours till his wife returned the next day and brought him to the hospital.  Upon arrival he had NIH stroke scale of 8 with significant left arm and mild left face and leg weakness.  Noncontrast CT scan of the head showed a subtle hypoattenuation in the right frontal lobe adjacent to area of encephalomalacia however the patient had no prior known history of head injury or stroke to explain any old stroke.  MRI scan of the brain showed small early  subacute right posterior middle frontal MCA branch infarct as well as multiple small chronic infarcts involving bilateral frontal lobes right parietal and left posterior temporal lobes.  Transthoracic echo showed normal ejection fraction.  Transesophageal echocardiogram also did not show cardiac source of aneurysm or PFO.  CT angiogram of the brain and neck showed no significant extra or intracranial carotid stenosis.  There is moderate stenosis noted of the right P2 segment of the posterior cerebral artery.  Urine drug screen was negative.  LDL cholesterol was borderline at 75 mg percent and hemoglobin A1c was 5.6.  Patient was started on aspirin and Plavix for 3 weeks and then changed to aspirin alone.  He has received outpatient physical occupational therapy and has been discharged.  He is made good recovery in his left-sided strength but still has some lack of dexterity and mild weakness in the left hand.  When tired he occasionally drops objects from his left hand.  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  Patient is also not been driving and would like to drive.  He remains on aspirin which is tolerating well without bleeding or bruising.  His blood pressure appears to be well controlled and today it is 135/62.  He had a loop recorder inserted and upon last interrogation paroxysmal A. fib had not yet been found. He has no new complaints today.  ROS:   14 system review of  systems is positive for hand weakness, lack of dexterity and all other systems negative  PMH:  Past Medical History:  Diagnosis Date   Asthma    Hypertension    Stroke [facility])     Social History:  Social History   Socioeconomic History   Marital status: Divorced    Spouse name: Not on file   Number of children: Not on file   Years of education: Not on file   Highest education level: Not on file  Occupational History   Not on file  Social Needs     Financial resource strain: Not on file   Food insecurity    Worry: Not on file    Inability: Not on file   Transportation needs    Medical: Not on file    Non-medical: Not on file  Tobacco Use   Smoking status: Former Smoker    Packs/day: 0.50    Types: Cigarettes   Smokeless tobacco: Never Used  Substance and Sexual Activity   Alcohol use: Yes    Comment: 12 pack per week   Drug use: No   Sexual activity: Not on file  Lifestyle   Physical activity    Days per week: Not on file    Minutes per session: Not on file   Stress: Not on file  Relationships   Social connections    Talks on phone: Not on file    Gets together: Not on file    Attends religious service: Not on file    Active member of club or organization: Not on file    Attends meetings of clubs or organizations: Not on file    Relationship status: Not on file   Intimate partner violence    Fear of current or ex partner: Not on file    Emotionally abused: Not on file    Physically abused: Not on file    Forced sexual activity: Not on file  Other Topics Concern   Not on file  Social History Narrative   Reside with sister in [place], [place] on [address]   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 90 tablet 3   aspirin 325 MG tablet Take 1 tablet (325 mg total) by mouth daily. 30 tablet 2   atorvastatin (LIPITOR) 80 MG tablet Take 1 tablet (80 mg total) by mouth daily at 6 PM. 90 tablet 3   Multiple Vitamin (MULTIVITAMIN WITH MINERALS) TABS tablet Take 1 tablet by mouth daily. 90 tablet 3   olmesartan (BENICAR) 5 MG tablet Take 1 tablet by mouth once daily 30 tablet 1   No current facility-administered medications on file prior to  visit.     Allergies:  No Known Allergies  Physical Exam General: well developed, well nourished middle-aged African-American male, seated, in no evident distress Head: head normocephalic and atraumatic.  Neck: supple with no carotid or supraclavicular bruits Cardiovascular: regular rate and rhythm, no murmurs Musculoskeletal: no deformity Skin:  no rash/petichiae Vascular:  Normal pulses all extremities There were no vitals filed for this visit. Neurologic Exam Mental Status: Awake and fully alert. Oriented to place and time. Recent and remote memory intact. Attention span, concentration and fund of knowledge appropriate.  Mood and affect appropriate.  Cranial Nerves: Fundoscopic exam reveals sharp disc margins. Pupils equal, briskly reactive to light. Extraocular movements full without nystagmus. Visual fields full to confrontation. Hearing intact. Facial sensation intact. Face, tongue, palate moves normally and symmetrically.  Motor: Normal bulk and tone. Normal strength in all tested extremity muscles.  Mild weakness of left grip.  Diminished fine finger movements in the left hand.  Orbits right over left upper extremity. Sensory.: intact to touch ,pinprick .position and vibratory sensation.  Coordination: Rapid alternating movements normal in all extremities. Finger-to-nose and heel-to-shin performed accurately bilaterally. Gait and Station: Arises from chair without difficulty. Stance is normal. Gait demonstrates normal stride length and balance . Able to heel, toe and tandem walk without difficulty.  Reflexes: 1+ and symmetric. Toes downgoing.   NIHSS  0 Modified Rankin  2   ASSESSMENT: 89 year pleasant African-American male with right MCA branch infarct of cryptogenic etiology in October 2019.  Vascular risk factors of hypertension and hyperlipidemia only.  Patient is doing extremely well with only minor lack of dexterity in the left hand.     PLAN: I had a long d/w patient  about his recent cryptogenic stroke, risk for recurrent stroke/TIAs, personally independently reviewed imaging studies and stroke evaluation results and answered questions.Continue aspirin 325 mg daily  for secondary stroke prevention and maintain strict control of hypertension with blood pressure goal below 130/90, diabetes with hemoglobin A1c goal below 6.5% and lipids with LDL cholesterol goal below 70 mg/dL. I also advised the patient to eat a healthy diet with plenty of whole grains, cereals, fruits and vegetables, exercise regularly and maintain ideal body weight. He may start driving again. He can return to work without restrictions starting Jun 11, 2018 . He may consider possible particpation in [place] stroke study if interested.Followup in the future with my nurse practitioner or call earlier if necessary. Greater than 50% of time during this 25 minute visit was spent on counseling,explanation of diagnosis og cryptogenic stroke, planning of further management, discussion with patient and family and coordination of care Delia HeadyPramod Sethi, MD  [facility] [address] [place], [place] [id number]  Phone [phone] Fax [phone] Note: This document was prepared with digital dictation and possible smart phrase technology. Any transcriptional errors that result from this process are unintentional

## 2018-11-04 NOTE — Progress Notes (Signed)
[facility] Summary Report / Loop Recorder 

## 2018-11-28 ENCOUNTER — Telehealth: Payer: Self-pay | Admitting: Adult Health

## 2018-11-28 ENCOUNTER — Ambulatory Visit (INDEPENDENT_AMBULATORY_CARE_PROVIDER_SITE_OTHER): Payer: Commercial Managed Care - PPO | Admitting: *Deleted

## 2018-11-28 DIAGNOSIS — I639 Cerebral infarction, unspecified: Secondary | ICD-10-CM | POA: Diagnosis not present

## 2018-11-28 LAB — CUP PACEART REMOTE DEVICE CHECK
Date Time Interrogation Session: 20200719200624
Implantable Pulse Generator Implant Date: 20191029

## 2018-11-28 NOTE — Telephone Encounter (Signed)
I called patient and LVM regarding rescheduling his 7:45 am appt on 7/23//20 due to provider schedule time change. 7:45 am is no longer an available appt time. I requested patient call back to reschedule.

## 2018-12-01 ENCOUNTER — Ambulatory Visit: Payer: Commercial Managed Care - PPO | Admitting: Adult Health

## 2018-12-13 NOTE — Progress Notes (Signed)
[facility] Summary Report / Loop Recorder 

## 2018-12-30 ENCOUNTER — Ambulatory Visit (INDEPENDENT_AMBULATORY_CARE_PROVIDER_SITE_OTHER): Payer: Commercial Managed Care - PPO | Admitting: *Deleted

## 2018-12-30 DIAGNOSIS — I639 Cerebral infarction, unspecified: Secondary | ICD-10-CM

## 2018-12-31 ENCOUNTER — Other Ambulatory Visit: Payer: Self-pay | Admitting: Medical

## 2019-01-01 LAB — CUP PACEART REMOTE DEVICE CHECK
Date Time Interrogation Session: 20200821203904
Implantable Pulse Generator Implant Date: 20191029

## 2019-01-02 NOTE — Telephone Encounter (Signed)
Left message on voicemail for patient to schedule CPE.

## 2019-01-06 NOTE — Progress Notes (Signed)
[facility] Summary Report / Loop Recorder 

## 2019-02-01 ENCOUNTER — Ambulatory Visit (INDEPENDENT_AMBULATORY_CARE_PROVIDER_SITE_OTHER): Payer: Commercial Managed Care - PPO | Admitting: *Deleted

## 2019-02-01 DIAGNOSIS — I639 Cerebral infarction, unspecified: Secondary | ICD-10-CM | POA: Diagnosis not present

## 2019-02-02 LAB — CUP PACEART REMOTE DEVICE CHECK
Date Time Interrogation Session: 20200923214045
Implantable Pulse Generator Implant Date: 20191029

## 2019-02-07 NOTE — Progress Notes (Signed)
[facility] Summary Report / Loop Recorder 

## 2019-03-06 ENCOUNTER — Ambulatory Visit (INDEPENDENT_AMBULATORY_CARE_PROVIDER_SITE_OTHER): Payer: Commercial Managed Care - PPO | Admitting: *Deleted

## 2019-03-06 DIAGNOSIS — I639 Cerebral infarction, unspecified: Secondary | ICD-10-CM

## 2019-03-07 LAB — CUP PACEART REMOTE DEVICE CHECK
Date Time Interrogation Session: 20201026213656
Implantable Pulse Generator Implant Date: 20191029

## 2019-03-24 NOTE — Progress Notes (Signed)
[facility] Summary Report / Loop Recorder 

## 2019-04-09 LAB — CUP PACEART REMOTE DEVICE CHECK
Date Time Interrogation Session: 20201128173750
Implantable Pulse Generator Implant Date: 20191029

## 2019-04-10 ENCOUNTER — Ambulatory Visit (INDEPENDENT_AMBULATORY_CARE_PROVIDER_SITE_OTHER): Payer: Self-pay | Admitting: *Deleted

## 2019-04-10 DIAGNOSIS — I639 Cerebral infarction, unspecified: Secondary | ICD-10-CM

## 2019-05-03 NOTE — Progress Notes (Signed)
ILR remote 

## 2019-05-11 ENCOUNTER — Ambulatory Visit (INDEPENDENT_AMBULATORY_CARE_PROVIDER_SITE_OTHER): Payer: Self-pay | Admitting: *Deleted

## 2019-05-11 DIAGNOSIS — I639 Cerebral infarction, unspecified: Secondary | ICD-10-CM

## 2019-05-12 LAB — CUP PACEART REMOTE DEVICE CHECK
Date Time Interrogation Session: 20201231203407
Implantable Pulse Generator Implant Date: 20191029

## 2019-05-22 ENCOUNTER — Telehealth: Payer: Self-pay

## 2019-05-22 NOTE — Telephone Encounter (Signed)
Unable to speak  with patient to remind of missed remote transmission 

## 2019-06-08 ENCOUNTER — Other Ambulatory Visit: Payer: Self-pay | Admitting: Medical

## 2020-03-05 IMAGING — CT CT ANGIO NECK
2 of 8 series · 8 of 33 positions shown · IV contrast (APPLIED)
Comparison: 03/05/2018 MRI of the head.

CLINICAL DATA: 52 y/o  M; stroke for follow-up.

EXAM:
CT ANGIOGRAPHY HEAD AND NECK
TECHNIQUE: Multidetector CT imaging of the head and neck was performed using
the standard protocol during bolus administration of intravenous
contrast. Multiplanar CT image reconstructions and MIPs were
obtained to evaluate the vascular anatomy. Carotid stenosis
measurements (when applicable) are obtained utilizing NASCET
criteria, using the distal internal carotid diameter as the
denominator.
CONTRAST:  50mL [id number] IOPAMIDOL ([id number]) INJECTION 76%

[Series 5: cta neck/head · axial · 0.47mm/px · z∈[-184,-58]mm · 2 of 189 slices shown]
[im 63/189  soft-tissue]
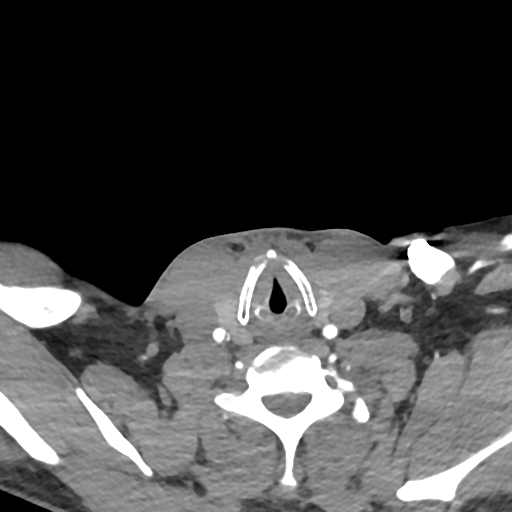
[im 126/189  soft-tissue]
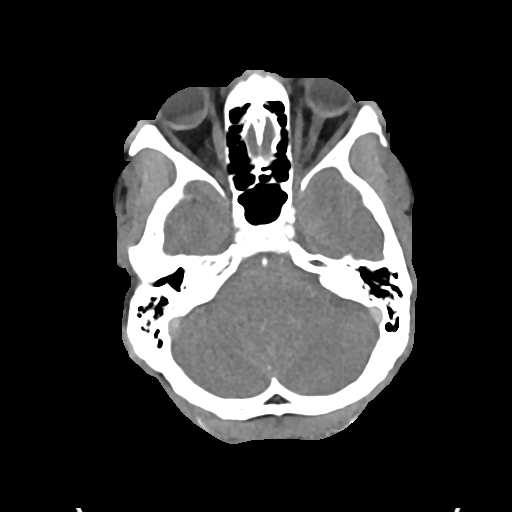

[Series 7: ax thins · axial · 0.39mm/px · z∈[-252,+17]mm · 6 of 377 slices shown]
[im 54/377  soft-tissue]
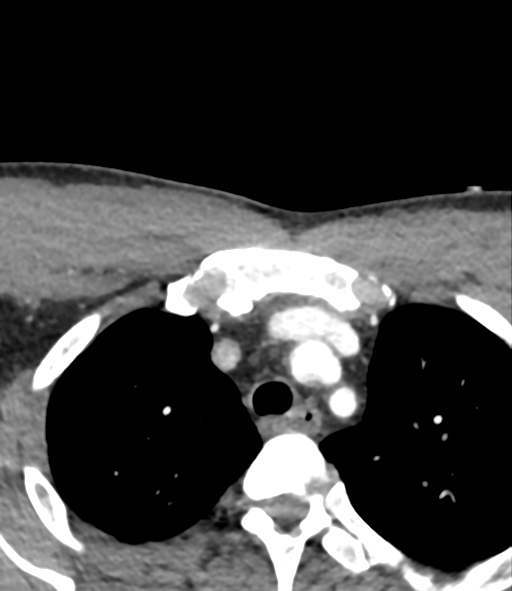
[im 108/377  bone]
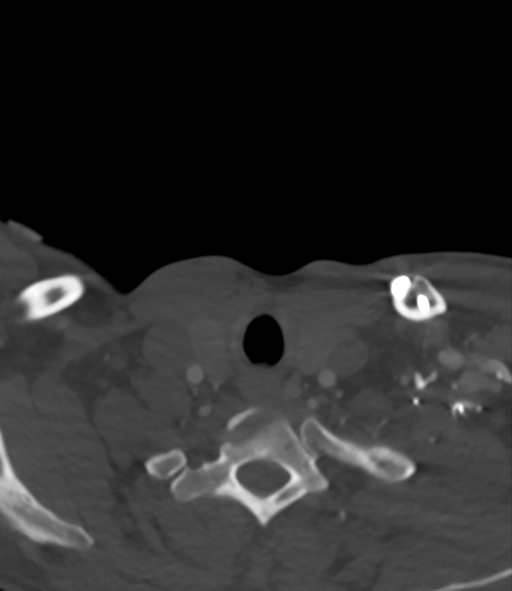
[im 162/377  soft-tissue]
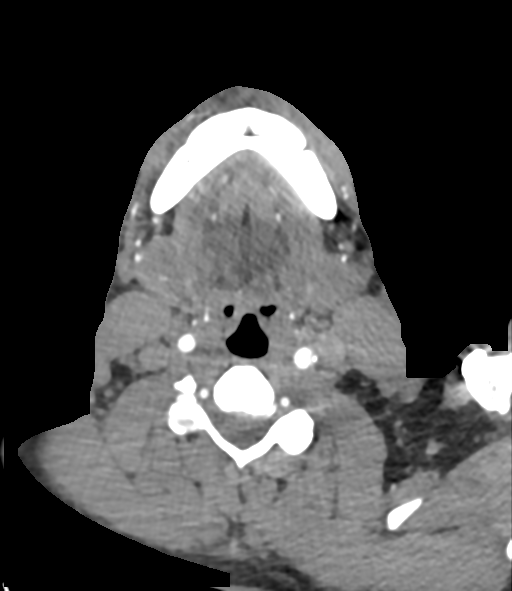
[im 215/377  bone]
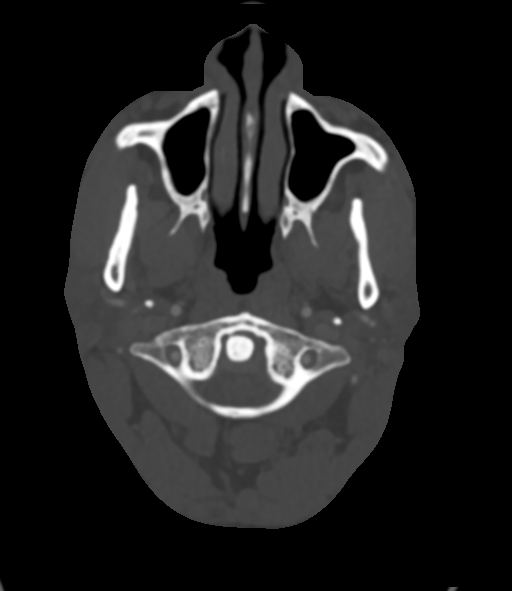
[im 269/377  soft-tissue]
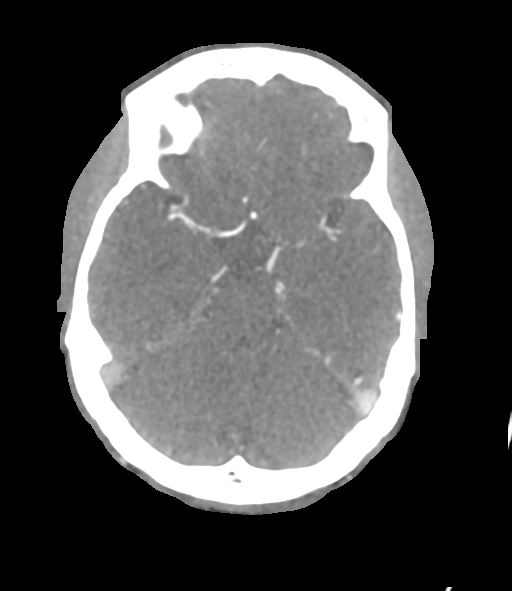
[im 323/377  bone]
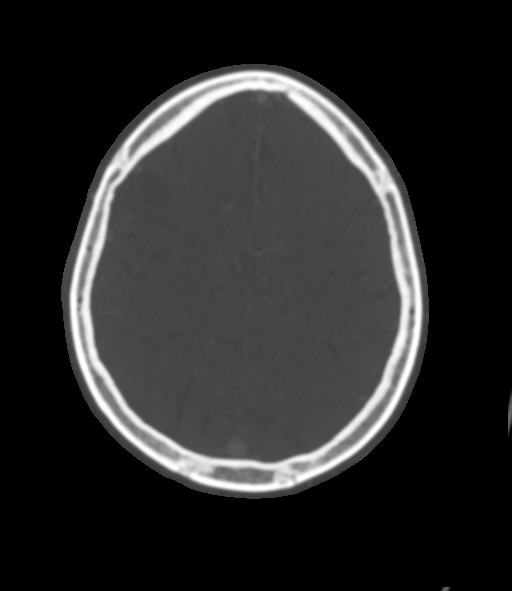

[8 of 33 positions shown; findings below may reference images not displayed]

FINDINGS: CTA NECK FINDINGS

Aortic arch: Bovine variant branching. Imaged portion shows no
evidence of aneurysm or dissection. No significant stenosis of the
major arch vessel origins.

Right carotid system: No evidence of dissection, stenosis (50% or
greater) or occlusion. Right carotid bifurcation mixed plaque
minimal less than 30% proximal ICA stenosis.

Left carotid system: No evidence of dissection, stenosis (50% or
greater) or occlusion. Left carotid bifurcation mixed plaque with
minimal less than 30% proximal ICA stenosis.

Vertebral arteries: Codominant. No evidence of dissection, stenosis
(50% or greater) or occlusion.

Skeleton: Negative.

Other neck: Negative.

Upper chest: Negative.

Review of the MIP images confirms the above findings

CTA HEAD FINDINGS

Anterior circulation: No significant stenosis, proximal occlusion,
aneurysm, or vascular malformation. Mild non stenotic calcific
atherosclerosis of carotid siphons.

Posterior circulation: No significant stenosis, proximal occlusion,
aneurysm, or vascular malformation. Moderate right P2 stenosis.

Venous sinuses: As permitted by contrast timing, patent.

Anatomic variants: Anterior communicating artery. No posterior
communicating artery identified, likely hypoplastic or absent.

Delayed phase: No abnormal intracranial enhancement.

Review of the MIP images confirms the above findings
IMPRESSION: 1. Patent carotid and vertebral arteries. No dissection, aneurysm,
or hemodynamically significant stenosis utilizing NASCET criteria.
2. Patent anterior and posterior intracranial circulation. No large
vessel occlusion, aneurysm, or high-grade stenosis.
3. Moderate right P2 PCA stenosis.
4. Non stenotic stenotic atherosclerosis of carotid bifurcations and
carotid siphons.

By: Jarga Esam M.D.

## 2021-06-02 ENCOUNTER — Telehealth: Payer: Self-pay | Admitting: Internal Medicine

## 2021-06-02 NOTE — Telephone Encounter (Signed)
LINQ information faxed

## 2021-06-02 NOTE — Telephone Encounter (Signed)
° °  Jesse Price with [facility] called, they needs serial # of pt's loop recorder, she said if we can fax them the last transmission received in 2021 to fax# [phone]

## 2022-01-14 ENCOUNTER — Encounter: Payer: Self-pay | Admitting: Internal Medicine

## 2022-02-17 ENCOUNTER — Encounter: Payer: Self-pay | Admitting: Internal Medicine
